# Patient Record
Sex: Female | Born: 1945 | Race: Black or African American | Hispanic: No | Smoking: Never smoker
Health system: Southern US, Community
[De-identification: ages and names within clinical notes are randomized; demographics above are authoritative.]

## PROBLEM LIST (undated history)

## (undated) DIAGNOSIS — E7800 Pure hypercholesterolemia, unspecified: Secondary | ICD-10-CM

## (undated) DIAGNOSIS — N763 Subacute and chronic vulvitis: Secondary | ICD-10-CM

## (undated) DIAGNOSIS — L209 Atopic dermatitis, unspecified: Secondary | ICD-10-CM

## (undated) DIAGNOSIS — IMO0002 Reserved for concepts with insufficient information to code with codable children: Secondary | ICD-10-CM

## (undated) DIAGNOSIS — D649 Anemia, unspecified: Secondary | ICD-10-CM

## (undated) DIAGNOSIS — Z5189 Encounter for other specified aftercare: Secondary | ICD-10-CM

## (undated) DIAGNOSIS — G930 Cerebral cysts: Secondary | ICD-10-CM

## (undated) DIAGNOSIS — G5600 Carpal tunnel syndrome, unspecified upper limb: Secondary | ICD-10-CM

## (undated) DIAGNOSIS — I10 Essential (primary) hypertension: Secondary | ICD-10-CM

## (undated) DIAGNOSIS — Z8619 Personal history of other infectious and parasitic diseases: Secondary | ICD-10-CM

## (undated) DIAGNOSIS — E669 Obesity, unspecified: Secondary | ICD-10-CM

## (undated) DIAGNOSIS — Z9889 Other specified postprocedural states: Secondary | ICD-10-CM

## (undated) DIAGNOSIS — Z87440 Personal history of urinary (tract) infections: Secondary | ICD-10-CM

## (undated) DIAGNOSIS — J13 Pneumonia due to Streptococcus pneumoniae: Secondary | ICD-10-CM

## (undated) HISTORY — DX: Reserved for concepts with insufficient information to code with codable children: IMO0002

## (undated) HISTORY — DX: Encounter for other specified aftercare: Z51.89

## (undated) HISTORY — DX: Cerebral cysts: G93.0

## (undated) HISTORY — DX: Pure hypercholesterolemia, unspecified: E78.00

## (undated) HISTORY — DX: Atopic dermatitis, unspecified: L20.9

## (undated) HISTORY — DX: Anemia, unspecified: D64.9

## (undated) HISTORY — DX: Personal history of other infectious and parasitic diseases: Z86.19

## (undated) HISTORY — DX: Personal history of urinary (tract) infections: Z87.440

## (undated) HISTORY — DX: Obesity, unspecified: E66.9

## (undated) HISTORY — DX: Subacute and chronic vulvitis: N76.3

## (undated) HISTORY — DX: Other specified postprocedural states: Z98.890

## (undated) HISTORY — PX: WRIST SURGERY: SHX841

## (undated) HISTORY — DX: Pneumonia due to Streptococcus pneumoniae: J13

---

## 2006-07-28 ENCOUNTER — Other Ambulatory Visit: Admission: RE | Admit: 2006-07-28 | Discharge: 2006-07-28 | Payer: Self-pay | Admitting: Obstetrics and Gynecology

## 2008-08-29 DIAGNOSIS — IMO0002 Reserved for concepts with insufficient information to code with codable children: Secondary | ICD-10-CM

## 2008-08-29 HISTORY — DX: Reserved for concepts with insufficient information to code with codable children: IMO0002

## 2008-12-16 HISTORY — PX: COLPOSCOPY: SHX161

## 2012-03-23 ENCOUNTER — Emergency Department (INDEPENDENT_AMBULATORY_CARE_PROVIDER_SITE_OTHER): Payer: No Typology Code available for payment source

## 2012-03-23 ENCOUNTER — Encounter (HOSPITAL_BASED_OUTPATIENT_CLINIC_OR_DEPARTMENT_OTHER): Payer: Self-pay | Admitting: *Deleted

## 2012-03-23 ENCOUNTER — Emergency Department (HOSPITAL_BASED_OUTPATIENT_CLINIC_OR_DEPARTMENT_OTHER)
Admission: EM | Admit: 2012-03-23 | Discharge: 2012-03-23 | Disposition: A | Discharge status: Home / Self Care | Payer: No Typology Code available for payment source | Attending: Emergency Medicine | Admitting: Emergency Medicine

## 2012-03-23 DIAGNOSIS — Z79899 Other long term (current) drug therapy: Secondary | ICD-10-CM | POA: Insufficient documentation

## 2012-03-23 DIAGNOSIS — J986 Disorders of diaphragm: Secondary | ICD-10-CM

## 2012-03-23 DIAGNOSIS — E789 Disorder of lipoprotein metabolism, unspecified: Secondary | ICD-10-CM | POA: Insufficient documentation

## 2012-03-23 DIAGNOSIS — R0789 Other chest pain: Secondary | ICD-10-CM

## 2012-03-23 DIAGNOSIS — R079 Chest pain, unspecified: Secondary | ICD-10-CM | POA: Insufficient documentation

## 2012-03-23 DIAGNOSIS — M129 Arthropathy, unspecified: Secondary | ICD-10-CM | POA: Insufficient documentation

## 2012-03-23 DIAGNOSIS — I517 Cardiomegaly: Secondary | ICD-10-CM

## 2012-03-23 HISTORY — DX: Pure hypercholesterolemia, unspecified: E78.00

## 2012-03-23 MED ORDER — HYDROCODONE-ACETAMINOPHEN 5-500 MG PO TABS: 1.0000 | ORAL_TABLET | Freq: Four times a day (QID) | ORAL | Status: AC | PRN

## 2012-03-23 MED ORDER — HYDROCODONE-ACETAMINOPHEN 5-325 MG PO TABS
1.0000 | ORAL_TABLET | Freq: Once | ORAL | Status: DC
  Filled 2012-03-23: qty 1
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
## 2012-03-23 NOTE — ED Provider Notes (Signed)
Medical screening examination/treatment/procedure(s) were performed by non-physician practitioner and as supervising physician I was immediately available for consultation/collaboration.  Toy Baker, MD 03/23/12 [phone]

## 2012-03-23 NOTE — Discharge Instructions (Signed)
Chest Wall Pain Chest wall pain is pain in or around the bones and muscles of your chest. It may take up to 6 weeks to get better. It may take longer if you must stay physically active in your work and activities.  CAUSES  Chest wall pain may happen on its own. However, it may be caused by:  A viral illness like the flu.   Injury.   Coughing.   Exercise.   Arthritis.   Fibromyalgia.   Shingles.  HOME CARE INSTRUCTIONS   Avoid overtiring physical activity. Try not to strain or perform activities that cause pain. This includes any activities using your chest or your abdominal and side muscles, especially if heavy weights are used.   Put ice on the sore area.   Put ice in a plastic bag.   Place a towel between your skin and the bag.   Leave the ice on for 15 to 20 minutes per hour while awake for the first 2 days.   Only take over-the-counter or prescription medicines for pain, discomfort, or fever as directed by your caregiver.  SEEK IMMEDIATE MEDICAL CARE IF:   Your pain increases, or you are very uncomfortable.   You have a fever.   Your chest pain becomes worse.   You have new, unexplained symptoms.   You have nausea or vomiting.   You feel sweaty or lightheaded.   You have a cough with phlegm (sputum), or you cough up blood.  MAKE SURE YOU:   Understand these instructions.   Will watch your condition.   Will get help right away if you are not doing well or get worse.  Document Released: 11/01/2005 Document Revised: 10/21/2011 Document Reviewed: 06/28/2011 [employer] Patient Information 2012 [employer]. 
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
## 2012-11-17 DIAGNOSIS — Z87440 Personal history of urinary (tract) infections: Secondary | ICD-10-CM | POA: Insufficient documentation

## 2012-11-17 DIAGNOSIS — L209 Atopic dermatitis, unspecified: Secondary | ICD-10-CM | POA: Insufficient documentation

## 2012-11-17 DIAGNOSIS — J13 Pneumonia due to Streptococcus pneumoniae: Secondary | ICD-10-CM | POA: Insufficient documentation

## 2012-11-17 DIAGNOSIS — E7800 Pure hypercholesterolemia, unspecified: Secondary | ICD-10-CM | POA: Insufficient documentation

## 2012-11-17 DIAGNOSIS — N763 Subacute and chronic vulvitis: Secondary | ICD-10-CM | POA: Insufficient documentation

## 2012-11-22 ENCOUNTER — Ambulatory Visit: Payer: Medicare Other | Admitting: Obstetrics and Gynecology

## 2012-11-23 ENCOUNTER — Ambulatory Visit: Payer: Medicare Other | Admitting: Obstetrics and Gynecology

## 2013-01-09 ENCOUNTER — Ambulatory Visit: Payer: Medicare Other | Admitting: Obstetrics and Gynecology

## 2013-01-09 ENCOUNTER — Encounter: Payer: Self-pay | Admitting: Obstetrics and Gynecology

## 2013-01-09 VITALS — BP 160/82 | Ht 65.0 in | Wt 301.0 lb

## 2013-01-09 DIAGNOSIS — Z87898 Personal history of other specified conditions: Secondary | ICD-10-CM

## 2013-01-09 NOTE — Progress Notes (Signed)
The patient is not taking hormone replacement therapy The patient  is taking a Calcium supplement. Post-menopausal bleeding:no  Last Pap:11/15/2011 Normal Last mammogram: per pt 10/2012 Normal Last DEXA scan : T= -0.2   11/15/2011 Last colonoscopy:normal per pt 2009 Repeat 10 years   Urinary symptoms: No Normal bowel movements: Yes but has bloating and gas Reports abuse at home: No:   Subjective:    Jenny Martin is a 67 y.o. female G1P1001 who presents for annual exam.  The patient has no complaints today.   The following portions of the patient's history were reviewed and updated as appropriate: allergies, current medications, past family history, past medical history, past social history, past surgical history and problem list.  Review of Systems Pertinent items are noted in HPI. Gastrointestinal:No change in bowel habits, no abdominal pain, no rectal bleeding Genitourinary:negative for dysuria, frequency, hematuria, nocturia and urinary incontinence    Objective:     BP 160/82  Ht 5\' 5"  (1.651 m)  Wt 301 lb (136.533 kg)  BMI 50.09 kg/m2 Wt Readings from Last 1 Encounters:  No data found for Wt     BMI: Body mass index is 50.09 kg/(m^2). General Appearance: Alert, appropriate appearance for age. No acute distress HEENT: Grossly normal Neck / Thyroid: Supple, no masses, nodes or enlargement Lungs: clear to auscultation bilaterally Back: No CVA tenderness Breast Exam: No masses or nodes.No dimpling, nipple retraction or discharge. Cardiovascular: Regular rate and rhythm. S1, S2, no murmur Gastrointestinal: Soft, non-tender, no masses or organomegaly Pelvic Exam: Vulva and vagina appear normal. Bimanual exam reveals normal uterus and adnexa. Rectovaginal: normal rectal, no masses Lymphatic Exam: Non-palpable nodes in neck, clavicular, axillary, or inguinal regions  Skin: no rash or abnormalities Neurologic: Normal gait and speech, no tremor  Psychiatric: Alert and  oriented, appropriate affect.   Assessment:    Normal gyn exam    Plan:   mammogram pap smear return annually or prn DEXA next year Visit with PCP for Weight Management   Silverio Lay MD

## 2013-07-20 ENCOUNTER — Encounter (HOSPITAL_BASED_OUTPATIENT_CLINIC_OR_DEPARTMENT_OTHER): Payer: Self-pay | Admitting: *Deleted

## 2013-07-20 ENCOUNTER — Emergency Department (HOSPITAL_BASED_OUTPATIENT_CLINIC_OR_DEPARTMENT_OTHER)
Admission: EM | Admit: 2013-07-20 | Discharge: 2013-07-20 | Disposition: A | Discharge status: Home / Self Care | Payer: Medicare Other | Attending: Emergency Medicine | Admitting: Emergency Medicine

## 2013-07-20 DIAGNOSIS — Z872 Personal history of diseases of the skin and subcutaneous tissue: Secondary | ICD-10-CM | POA: Insufficient documentation

## 2013-07-20 DIAGNOSIS — Z87448 Personal history of other diseases of urinary system: Secondary | ICD-10-CM | POA: Insufficient documentation

## 2013-07-20 DIAGNOSIS — Z8619 Personal history of other infectious and parasitic diseases: Secondary | ICD-10-CM | POA: Insufficient documentation

## 2013-07-20 DIAGNOSIS — Z79899 Other long term (current) drug therapy: Secondary | ICD-10-CM | POA: Insufficient documentation

## 2013-07-20 DIAGNOSIS — Z8639 Personal history of other endocrine, nutritional and metabolic disease: Secondary | ICD-10-CM | POA: Insufficient documentation

## 2013-07-20 DIAGNOSIS — R21 Rash and other nonspecific skin eruption: Secondary | ICD-10-CM | POA: Insufficient documentation

## 2013-07-20 DIAGNOSIS — Z862 Personal history of diseases of the blood and blood-forming organs and certain disorders involving the immune mechanism: Secondary | ICD-10-CM | POA: Insufficient documentation

## 2013-07-20 DIAGNOSIS — E669 Obesity, unspecified: Secondary | ICD-10-CM | POA: Insufficient documentation

## 2013-07-20 DIAGNOSIS — Z8701 Personal history of pneumonia (recurrent): Secondary | ICD-10-CM | POA: Insufficient documentation

## 2013-07-20 MED ORDER — HYDROCORTISONE 0.5 % EX CREA: TOPICAL_CREAM | Freq: Two times a day (BID) | CUTANEOUS | Status: DC

## 2013-07-20 MED ORDER — CEPHALEXIN 250 MG PO CAPS: 250.0000 mg | ORAL_CAPSULE | Freq: Four times a day (QID) | ORAL | Status: DC

## 2013-07-20 NOTE — ED Provider Notes (Signed)
CSN: [id number]     Arrival date & time 07/20/13  1527 History   First MD Initiated Contact with Patient 07/20/13 1531     Chief Complaint  Patient presents with  . Rash   (Consider location/radiation/quality/duration/timing/severity/associated sxs/prior Treatment) HPI Patient presents to the emergency room with complaints of a rash that started about a day ago on her left forehead above her eye. She states that it is mildly uncomfortable but not particularly painful. She deniesAny itching. She has not any trouble with with fever or visual disturbances. She denies any drainage. Patient is not aware of any new medications or creams. She denies any recent hair treatments.   The patient was concerned about this and she is being a serious infection so she came to the emergency room.  She denies any other systemic symptoms such as fevers, chills, shortness of breath, myalgias or arthralgias. She has not had any recent trips or travel. Past Medical History  Diagnosis Date  . High cholesterol   . Blood transfusion without reported diagnosis   . Anemia   . H/O bladder infections   . Pneumonia, pneumococcal   . Atopic dermatitis   . Chronic vulvitis   . Hypercholesterolemia   . S/P thoracotomy   . Arachnoid cyst   . History of chicken pox   . Obese   . Abnormal Pap smear 08/29/2008   Past Surgical History  Procedure Laterality Date  . Wrist surgery    . Colposcopy  12/2008    normal   Family History  Problem Relation Age of Onset  . COPD Mother   . Stroke Father    History  Substance Use Topics  . Smoking status: Never Smoker   . Smokeless tobacco: Never Used  . Alcohol Use: No   OB History   Grav Para Term Preterm Abortions TAB SAB Ect Mult Living   1 1 1       1      Review of Systems  All other systems reviewed and are negative.    Allergies  Ibuprofen  Home Medications   Current Outpatient Rx  Name  Route  Sig  Dispense  Refill  . acetaminophen (TYLENOL) 500 MG  tablet   Oral   Take 500 mg by mouth every 6 (six) hours as needed. For pain         . aspirin 325 MG tablet   Oral   Take 325 mg by mouth once. When felt chest tightness         . Calcium Citrate-Vitamin D (CITRACAL/VITAMIN D PO)   Oral   Take 2 tablets by mouth daily.         . cephALEXin (KEFLEX) 250 MG capsule   Oral   Take 1 capsule (250 mg total) by mouth 4 (four) times daily.   28 capsule   0   . hydrocortisone cream 0.5 %   Topical   Apply topically 2 (two) times daily.   30 g   0   . KRILL OIL OMEGA-3 PO   Oral   Take 1 tablet by mouth.         Marland Kitchen PRESCRIPTION MEDICATION   Inhalation   Inhale 2 puffs into the lungs. For SOB from bronchitis  (2 unknown inhalers- sample packs from MD)          BP 169/84  Pulse 77  Temp(Src) 97.9 F (36.6 C) (Oral)  Resp 20  Ht 5\' 6"  (1.676 m)  Wt 300 lb (  136.079 kg)  BMI 48.44 kg/m2  SpO2 98% Physical Exam  Nursing note and vitals reviewed. Constitutional: She appears well-developed and well-nourished. No distress.  HENT:  Head: Normocephalic and atraumatic.    Right Ear: External ear normal.  Left Ear: External ear normal.  Macular rash in the left forehead, no vesicles noted  Eyes: Conjunctivae are normal. Right eye exhibits no discharge. Left eye exhibits no discharge. No scleral icterus.  Neck: Neck supple. No tracheal deviation present.  Cardiovascular: Normal rate and regular rhythm.   No murmur heard. Pulmonary/Chest: Effort normal and breath sounds normal. No stridor. No respiratory distress. She has no wheezes.  Musculoskeletal: She exhibits no tenderness.  Neurological: She is alert. Cranial nerve deficit: no gross deficits.  Skin: Skin is warm and dry. Rash noted. No purpura noted. Rash is macular. Rash is not papular, not nodular, not pustular, not vesicular and not urticarial. There is erythema.  Psychiatric: She has a normal mood and affect.    ED Course  Procedures (including critical  care time) Labs Review Labs Reviewed - No data to display Imaging review No results found.  MDM   1. Rash    patient appears well and nontoxic,  this does not appear to be consistent with shingles. Possible she could have an early cellulitis although the patient is not having any significant pain. Atopic dermatitis but this isn't an unusual location for her. We'll have her take anti-inflammatory cream, hydrocortisone and have her take a course of Keflex for the possibility of an early cellulitis. Patient is instructed to monitor for blistering lesions. She is to followup with her doctor next week    Celene Kras, MD 07/20/13 [phone]

## 2013-07-20 NOTE — ED Notes (Signed)
Pt c/o rash to left side of face above left eye x 2 days

## 2013-12-27 ENCOUNTER — Encounter (HOSPITAL_BASED_OUTPATIENT_CLINIC_OR_DEPARTMENT_OTHER): Payer: Self-pay | Admitting: Emergency Medicine

## 2013-12-27 ENCOUNTER — Emergency Department (HOSPITAL_BASED_OUTPATIENT_CLINIC_OR_DEPARTMENT_OTHER)
Admission: EM | Admit: 2013-12-27 | Discharge: 2013-12-27 | Disposition: A | Discharge status: Home / Self Care | Payer: Medicare Other | Attending: Emergency Medicine | Admitting: Emergency Medicine

## 2013-12-27 ENCOUNTER — Emergency Department (HOSPITAL_BASED_OUTPATIENT_CLINIC_OR_DEPARTMENT_OTHER): Payer: Medicare Other

## 2013-12-27 DIAGNOSIS — IMO0002 Reserved for concepts with insufficient information to code with codable children: Secondary | ICD-10-CM | POA: Insufficient documentation

## 2013-12-27 DIAGNOSIS — Z872 Personal history of diseases of the skin and subcutaneous tissue: Secondary | ICD-10-CM | POA: Insufficient documentation

## 2013-12-27 DIAGNOSIS — Z8742 Personal history of other diseases of the female genital tract: Secondary | ICD-10-CM | POA: Insufficient documentation

## 2013-12-27 DIAGNOSIS — Z8701 Personal history of pneumonia (recurrent): Secondary | ICD-10-CM | POA: Insufficient documentation

## 2013-12-27 DIAGNOSIS — J069 Acute upper respiratory infection, unspecified: Secondary | ICD-10-CM

## 2013-12-27 DIAGNOSIS — Z9889 Other specified postprocedural states: Secondary | ICD-10-CM | POA: Insufficient documentation

## 2013-12-27 DIAGNOSIS — Z79899 Other long term (current) drug therapy: Secondary | ICD-10-CM | POA: Insufficient documentation

## 2013-12-27 DIAGNOSIS — E7800 Pure hypercholesterolemia, unspecified: Secondary | ICD-10-CM | POA: Insufficient documentation

## 2013-12-27 DIAGNOSIS — E669 Obesity, unspecified: Secondary | ICD-10-CM | POA: Insufficient documentation

## 2013-12-27 DIAGNOSIS — Z862 Personal history of diseases of the blood and blood-forming organs and certain disorders involving the immune mechanism: Secondary | ICD-10-CM | POA: Insufficient documentation

## 2013-12-27 DIAGNOSIS — Z87440 Personal history of urinary (tract) infections: Secondary | ICD-10-CM | POA: Insufficient documentation

## 2013-12-27 DIAGNOSIS — Z8619 Personal history of other infectious and parasitic diseases: Secondary | ICD-10-CM | POA: Insufficient documentation

## 2013-12-27 HISTORY — DX: Acute upper respiratory infection, unspecified: J06.9

## 2013-12-27 MED ORDER — AZITHROMYCIN 250 MG PO TABS: ORAL_TABLET | ORAL | Status: DC

## 2013-12-27 NOTE — ED Notes (Signed)
Pt amb to room 5 with quick steady gait in nad. Pt reports cough and congestion x 1 week. Pt states she has had surgery on her left lung in the past, and is concerned that she may have a pneumonia. Pt states her cough produces yellow sputum. Pt reports some subjective temps over the last week off and on. Pt states that she took a course of amoxicillin a week ago for "a stye on my right eyelid."

## 2013-12-27 NOTE — ED Provider Notes (Signed)
CSN: [id number]     Arrival date & time 12/27/13  4098 History   First MD Initiated Contact with Patient 12/27/13 [phone]     Chief Complaint  Patient presents with  . Nasal Congestion  . Cough     (Consider location/radiation/quality/duration/timing/severity/associated sxs/prior Treatment) Patient is a 68 y.o. female presenting with URI. The history is provided by the patient.  URI Presenting symptoms: congestion, cough, rhinorrhea and sore throat (mild, now gone)   Presenting symptoms: no fatigue and no fever   Congestion:    Location:  Nasal Cough:    Cough characteristics:  Productive   Sputum characteristics:  Yellow   Severity:  Mild   Onset quality:  Gradual   Duration:  1 week   Timing:  Constant   Progression:  Unchanged   Chronicity:  New Severity:  Mild Associated symptoms: no headaches and no neck pain     Past Medical History  Diagnosis Date  . High cholesterol   . Blood transfusion without reported diagnosis   . Anemia   . H/O bladder infections   . Pneumonia, pneumococcal   . Atopic dermatitis   . Chronic vulvitis   . Hypercholesterolemia   . S/P thoracotomy   . Arachnoid cyst   . History of chicken pox   . Obese   . Abnormal Pap smear 08/29/2008   Past Surgical History  Procedure Laterality Date  . Wrist surgery    . Colposcopy  12/2008    normal   Family History  Problem Relation Age of Onset  . COPD Mother   . Stroke Father    History  Substance Use Topics  . Smoking status: Never Smoker   . Smokeless tobacco: Never Used  . Alcohol Use: No   OB History   Grav Para Term Preterm Abortions TAB SAB Ect Mult Living   1 1 1       1      Review of Systems  Constitutional: Negative for fever and fatigue.  HENT: Positive for congestion, rhinorrhea and sore throat (mild, now gone). Negative for drooling.   Eyes: Negative for pain.  Respiratory: Positive for cough. Negative for shortness of breath.   Cardiovascular: Negative for chest pain.   Gastrointestinal: Negative for nausea, vomiting, abdominal pain and diarrhea.  Genitourinary: Negative for dysuria and hematuria.  Musculoskeletal: Negative for back pain, gait problem and neck pain.  Skin: Negative for color change.  Neurological: Negative for dizziness and headaches.  Hematological: Negative for adenopathy.  Psychiatric/Behavioral: Negative for behavioral problems.  All other systems reviewed and are negative.      Allergies  Ibuprofen  Home Medications   Current Outpatient Rx  Name  Route  Sig  Dispense  Refill  . acetaminophen (TYLENOL) 500 MG tablet   Oral   Take 500 mg by mouth every 6 (six) hours as needed. For pain         . aspirin 325 MG tablet   Oral   Take 325 mg by mouth once. When felt chest tightness         . Calcium Citrate-Vitamin D (CITRACAL/VITAMIN D PO)   Oral   Take 2 tablets by mouth daily.         . cephALEXin (KEFLEX) 250 MG capsule   Oral   Take 1 capsule (250 mg total) by mouth 4 (four) times daily.   28 capsule   0   . hydrocortisone cream 0.5 %   Topical   Apply topically 2 (  two) times daily.   30 g   0   . KRILL OIL OMEGA-3 PO   Oral   Take 1 tablet by mouth.         Marland Kitchen PRESCRIPTION MEDICATION   Inhalation   Inhale 2 puffs into the lungs. For SOB from bronchitis  (2 unknown inhalers- sample packs from MD)          BP 160/66  Pulse 69  Temp(Src) 98.6 F (37 C) (Oral)  Resp 18  Ht 5\' 5"  (1.651 m)  Wt 280 lb (127.007 kg)  BMI 46.59 kg/m2  SpO2 98% Physical Exam  Nursing note and vitals reviewed. Constitutional: She is oriented to person, place, and time. She appears well-developed and well-nourished.  HENT:  Head: Normocephalic.  Mouth/Throat: Oropharynx is clear and moist. No oropharyngeal exudate.  No cervical adenopathy noted.   Eyes: Conjunctivae and EOM are normal. Pupils are equal, round, and reactive to light.  Neck: Normal range of motion. Neck supple.  Cardiovascular: Normal rate,  regular rhythm, normal heart sounds and intact distal pulses.  Exam reveals no gallop and no friction rub.   No murmur heard. Pulmonary/Chest: Effort normal and breath sounds normal. No respiratory distress. She has no wheezes.  Abdominal: Soft. Bowel sounds are normal. There is no tenderness. There is no rebound and no guarding.  Musculoskeletal: Normal range of motion. She exhibits edema (mild non-pitting edema in bilateral LE's). She exhibits no tenderness.  Neurological: She is alert and oriented to person, place, and time.  Skin: Skin is warm and dry.  Psychiatric: She has a normal mood and affect. Her behavior is normal.    ED Course  Procedures (including critical care time) Labs Review Labs Reviewed - No data to display Imaging Review Dg Chest 2 View  12/27/2013   CLINICAL DATA:  Cough.  EXAM: CHEST  2 VIEW  COMPARISON:  Mar 23, 2012.  FINDINGS: Stable cardiomegaly. Stable elevation of left hemidiaphragm. No pleural effusion or pneumothorax is noted. No acute pulmonary disease is noted. Bony thorax is intact.  IMPRESSION: No active cardiopulmonary disease.   Electronically Signed   By: Roque Lias M.D.   On: 12/27/2013 08:57    EKG Interpretation   None       MDM   Final diagnoses:  Viral URI    8:31 AM 68 y.o. female who presents with nasal congestion and cough productive of your sputum for approximately one week. She notes subjective fever and occasional chills. She denies any shortness of breath. She does feel some pain in her left chest when she coughs but not otherwise. She states that she has a history of a left-sided thoracotomy with partial lung resection when she was 68 years old due to PNA. The patient is afebrile and vital signs are unremarkable here. She appears well on exam. Will get screening chest x-ray.  9:15 AM: CXR neg. D/t hx of thoracotomy, age, self reported hx of living w/ smokers I think it is reasonable to tx w/ Azithro.  I have discussed the  diagnosis/risks/treatment options with the patient and believe the pt to be eligible for discharge home to follow-up with pcp as needed. We also discussed returning to the ED immediately if new or worsening sx occur. We discussed the sx which are most concerning (e.g., worsening cough, fever, sob, cp) that necessitate immediate return. Medications administered to the patient during their visit and any new prescriptions provided to the patient are listed below.  Medications given during  this visit Medications - No data to display  New Prescriptions   AZITHROMYCIN (ZITHROMAX Z-PAK) 250 MG TABLET    2 po day one, then 1 daily x 4 days     Junius ArgyleForrest S Samanatha Brammer, MD 12/27/13 [phone]

## 2013-12-27 NOTE — Discharge Instructions (Signed)
Cough, Adult  A cough is a reflex. It helps you clear your throat and airways. A cough can help heal your body. A cough can last 2 or 3 weeks (acute) or may last more than 8 weeks (chronic). Some common causes of a cough can include an infection, allergy, or a cold. HOME CARE  Only take medicine as told by your doctor.  If given, take your medicines (antibiotics) as told. Finish them even if you start to feel better.  Use a cold steam vaporizer or humidier in your home. This can help loosen thick spit (secretions).  Sleep so you are almost sitting up (semi-upright). Use pillows to do this. This helps reduce coughing.  Rest as needed.  Stop smoking if you smoke. GET HELP RIGHT AWAY IF:  You have yellowish-white fluid (pus) in your thick spit.  Your cough gets worse.  Your medicine does not reduce coughing, and you are losing sleep.  You cough up blood.  You have trouble breathing.  Your pain gets worse and medicine does not help.  You have a fever. MAKE SURE YOU:   Understand these instructions.  Will watch your condition.  Will get help right away if you are not doing well or get worse. Document Released: 07/15/2011 Document Revised: 01/24/2012 Document Reviewed: 07/15/2011 [employer] Patient Information 2014 [employer].  

## 2014-09-16 ENCOUNTER — Encounter (HOSPITAL_BASED_OUTPATIENT_CLINIC_OR_DEPARTMENT_OTHER): Payer: Self-pay | Admitting: Emergency Medicine

## 2014-09-17 ENCOUNTER — Emergency Department (HOSPITAL_BASED_OUTPATIENT_CLINIC_OR_DEPARTMENT_OTHER): Payer: Medicare Other

## 2014-09-17 ENCOUNTER — Emergency Department (HOSPITAL_BASED_OUTPATIENT_CLINIC_OR_DEPARTMENT_OTHER)
Admission: EM | Admit: 2014-09-17 | Discharge: 2014-09-17 | Disposition: A | Discharge status: Home / Self Care | Payer: Medicare Other | Attending: Emergency Medicine | Admitting: Emergency Medicine

## 2014-09-17 ENCOUNTER — Encounter (HOSPITAL_BASED_OUTPATIENT_CLINIC_OR_DEPARTMENT_OTHER): Payer: Self-pay

## 2014-09-17 DIAGNOSIS — J189 Pneumonia, unspecified organism: Secondary | ICD-10-CM

## 2014-09-17 DIAGNOSIS — J159 Unspecified bacterial pneumonia: Secondary | ICD-10-CM | POA: Insufficient documentation

## 2014-09-17 DIAGNOSIS — E669 Obesity, unspecified: Secondary | ICD-10-CM | POA: Insufficient documentation

## 2014-09-17 DIAGNOSIS — Z7952 Long term (current) use of systemic steroids: Secondary | ICD-10-CM | POA: Diagnosis not present

## 2014-09-17 DIAGNOSIS — R059 Cough, unspecified: Secondary | ICD-10-CM

## 2014-09-17 DIAGNOSIS — Z792 Long term (current) use of antibiotics: Secondary | ICD-10-CM | POA: Insufficient documentation

## 2014-09-17 DIAGNOSIS — Z8639 Personal history of other endocrine, nutritional and metabolic disease: Secondary | ICD-10-CM | POA: Insufficient documentation

## 2014-09-17 DIAGNOSIS — Z8669 Personal history of other diseases of the nervous system and sense organs: Secondary | ICD-10-CM | POA: Diagnosis not present

## 2014-09-17 DIAGNOSIS — Z87448 Personal history of other diseases of urinary system: Secondary | ICD-10-CM | POA: Diagnosis not present

## 2014-09-17 DIAGNOSIS — R05 Cough: Secondary | ICD-10-CM | POA: Diagnosis present

## 2014-09-17 DIAGNOSIS — Z8619 Personal history of other infectious and parasitic diseases: Secondary | ICD-10-CM | POA: Insufficient documentation

## 2014-09-17 DIAGNOSIS — Z7982 Long term (current) use of aspirin: Secondary | ICD-10-CM | POA: Insufficient documentation

## 2014-09-17 DIAGNOSIS — Z872 Personal history of diseases of the skin and subcutaneous tissue: Secondary | ICD-10-CM | POA: Diagnosis not present

## 2014-09-17 DIAGNOSIS — Z862 Personal history of diseases of the blood and blood-forming organs and certain disorders involving the immune mechanism: Secondary | ICD-10-CM | POA: Diagnosis not present

## 2014-09-17 MED ORDER — DOXYCYCLINE HYCLATE 100 MG PO CAPS: 100.0000 mg | ORAL_CAPSULE | Freq: Two times a day (BID) | ORAL | Status: DC

## 2014-09-17 MED ORDER — GUAIFENESIN-CODEINE 100-10 MG/5ML PO SOLN: 10.0000 mL | Freq: Four times a day (QID) | ORAL | Status: DC | PRN

## 2014-09-17 NOTE — Discharge Instructions (Signed)
Doxycycline as prescribed.  Robitussin with codeine as needed for cough.  Return to the emergency department if you develop severe chest pain or difficulty breathing.   Pneumonia Pneumonia is an infection of the lungs.  CAUSES Pneumonia may be caused by bacteria or a virus. Usually, these infections are caused by breathing infectious particles into the lungs (respiratory tract). SIGNS AND SYMPTOMS   Cough.  Fever.  Chest pain.  Increased rate of breathing.  Wheezing.  Mucus production. DIAGNOSIS  If you have the common symptoms of pneumonia, your health care provider will typically confirm the diagnosis with a chest X-ray. The X-ray will show an abnormality in the lung (pulmonary infiltrate) if you have pneumonia. Other tests of your blood, urine, or sputum may be done to find the specific cause of your pneumonia. Your health care provider may also do tests (blood gases or pulse oximetry) to see how well your lungs are working. TREATMENT  Some forms of pneumonia may be spread to other people when you cough or sneeze. You may be asked to wear a mask before and during your exam. Pneumonia that is caused by bacteria is treated with antibiotic medicine. Pneumonia that is caused by the influenza virus may be treated with an antiviral medicine. Most other viral infections must run their course. These infections will not respond to antibiotics.  HOME CARE INSTRUCTIONS   Cough suppressants may be used if you are losing too much rest. However, coughing protects you by clearing your lungs. You should avoid using cough suppressants if you can.  Your health care provider may have prescribed medicine if he or she thinks your pneumonia is caused by bacteria or influenza. Finish your medicine even if you start to feel better.  Your health care provider may also prescribe an expectorant. This loosens the mucus to be coughed up.  Take medicines only as directed by your health care provider.  Do  not smoke. Smoking is a common cause of bronchitis and can contribute to pneumonia. If you are a smoker and continue to smoke, your cough may last several weeks after your pneumonia has cleared.  A cold steam vaporizer or humidifier in your room or home may help loosen mucus.  Coughing is often worse at night. Sleeping in a semi-upright position in a recliner or using a couple pillows under your head will help with this.  Get rest as you feel it is needed. Your body will usually let you know when you need to rest. PREVENTION A pneumococcal shot (vaccine) is available to prevent a common bacterial cause of pneumonia. This is usually suggested for:  People over 66 years old.  Patients on chemotherapy.  People with chronic lung problems, such as bronchitis or emphysema.  People with immune system problems. If you are over 65 or have a high risk condition, you may receive the pneumococcal vaccine if you have not received it before. In some countries, a routine influenza vaccine is also recommended. This vaccine can help prevent some cases of pneumonia.You may be offered the influenza vaccine as part of your care. If you smoke, it is time to quit. You may receive instructions on how to stop smoking. Your health care provider can provide medicines and counseling to help you quit. SEEK MEDICAL CARE IF: You have a fever. SEEK IMMEDIATE MEDICAL CARE IF:   Your illness becomes worse. This is especially true if you are elderly or weakened from any other disease.  You cannot control your cough with suppressants  and are losing sleep.  You begin coughing up blood.  You develop pain which is getting worse or is uncontrolled with medicines.  Any of the symptoms which initially brought you in for treatment are getting worse rather than better.  You develop shortness of breath or chest pain. MAKE SURE YOU:   Understand these instructions.  Will watch your condition.  Will get help right away  if you are not doing well or get worse. Document Released: 11/01/2005 Document Revised: 03/18/2014 Document Reviewed: 01/21/2011 [facility] Patient Information 2015 [place], [place]. This information is not intended to replace advice given to you by your health care provider. Make sure you discuss any questions you have with your health care provider.

## 2014-09-17 NOTE — ED Notes (Signed)
Cough and congestion x 3 days

## 2014-09-17 NOTE — ED Provider Notes (Signed)
CSN: [id number]     Arrival date & time 09/17/14  1115 History   First MD Initiated Contact with Patient 09/17/14 1158     Chief Complaint  Patient presents with  . Cough     (Consider location/radiation/quality/duration/timing/severity/associated sxs/prior Treatment) Patient is a 68 y.o. female presenting with cough. The history is provided by the patient.  Cough Cough characteristics:  Productive Sputum characteristics:  Clear Severity:  Moderate Onset quality:  Gradual Duration:  3 days Timing:  Constant Progression:  Worsening Chronicity:  New Smoker: no   Relieved by:  Nothing Worsened by:  Nothing tried Ineffective treatments:  None tried Associated symptoms: sinus congestion   Associated symptoms: no chest pain, no fever and no sore throat     Past Medical History  Diagnosis Date  . High cholesterol   . Blood transfusion without reported diagnosis   . Anemia   . H/O bladder infections   . Pneumonia, pneumococcal   . Atopic dermatitis   . Chronic vulvitis   . Hypercholesterolemia   . S/P thoracotomy   . Arachnoid cyst   . History of chicken pox   . Obese   . Abnormal Pap smear 08/29/2008   Past Surgical History  Procedure Laterality Date  . Wrist surgery    . Colposcopy  12/2008    normal   Family History  Problem Relation Age of Onset  . COPD Mother   . Stroke Father    History  Substance Use Topics  . Smoking status: Never Smoker   . Smokeless tobacco: Never Used  . Alcohol Use: No   OB History    Gravida Para Term Preterm AB TAB SAB Ectopic Multiple Living   1 1 1       1      Review of Systems  Constitutional: Negative for fever.  HENT: Negative for sore throat.   Respiratory: Positive for cough.   Cardiovascular: Negative for chest pain.  All other systems reviewed and are negative.     Allergies  Ibuprofen  Home Medications   Prior to Admission medications   Medication Sig Start Date End Date Taking? Authorizing Provider   acetaminophen (TYLENOL) 500 MG tablet Take 500 mg by mouth every 6 (six) hours as needed. For pain    Historical Provider, MD  aspirin 325 MG tablet Take 325 mg by mouth once. When felt chest tightness    Historical Provider, MD  azithromycin (ZITHROMAX Z-PAK) 250 MG tablet 2 po day one, then 1 daily x 4 days 12/27/13   Purvis SheffieldForrest Harrison, MD  Calcium Citrate-Vitamin D (CITRACAL/VITAMIN D PO) Take 2 tablets by mouth daily.    Historical Provider, MD  cephALEXin (KEFLEX) 250 MG capsule Take 1 capsule (250 mg total) by mouth 4 (four) times daily. 07/20/13   Linwood DibblesJon Knapp, MD  hydrocortisone cream 0.5 % Apply topically 2 (two) times daily. 07/20/13   Linwood DibblesJon Knapp, MD  KRILL OIL OMEGA-3 PO Take 1 tablet by mouth.    Historical Provider, MD  PRESCRIPTION MEDICATION Inhale 2 puffs into the lungs. For SOB from bronchitis  (2 unknown inhalers- sample packs from MD)    Historical Provider, MD   BP 167/67 mmHg  Pulse 81  Temp(Src) 98.4 F (36.9 C) (Oral)  Resp 20  Ht 5\' 5"  (1.651 m)  Wt 298 lb (135.172 kg)  BMI 49.59 kg/m2  SpO2 97% Physical Exam  Constitutional: She is oriented to person, place, and time. She appears well-developed and well-nourished. No distress.  HENT:  Head: Normocephalic and atraumatic.  Mouth/Throat: Oropharynx is clear and moist.  Neck: Normal range of motion. Neck supple.  Cardiovascular: Normal rate and regular rhythm.  Exam reveals no gallop and no friction rub.   No murmur heard. Pulmonary/Chest: Effort normal and breath sounds normal. No respiratory distress. She has no wheezes. She has no rales. She exhibits no tenderness.  Abdominal: Soft. Bowel sounds are normal. She exhibits no distension. There is no tenderness.  Musculoskeletal: Normal range of motion. She exhibits no edema.  Lymphadenopathy:    She has no cervical adenopathy.  Neurological: She is alert and oriented to person, place, and time.  Skin: Skin is warm and dry. She is not diaphoretic.  Nursing note and  vitals reviewed.   ED Course  Procedures (including critical care time) Labs Review Labs Reviewed - No data to display  Imaging Review No results found.   EKG Interpretation None      MDM   Final diagnoses:  Cough    Patient presents with chest congestion and URI like symptoms for the past several and has been coughing up phlegm. Her chest x-ray reveals what could potentially be the onset of a pneumonia in the right lung base. I will treat with Zithromax and cough medication. She is to return as needed if her symptoms worsen or change.    Geoffery Lyonsouglas Shanon Seawright, MD 09/17/14 1213

## 2015-12-10 DIAGNOSIS — R609 Edema, unspecified: Secondary | ICD-10-CM | POA: Insufficient documentation

## 2015-12-10 DIAGNOSIS — H5203 Hypermetropia, bilateral: Secondary | ICD-10-CM

## 2015-12-10 DIAGNOSIS — J309 Allergic rhinitis, unspecified: Secondary | ICD-10-CM

## 2015-12-10 DIAGNOSIS — K5909 Other constipation: Secondary | ICD-10-CM | POA: Insufficient documentation

## 2015-12-10 DIAGNOSIS — D704 Cyclic neutropenia: Secondary | ICD-10-CM | POA: Insufficient documentation

## 2015-12-10 DIAGNOSIS — H18413 Arcus senilis, bilateral: Secondary | ICD-10-CM

## 2015-12-10 DIAGNOSIS — E785 Hyperlipidemia, unspecified: Secondary | ICD-10-CM | POA: Insufficient documentation

## 2015-12-10 HISTORY — DX: Hyperlipidemia, unspecified: E78.5

## 2015-12-10 HISTORY — DX: Arcus senilis, bilateral: H18.413

## 2015-12-10 HISTORY — DX: Cyclic neutropenia: D70.4

## 2015-12-10 HISTORY — DX: Edema, unspecified: R60.9

## 2015-12-10 HISTORY — DX: Hypermetropia, bilateral: H52.03

## 2015-12-10 HISTORY — DX: Allergic rhinitis, unspecified: J30.9

## 2015-12-16 DIAGNOSIS — R7989 Other specified abnormal findings of blood chemistry: Secondary | ICD-10-CM

## 2015-12-16 DIAGNOSIS — J4520 Mild intermittent asthma, uncomplicated: Secondary | ICD-10-CM

## 2015-12-16 DIAGNOSIS — Z23 Encounter for immunization: Secondary | ICD-10-CM | POA: Insufficient documentation

## 2015-12-16 HISTORY — DX: Other specified abnormal findings of blood chemistry: R79.89

## 2015-12-16 HISTORY — DX: Mild intermittent asthma, uncomplicated: J45.20

## 2015-12-16 HISTORY — DX: Encounter for immunization: Z23

## 2016-04-10 ENCOUNTER — Emergency Department (HOSPITAL_BASED_OUTPATIENT_CLINIC_OR_DEPARTMENT_OTHER)
Admission: EM | Admit: 2016-04-10 | Discharge: 2016-04-11 | Disposition: A | Discharge status: Home / Self Care | Payer: Medicare Other | Attending: Emergency Medicine | Admitting: Emergency Medicine

## 2016-04-10 ENCOUNTER — Encounter (HOSPITAL_BASED_OUTPATIENT_CLINIC_OR_DEPARTMENT_OTHER): Payer: Self-pay | Admitting: Emergency Medicine

## 2016-04-10 ENCOUNTER — Emergency Department (HOSPITAL_BASED_OUTPATIENT_CLINIC_OR_DEPARTMENT_OTHER): Payer: Medicare Other

## 2016-04-10 DIAGNOSIS — R42 Dizziness and giddiness: Secondary | ICD-10-CM | POA: Insufficient documentation

## 2016-04-10 DIAGNOSIS — Z79899 Other long term (current) drug therapy: Secondary | ICD-10-CM | POA: Diagnosis not present

## 2016-04-10 DIAGNOSIS — J209 Acute bronchitis, unspecified: Secondary | ICD-10-CM

## 2016-04-10 DIAGNOSIS — R0981 Nasal congestion: Secondary | ICD-10-CM | POA: Diagnosis present

## 2016-04-10 NOTE — ED Notes (Signed)
PAtient states that she is having some nasal congestion and dizziness starting today.

## 2016-04-10 NOTE — ED Provider Notes (Signed)
CSN: [id number]     Arrival date & time 04/10/16  2255 History  By signing my name below, I, Doreatha MartinEva Mathews and Bridgette HabermannMaria Tan, attest that this documentation has been prepared under the direction and in the presence of Geoffery Lyonsouglas Tayshun Gappa, MD. Electronically Signed: Doreatha MartinEva Mathews and Bridgette HabermannMaria Tan, ED Scribe. 04/10/2016. 12:17 AM.   Chief Complaint  Patient presents with  . Dizziness    Patient is a 70 y.o. female presenting with URI. The history is provided by the patient. No language interpreter was used.  URI Presenting symptoms: congestion, rhinorrhea and sore throat   Severity:  Moderate Onset quality:  Gradual Duration:  1 day Timing:  Constant Progression:  Unchanged Chronicity:  New Relieved by:  Decongestant, inhaler and OTC medications Worsened by:  Nothing tried Ineffective treatments:  Rest Associated symptoms: no neck pain   Risk factors: no chronic respiratory disease and no sick contacts     HPI Comments: Jenny Martin is a 70 y.o. female with a history of left PNX, streptococcus PNA who presents to the Emergency Department complaining of congestion onset today. Patient reports associated sore throat, chills, lightheadedness, rhinorrhea. Patient has tried Tylenol, ASA and inhaler with moderate relief. Patient is a non-smoker. No h/o COPD, emphysema, CHF, MI, CAD. Patient denies chest pain and shortness of breath.  Past Medical History  Diagnosis Date  . High cholesterol   . Blood transfusion without reported diagnosis   . Anemia   . H/O bladder infections   . Pneumonia, pneumococcal (HCC)   . Atopic dermatitis   . Chronic vulvitis   . Hypercholesterolemia   . S/P thoracotomy   . Arachnoid cyst   . History of chicken pox   . Obese   . Abnormal Pap smear 08/29/2008   Past Surgical History  Procedure Laterality Date  . Wrist surgery    . Colposcopy  12/2008    normal   Family History  Problem Relation Age of Onset  . COPD Mother   . Stroke Father    Social History   Substance Use Topics  . Smoking status: Never Smoker   . Smokeless tobacco: Never Used  . Alcohol Use: No   OB History    Gravida Para Term Preterm AB TAB SAB Ectopic Multiple Living   1 1 1       1      Review of Systems  Constitutional: Positive for chills.  HENT: Positive for congestion, rhinorrhea and sore throat.   Respiratory: Negative for shortness of breath.   Cardiovascular: Negative for chest pain.  Musculoskeletal: Negative for neck pain.  Neurological: Positive for light-headedness.  All other systems reviewed and are negative.    Allergies  Ibuprofen  Home Medications   Prior to Admission medications   Medication Sig Start Date End Date Taking? Authorizing Provider  fluticasone (FLONASE) 50 MCG/ACT nasal spray Place 1 spray into both nostrils daily.   Yes Historical Provider, MD  fluticasone-salmeterol (ADVAIR HFA) 115-21 MCG/ACT inhaler Inhale 2 puffs into the lungs 2 (two) times daily.   Yes Historical Provider, MD  acetaminophen (TYLENOL) 500 MG tablet Take 500 mg by mouth every 6 (six) hours as needed. For pain    Historical Provider, MD  aspirin 325 MG tablet Take 325 mg by mouth once. When felt chest tightness    Historical Provider, MD  azithromycin (ZITHROMAX Z-PAK) 250 MG tablet 2 po day one, then 1 daily x 4 days 04/11/16   Geoffery Lyonsouglas Tyese Finken, MD  Calcium Citrate-Vitamin D (CITRACAL/VITAMIN  D PO) Take 2 tablets by mouth daily.    Historical Provider, MD  cephALEXin (KEFLEX) 250 MG capsule Take 1 capsule (250 mg total) by mouth 4 (four) times daily. 07/20/13   Linwood Dibbles, MD  doxycycline (VIBRAMYCIN) 100 MG capsule Take 1 capsule (100 mg total) by mouth 2 (two) times daily. 09/17/14   Geoffery Lyons, MD  guaiFENesin-codeine 100-10 MG/5ML syrup Take 10 mLs by mouth every 6 (six) hours as needed for cough. 09/17/14   Geoffery Lyons, MD  hydrocortisone cream 0.5 % Apply topically 2 (two) times daily. 07/20/13   Linwood Dibbles, MD  KRILL OIL OMEGA-3 PO Take 1 tablet by mouth.     Historical Provider, MD  PRESCRIPTION MEDICATION Inhale 2 puffs into the lungs. For SOB from bronchitis  (2 unknown inhalers- sample packs from MD)    Historical Provider, MD   BP 146/79 mmHg  Pulse 90  Temp(Src) 100.4 F (38 C) (Oral)  Resp 18  Ht 5' (1.524 m)  Wt 282 lb (127.914 kg)  BMI 55.07 kg/m2  SpO2 98% Physical Exam  Constitutional: She is oriented to person, place, and time. She appears well-developed and well-nourished.  HENT:  Head: Normocephalic.  Mouth/Throat: Oropharynx is clear and moist. No oropharyngeal exudate.  Eyes: Conjunctivae are normal.  Neck: Normal range of motion.  Cardiovascular: Normal rate, regular rhythm and normal heart sounds.   No murmur heard. Pulmonary/Chest: Effort normal and breath sounds normal. No respiratory distress. She has no wheezes. She has no rales.  Lungs CTA bilaterally   Abdominal: Soft. She exhibits no distension.  Musculoskeletal: Normal range of motion.  Neurological: She is alert and oriented to person, place, and time.  Skin: Skin is warm and dry.  Psychiatric: She has a normal mood and affect. Her behavior is normal.  Nursing note and vitals reviewed.   ED Course  Procedures (including critical care time) DIAGNOSTIC STUDIES: Oxygen Saturation is 98% on room air, normal by my interpretation.    COORDINATION OF CARE: 11:48 PM Discussed treatment plan with pt which includes CXR and an Rx of antibiotics at bedside and pt agreed to plan.   Labs Review Labs Reviewed - No data to display  Imaging Review Dg Chest 2 View  04/11/2016  CLINICAL DATA:  Cough and dizziness.  Nasal congestion. EXAM: CHEST  2 VIEW COMPARISON:  09/17/2014 FINDINGS: Stable elevation of left hemidiaphragm. Stable cardiomediastinal contours. No focal airspace opacity. Previous right infrahilar opacity has resolved. No pulmonary edema, pleural effusion or pneumothorax. Intact osseous structures. IMPRESSION: No acute process.  Stable elevation of left  hemidiaphragm. Electronically Signed   By: Rubye Oaks M.D.   On: 04/11/2016 00:15   I have personally reviewed and evaluated these images and lab results as part of my medical decision-making.   EKG Interpretation None      MDM   Final diagnoses:  Acute bronchitis, unspecified organism    Patient presents with complaints of chest congestion, cough, and feeling dizzy for the past several days. She has a low-grade fever and reports fevers at home. Her chest x-ray is negative for pneumonia. Her symptoms sound like bronchitis. She will be treated with Zithromax and alternating Tylenol and Motrin. To return as needed for any problems.  I personally performed the services described in this documentation, which was scribed in my presence. The recorded information has been reviewed and is accurate.       Geoffery Lyons, MD 04/11/16 [id number]

## 2016-04-11 MED ORDER — AZITHROMYCIN 250 MG PO TABS: ORAL_TABLET | ORAL | Status: DC

## 2016-04-11 NOTE — Discharge Instructions (Signed)
Zithromax as prescribed.  Tylenol 1000 mg rotated with Motrin 600 mg every 4 hours as needed for pain.  Return to the ER if symptoms significantly worsen or change.   Acute Bronchitis Bronchitis is inflammation of the airways that extend from the windpipe into the lungs (bronchi). The inflammation often causes mucus to develop. This leads to a cough, which is the most common symptom of bronchitis.  In acute bronchitis, the condition usually develops suddenly and goes away over time, usually in a couple weeks. Smoking, allergies, and asthma can make bronchitis worse. Repeated episodes of bronchitis may cause further lung problems.  CAUSES Acute bronchitis is most often caused by the same virus that causes a cold. The virus can spread from person to person (contagious) through coughing, sneezing, and touching contaminated objects. SIGNS AND SYMPTOMS   Cough.   Fever.   Coughing up mucus.   Body aches.   Chest congestion.   Chills.   Shortness of breath.   Sore throat.  DIAGNOSIS  Acute bronchitis is usually diagnosed through a physical exam. Your health care provider will also ask you questions about your medical history. Tests, such as chest X-rays, are sometimes done to rule out other conditions.  TREATMENT  Acute bronchitis usually goes away in a couple weeks. Oftentimes, no medical treatment is necessary. Medicines are sometimes given for relief of fever or cough. Antibiotic medicines are usually not needed but may be prescribed in certain situations. In some cases, an inhaler may be recommended to help reduce shortness of breath and control the cough. A cool mist vaporizer may also be used to help thin bronchial secretions and make it easier to clear the chest.  HOME CARE INSTRUCTIONS  Get plenty of rest.   Drink enough fluids to keep your urine clear or pale yellow (unless you have a medical condition that requires fluid restriction). Increasing fluids may help thin  your respiratory secretions (sputum) and reduce chest congestion, and it will prevent dehydration.   Take medicines only as directed by your health care provider.  If you were prescribed an antibiotic medicine, finish it all even if you start to feel better.  Avoid smoking and secondhand smoke. Exposure to cigarette smoke or irritating chemicals will make bronchitis worse. If you are a smoker, consider using nicotine gum or skin patches to help control withdrawal symptoms. Quitting smoking will help your lungs heal faster.   Reduce the chances of another bout of acute bronchitis by washing your hands frequently, avoiding people with cold symptoms, and trying not to touch your hands to your mouth, nose, or eyes.   Keep all follow-up visits as directed by your health care provider.  SEEK MEDICAL CARE IF: Your symptoms do not improve after 1 week of treatment.  SEEK IMMEDIATE MEDICAL CARE IF:  You develop an increased fever or chills.   You have chest pain.   You have severe shortness of breath.  You have bloody sputum.   You develop dehydration.  You faint or repeatedly feel like you are going to pass out.  You develop repeated vomiting.  You develop a severe headache. MAKE SURE YOU:   Understand these instructions.  Will watch your condition.  Will get help right away if you are not doing well or get worse.   This information is not intended to replace advice given to you by your health care provider. Make sure you discuss any questions you have with your health care provider.   Document Released: 12/09/2004  Document Revised: 11/22/2014 Document Reviewed: 04/24/2013 Elsevier Interactive Patient Education [employer].

## 2016-04-11 NOTE — ED Notes (Signed)
Pt given d/c instructions as per chart. Rx x 1. Verbalizes understanding. No questions. 

## 2016-10-20 ENCOUNTER — Emergency Department (HOSPITAL_BASED_OUTPATIENT_CLINIC_OR_DEPARTMENT_OTHER): Payer: Medicare Other

## 2016-10-20 ENCOUNTER — Encounter (HOSPITAL_BASED_OUTPATIENT_CLINIC_OR_DEPARTMENT_OTHER): Payer: Self-pay | Admitting: *Deleted

## 2016-10-20 ENCOUNTER — Emergency Department (HOSPITAL_BASED_OUTPATIENT_CLINIC_OR_DEPARTMENT_OTHER)
Admission: EM | Admit: 2016-10-20 | Discharge: 2016-10-21 | Disposition: A | Discharge status: Home / Self Care | Payer: Medicare Other | Attending: Emergency Medicine | Admitting: Emergency Medicine

## 2016-10-20 DIAGNOSIS — R110 Nausea: Secondary | ICD-10-CM | POA: Insufficient documentation

## 2016-10-20 DIAGNOSIS — R0602 Shortness of breath: Secondary | ICD-10-CM | POA: Diagnosis present

## 2016-10-20 DIAGNOSIS — Z7982 Long term (current) use of aspirin: Secondary | ICD-10-CM | POA: Insufficient documentation

## 2016-10-20 DIAGNOSIS — R609 Edema, unspecified: Secondary | ICD-10-CM | POA: Diagnosis not present

## 2016-10-20 DIAGNOSIS — J189 Pneumonia, unspecified organism: Secondary | ICD-10-CM | POA: Insufficient documentation

## 2016-10-20 DIAGNOSIS — R1013 Epigastric pain: Secondary | ICD-10-CM | POA: Diagnosis not present

## 2016-10-20 LAB — CBC WITH DIFFERENTIAL/PLATELET
Basophils Absolute: 0 10*3/uL (ref 0.0–0.1)
Basophils Relative: 0 %
EOS ABS: 0.1 10*3/uL (ref 0.0–0.7)
EOS PCT: 1 %
HCT: 32.3 % — ABNORMAL LOW (ref 36.0–46.0)
Hemoglobin: 10.7 g/dL — ABNORMAL LOW (ref 12.0–15.0)
LYMPHS ABS: 0.7 10*3/uL (ref 0.7–4.0)
LYMPHS PCT: 7 %
MCH: 29.4 pg (ref 26.0–34.0)
MCHC: 33.1 g/dL (ref 30.0–36.0)
MCV: 88.7 fL (ref 78.0–100.0)
MONO ABS: 1 10*3/uL (ref 0.1–1.0)
Monocytes Relative: 9 %
Neutro Abs: 8.8 10*3/uL — ABNORMAL HIGH (ref 1.7–7.7)
Neutrophils Relative %: 83 %
PLATELETS: 196 10*3/uL (ref 150–400)
RBC: 3.64 MIL/uL — ABNORMAL LOW (ref 3.87–5.11)
RDW: 13 % (ref 11.5–15.5)
WBC: 10.6 10*3/uL — AB (ref 4.0–10.5)

## 2016-10-20 LAB — COMPREHENSIVE METABOLIC PANEL
ALT: 20 U/L (ref 14–54)
ANION GAP: 9 (ref 5–15)
AST: 30 U/L (ref 15–41)
Albumin: 3.4 g/dL — ABNORMAL LOW (ref 3.5–5.0)
Alkaline Phosphatase: 43 U/L (ref 38–126)
BUN: 27 mg/dL — ABNORMAL HIGH (ref 6–20)
CHLORIDE: 100 mmol/L — AB (ref 101–111)
CO2: 26 mmol/L (ref 22–32)
CREATININE: 0.86 mg/dL (ref 0.44–1.00)
Calcium: 9.6 mg/dL (ref 8.9–10.3)
Glucose, Bld: 101 mg/dL — ABNORMAL HIGH (ref 65–99)
POTASSIUM: 3.8 mmol/L (ref 3.5–5.1)
SODIUM: 135 mmol/L (ref 135–145)
Total Bilirubin: 0.3 mg/dL (ref 0.3–1.2)
Total Protein: 7.8 g/dL (ref 6.5–8.1)

## 2016-10-20 LAB — LIPASE, BLOOD: LIPASE: 16 U/L (ref 11–51)

## 2016-10-20 LAB — URINALYSIS, ROUTINE W REFLEX MICROSCOPIC
BILIRUBIN URINE: NEGATIVE
Glucose, UA: NEGATIVE mg/dL
KETONES UR: NEGATIVE mg/dL
Leukocytes, UA: NEGATIVE
Nitrite: NEGATIVE
Protein, ur: NEGATIVE mg/dL
SPECIFIC GRAVITY, URINE: 1.011 (ref 1.005–1.030)
pH: 5 (ref 5.0–8.0)

## 2016-10-20 LAB — URINALYSIS, MICROSCOPIC (REFLEX): Bacteria, UA: NONE SEEN

## 2016-10-20 LAB — D-DIMER, QUANTITATIVE (NOT AT ARMC): D DIMER QUANT: 0.67 ug{FEU}/mL — AB (ref 0.00–0.50)

## 2016-10-20 LAB — TROPONIN I

## 2016-10-20 LAB — BRAIN NATRIURETIC PEPTIDE: B NATRIURETIC PEPTIDE 5: 73.5 pg/mL (ref 0.0–100.0)

## 2016-10-20 LAB — CK: CK TOTAL: 273 U/L — AB (ref 38–234)

## 2016-10-20 MED ORDER — DEXTROSE 5 % IV SOLN
1.0000 g | Freq: Once | INTRAVENOUS | Status: AC
  Administered 2016-10-20: 1 g via INTRAVENOUS
  Filled 2016-10-20: qty 10

## 2016-10-20 MED ORDER — AZITHROMYCIN 250 MG PO TABS: 250.0000 mg | ORAL_TABLET | Freq: Every day | ORAL | 0 refills | Status: DC

## 2016-10-20 MED ORDER — DEXTROSE 5 % IV SOLN
500.0000 mg | Freq: Once | INTRAVENOUS | Status: AC
  Administered 2016-10-20: 500 mg via INTRAVENOUS

## 2016-10-20 MED ORDER — AZITHROMYCIN 500 MG IV SOLR
INTRAVENOUS | Status: AC
  Filled 2016-10-20: qty 500

## 2016-10-20 MED ORDER — IOPAMIDOL (ISOVUE-370) INJECTION 76%
100.0000 mL | Freq: Once | INTRAVENOUS | Status: AC | PRN
  Administered 2016-10-20: 100 mL via INTRAVENOUS

## 2016-10-20 NOTE — ED Notes (Signed)
Pt receiving IV abx. Will discharge upon completion of abx.

## 2016-10-20 NOTE — ED Notes (Signed)
Pt states she took a 325 ASA around 6pm

## 2016-10-20 NOTE — ED Notes (Signed)
Family at bedside. 

## 2016-10-20 NOTE — ED Provider Notes (Signed)
[facility] Provider Note   CSN: [id number] Arrival date & time: 10/20/16  2029   By signing my name below, I, Clovis PuAvnee Patel, attest that this documentation has been prepared under the direction and in the presence of Loren Raceravid Phebe Dettmer, MD  Electronically Signed: Clovis PuAvnee Patel, ED Scribe. 10/20/16. 9:03 PM.   History   Chief Complaint Chief Complaint  Patient presents with  . Foot Swelling  . Shortness of Breath   The history is provided by the patient. No language interpreter was used.   HPI Comments:  Jenny Martin is a 70 y.o. female who presents to the Emergency Department complaining of moderate bilateral lower extremity swelling x 2 weeks. Pt states she ate a salad today at 4 PM after which she felt lightheaded, dizzy and mildly nauseous. She also endorses left-sided chest tightness, SOB (resolved), a mild cough, chills, mild left arm pain and difficulty urinating. Her chest tightness is worse with deep breathing. She has taken an antiacid with no relief. Pt denies vomiting, any other associated symptoms and modifying factors at this time.    Past Medical History:  Diagnosis Date  . Abnormal Pap smear 08/29/2008  . Anemia   . Arachnoid cyst   . Atopic dermatitis   . Blood transfusion without reported diagnosis   . Chronic vulvitis   . H/O bladder infections   . High cholesterol   . History of chicken pox   . Hypercholesterolemia   . Obese   . Pneumonia, pneumococcal (HCC)   . S/P thoracotomy     Patient Active Problem List   Diagnosis Date Noted  . Viral URI 12/27/2013  . Morbid obesity with BMI of 50.0-59.9, adult (HCC) 01/09/2013  . History of abnormal Pap smear 01/09/2013  . H/O bladder infections   . Pneumonia, pneumococcal (HCC)   . Atopic dermatitis   . Chronic vulvitis   . Hypercholesterolemia     Past Surgical History:  Procedure Laterality Date  . COLPOSCOPY  12/2008   normal  . WRIST SURGERY      OB History    Gravida Para Term  Preterm AB Living   1 1 1     1    SAB TAB Ectopic Multiple Live Births           1       Home Medications    Prior to Admission medications   Medication Sig Start Date End Date Taking? Authorizing Provider  acetaminophen (TYLENOL) 500 MG tablet Take 500 mg by mouth every 6 (six) hours as needed. For pain    Historical Provider, MD  aspirin 325 MG tablet Take 325 mg by mouth once. When felt chest tightness    Historical Provider, MD  azithromycin (ZITHROMAX) 250 MG tablet Take 1 tablet (250 mg total) by mouth daily. Take first 2 tablets together, then 1 every day until finished. 10/21/16   Loren Raceravid Johnaton Sonneborn, MD  Calcium Citrate-Vitamin D (CITRACAL/VITAMIN D PO) Take 2 tablets by mouth daily.    Historical Provider, MD  cephALEXin (KEFLEX) 250 MG capsule Take 1 capsule (250 mg total) by mouth 4 (four) times daily. 07/20/13   Linwood DibblesJon Knapp, MD  doxycycline (VIBRAMYCIN) 100 MG capsule Take 1 capsule (100 mg total) by mouth 2 (two) times daily. 09/17/14   Geoffery Lyonsouglas Delo, MD  fluticasone (FLONASE) 50 MCG/ACT nasal spray Place 1 spray into both nostrils daily.    Historical Provider, MD  fluticasone-salmeterol (ADVAIR HFA) 115-21 MCG/ACT inhaler Inhale 2 puffs into the lungs 2 (two)  times daily.    Historical Provider, MD  guaiFENesin-codeine 100-10 MG/5ML syrup Take 10 mLs by mouth every 6 (six) hours as needed for cough. 09/17/14   Geoffery Lyons, MD  hydrocortisone cream 0.5 % Apply topically 2 (two) times daily. 07/20/13   Linwood Dibbles, MD  KRILL OIL OMEGA-3 PO Take 1 tablet by mouth.    Historical Provider, MD  PRESCRIPTION MEDICATION Inhale 2 puffs into the lungs. For SOB from bronchitis  (2 unknown inhalers- sample packs from MD)    Historical Provider, MD    Family History Family History  Problem Relation Age of Onset  . COPD Mother   . Stroke Father     Social History Social History  Substance Use Topics  . Smoking status: Never Smoker  . Smokeless tobacco: Never Used  . Alcohol use No      Allergies   Ibuprofen   Review of Systems Review of Systems  Constitutional: Positive for chills. Negative for fatigue and fever.  HENT: Positive for trouble swallowing. Negative for congestion, facial swelling, sore throat and voice change.   Eyes: Negative for visual disturbance.  Respiratory: Positive for cough, chest tightness and shortness of breath. Negative for wheezing.   Cardiovascular: Positive for chest pain and leg swelling. Negative for palpitations.  Gastrointestinal: Positive for abdominal pain and nausea. Negative for constipation, diarrhea and vomiting.  Genitourinary: Negative for dysuria, flank pain and frequency.  Musculoskeletal: Negative for back pain, myalgias, neck pain and neck stiffness.  Skin: Negative for rash and wound.  Neurological: Positive for dizziness and light-headedness. Negative for syncope, speech difficulty, weakness, numbness and headaches.  All other systems reviewed and are negative.    Physical Exam Updated Vital Signs BP 146/67 (BP Location: Left Arm)   Pulse 85   Temp 100.6 F (38.1 C) (Oral)   Resp 20   Ht 5\' 5"  (1.651 m)   Wt 282 lb (127.9 kg)   SpO2 97%   BMI 46.93 kg/m   Physical Exam  Constitutional: She is oriented to person, place, and time. She appears well-developed and well-nourished. No distress.  HENT:  Head: Normocephalic and atraumatic.  Mouth/Throat: Oropharynx is clear and moist. No oropharyngeal exudate.  Eyes: EOM are normal. Pupils are equal, round, and reactive to light.  Neck: Normal range of motion. Neck supple.  Murmur appreciated in the left carotid. She has a pulsatile mass on the right anterior neck. States it is unchanged  Cardiovascular: Normal rate, regular rhythm and intact distal pulses.   Murmur (holosystolic murmur) heard. Pulmonary/Chest: Effort normal and breath sounds normal. No respiratory distress. She has no wheezes. She has no rales. She exhibits no tenderness.  Abdominal: Soft.  Bowel sounds are normal. There is tenderness (mild epigastric tenderness with palpation.). There is no rebound and no guarding.  Musculoskeletal: Normal range of motion. She exhibits no edema or tenderness.  3+ bilateral lower extremity edema. No definite asymmetry or tenderness.  Lymphadenopathy:    She has no cervical adenopathy.  Neurological: She is alert and oriented to person, place, and time.  Patient is alert and oriented x3 with clear, goal oriented speech. Patient has 5/5 motor in all extremities. Sensation is intact to light touch.   Skin: Skin is warm and dry. Capillary refill takes less than 2 seconds. No rash noted. No erythema.  Psychiatric: She has a normal mood and affect. Her behavior is normal.  Nursing note and vitals reviewed.    ED Treatments / Results  DIAGNOSTIC STUDIES:  Oxygen Saturation is 97% on RA, normal by my interpretation.    COORDINATION OF CARE:  9:03 PM Discussed treatment plan with pt at bedside and pt agreed to plan.  Labs (all labs ordered are listed, but only abnormal results are displayed) Labs Reviewed  CBC WITH DIFFERENTIAL/PLATELET - Abnormal; Notable for the following:       Result Value   WBC 10.6 (*)    RBC 3.64 (*)    Hemoglobin 10.7 (*)    HCT 32.3 (*)    Neutro Abs 8.8 (*)    All other components within normal limits  COMPREHENSIVE METABOLIC PANEL - Abnormal; Notable for the following:    Chloride 100 (*)    Glucose, Bld 101 (*)    BUN 27 (*)    Albumin 3.4 (*)    All other components within normal limits  D-DIMER, QUANTITATIVE (NOT AT [facility]) - Abnormal; Notable for the following:    D-Dimer, Quant 0.67 (*)    All other components within normal limits  URINALYSIS, ROUTINE W REFLEX MICROSCOPIC - Abnormal; Notable for the following:    Hgb urine dipstick LARGE (*)    All other components within normal limits  CK - Abnormal; Notable for the following:    Total CK 273 (*)    All other components within normal limits   URINALYSIS, MICROSCOPIC (REFLEX) - Abnormal; Notable for the following:    Squamous Epithelial / LPF 0-5 (*)    All other components within normal limits  BRAIN NATRIURETIC PEPTIDE  TROPONIN I  LIPASE, BLOOD    EKG  EKG Interpretation  Date/Time:  Wednesday October 20 2016 20:40:39 EST Ventricular Rate:  82 PR Interval:  182 QRS Duration: 96 QT Interval:  380 QTC Calculation: 443 R Axis:   16 Text Interpretation:  Normal sinus rhythm Minimal voltage criteria for LVH, may be normal variant Borderline ECG Confirmed by Ranae Palms  MD, Lyndsay Talamante ([id number]) on 10/20/2016 9:43:33 PM       Radiology Dg Chest 2 View  Result Date: 10/20/2016 CLINICAL DATA:  Fever cough and congestion for 3 days. EXAM: CHEST  2 VIEW COMPARISON:  04/10/2016 FINDINGS: Patchy opacity in the lingula may represent pneumonia. Chronic elevation of the left hemidiaphragm. Right lung is clear except for minimal curvilinear scarring in the bases. Unchanged moderate cardiomegaly. Normal pulmonary vasculature. No pleural effusions. Unremarkable hilar and mediastinal contours. IMPRESSION: Patchy lingular opacity may represent pneumonia. Unchanged cardiomegaly. No effusions. Followup PA and lateral chest X-ray is recommended in 3-4 weeks following trial of antibiotic therapy to ensure resolution and exclude underlying malignancy. Electronically Signed   By: Ellery Plunk M.D.   On: 10/20/2016 21:45   Ct Angio Chest Pe W And/or Wo Contrast  Result Date: 10/20/2016 CLINICAL DATA:  Shortness of breath and left-sided chest tightness. EXAM: CT ANGIOGRAPHY CHEST WITH CONTRAST TECHNIQUE: Multidetector CT imaging of the chest was performed using the standard protocol during bolus administration of intravenous contrast. Multiplanar CT image reconstructions and MIPs were obtained to evaluate the vascular anatomy. CONTRAST:  100 cc Isovue 370 IV COMPARISON:  Chest radiograph earlier this day. FINDINGS: Cardiovascular: There are no filling  defects within the pulmonary arteries to suggest pulmonary embolus. Special attention paid to left upper lobe pulmonary arteries, no filling defects are seen. No thoracic aortic dissection or aneurysm. Common origin of the brachiocephalic and left common carotid artery from the aortic arch, a normal variant. Mild multi chamber cardiomegaly. Mediastinum/Nodes: Small prevascular and lower paratracheal lymph nodes are likely reactive.  There is no hilar adenopathy. No axillary adenopathy. No pericardial fluid. Lungs/Pleura: Anterior left upper lobe consolidation with air bronchograms consistent with pneumonia. Scattered atelectasis elsewhere. No evidence of pulmonary edema. No pleural fluid. Upper Abdomen: Multiple hepatic cysts.  No acute abnormality. Musculoskeletal: There are no acute or suspicious osseous abnormalities. Mild degenerative change in the spine Review of the MIP images confirms the above findings. IMPRESSION: 1. No pulmonary embolus. 2. Anterior left upper lobe consolidation with air bronchograms consistent with pneumonia. As recommended on radiographs, follow-up PA and lateral chest radiographs recommended in 3-4 weeks after course of treatment to ensure clearing. Electronically Signed   By: Rubye OaksMelanie  Ehinger M.D.   On: 10/20/2016 22:38    Procedures Procedures (including critical care time)  Medications Ordered in ED Medications  azithromycin (ZITHROMAX) 500 mg in dextrose 5 % 250 mL IVPB (500 mg Intravenous New Bag/Given 10/20/16 2314)  azithromycin (ZITHROMAX) 500 MG injection (not administered)  iopamidol (ISOVUE-370) 76 % injection 100 mL (100 mLs Intravenous Contrast Given 10/20/16 2218)  cefTRIAXone (ROCEPHIN) 1 g in dextrose 5 % 50 mL IVPB (0 g Intravenous Stopped 10/20/16 2315)     Initial Impression / Assessment and Plan / ED Course  I have reviewed the triage vital signs and the nursing notes.  Pertinent labs & imaging results that were available during my care of the patient  were reviewed by me and considered in my medical decision making (see chart for details).  Clinical Course    Patient with evidence of left sided infiltrate on chest x-ray. Mild elevation in d-dimer but CT angiogram of the chest Route evidence of PE. Again demonstrates left-sided pneumonia. Patient also has mild elevation in CK. This may be due to her hyperlipidemia. She is advised to stop taking this and follow-up with her primary physician for alternative medication for her hyperlipidemia. Advised to keep legs elevated and use compression stockings for her lower extremity edema. Patient is very well-appearing. Vital signs are stable other than mild elevation in temperature. We'll give IV antibiotics and discharged home with prescription. Patient understands need to follow-up closely with her primary physician is been given strict return precautions. She agrees with plan to be discharged home.   Final Clinical Impressions(s) / ED Diagnoses   Final diagnoses:  Community acquired pneumonia, unspecified laterality  Peripheral edema    New Prescriptions New Prescriptions   AZITHROMYCIN (ZITHROMAX) 250 MG TABLET    Take 1 tablet (250 mg total) by mouth daily. Take first 2 tablets together, then 1 every day until finished.  I personally performed the services described in this documentation, which was scribed in my presence. The recorded information has been reviewed and is accurate.       Loren Raceravid Takayla Baillie, MD 10/20/16 [phone]

## 2016-10-20 NOTE — ED Triage Notes (Signed)
Pt states she has had feet swelling all week and SOB started today

## 2017-04-25 DIAGNOSIS — H43393 Other vitreous opacities, bilateral: Secondary | ICD-10-CM | POA: Insufficient documentation

## 2017-04-25 DIAGNOSIS — H2513 Age-related nuclear cataract, bilateral: Secondary | ICD-10-CM

## 2017-04-25 HISTORY — DX: Other vitreous opacities, bilateral: H43.393

## 2017-04-25 HISTORY — DX: Age-related nuclear cataract, bilateral: H25.13

## 2017-10-22 IMAGING — CT CT ANGIO CHEST
2 of 9 series · 19 of 36 positions shown · IV contrast (isovue)
Comparison: Chest radiograph earlier this day.

CLINICAL DATA: Shortness of breath and left-sided chest tightness.

EXAM:
CT ANGIOGRAPHY CHEST WITH CONTRAST
TECHNIQUE: Multidetector CT imaging of the chest was performed using the
standard protocol during bolus administration of intravenous
contrast. Multiplanar CT image reconstructions and MIPs were
obtained to evaluate the vascular anatomy.
CONTRAST:  100 cc Isovue 370 IV

[Series 7: pe coronal mpr · coronal · 0.66mm/px · 1 of 82 slices shown]
[im 41/82  mediastinal]
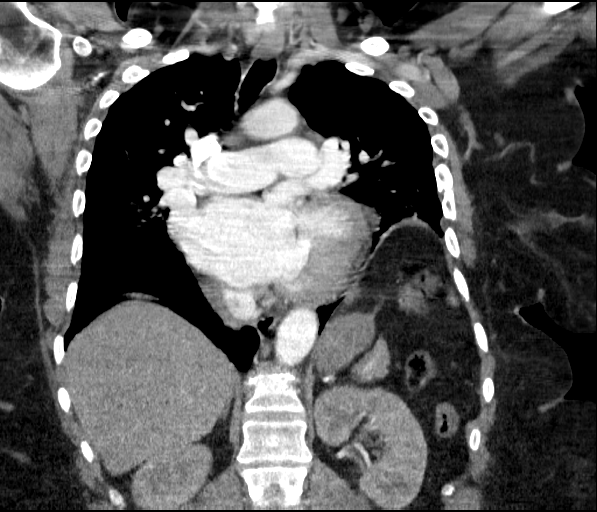

[Series 11: pe thins · axial · 0.80mm/px · z∈[-51,+248]mm · 18 of 445 slices shown]
[im 23/445  lung]
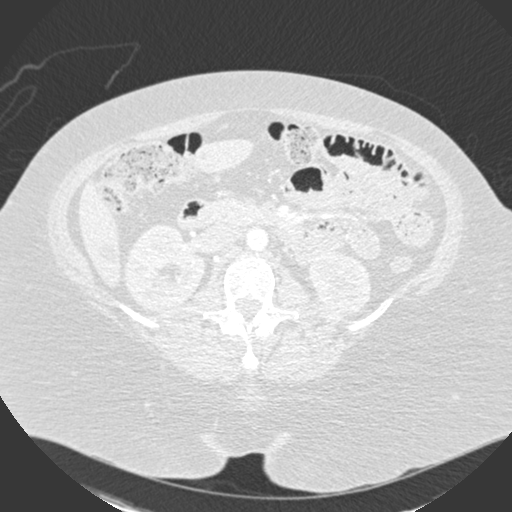
[im 45/445  mediastinal]
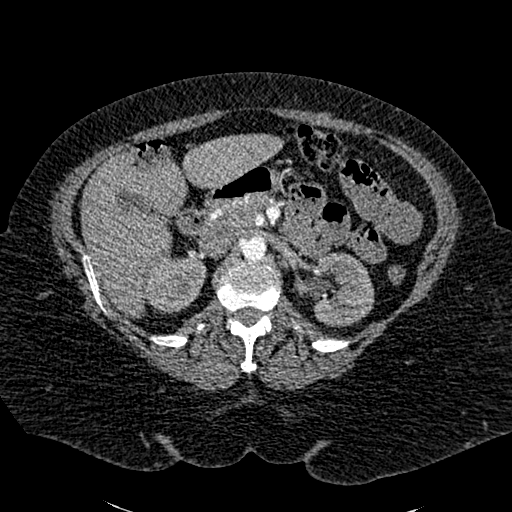
[im 67/445  lung]
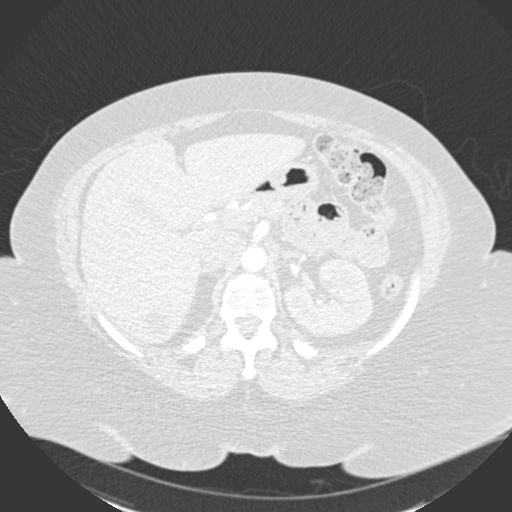
[im 89/445  mediastinal]
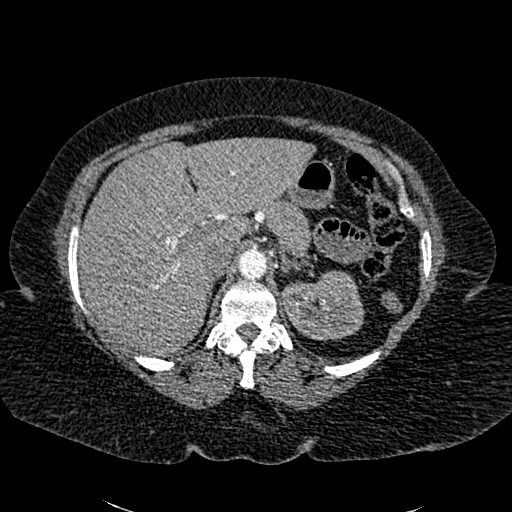
[im 112/445  lung]
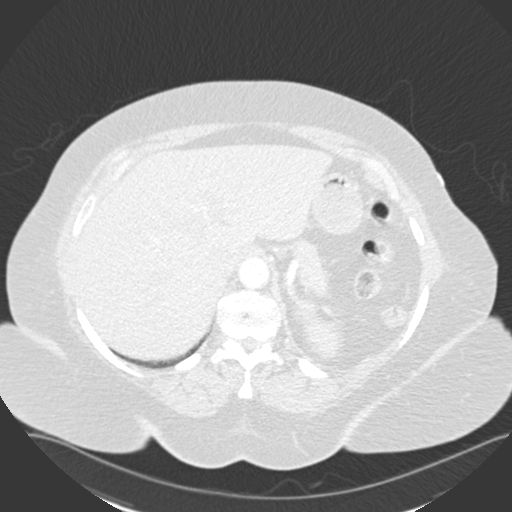
[im 134/445  mediastinal]
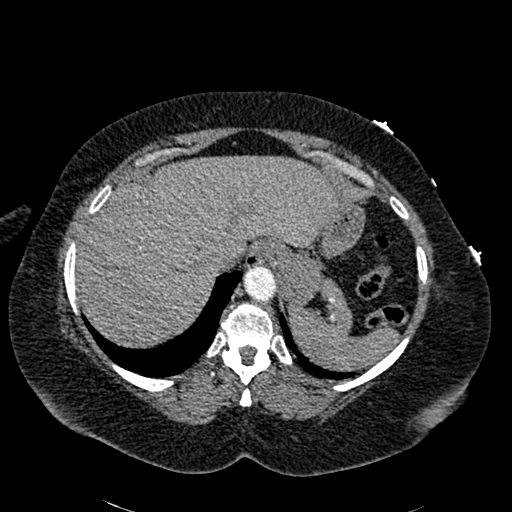
[im 156/445  lung]
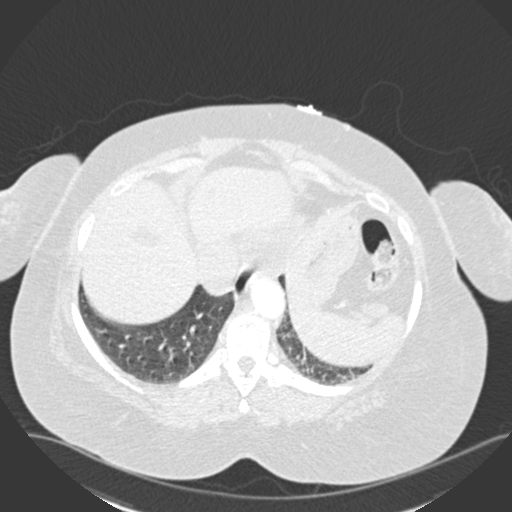
[im 178/445  mediastinal]
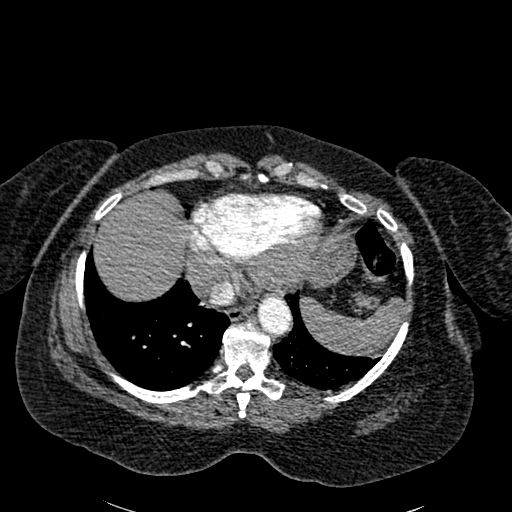
[im 200/445  lung]
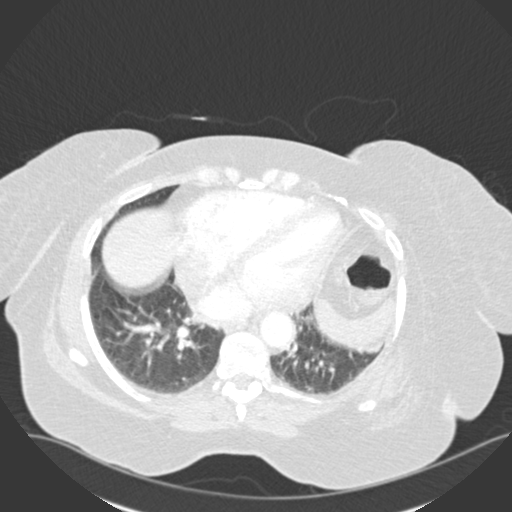
[im 245/445  mediastinal]
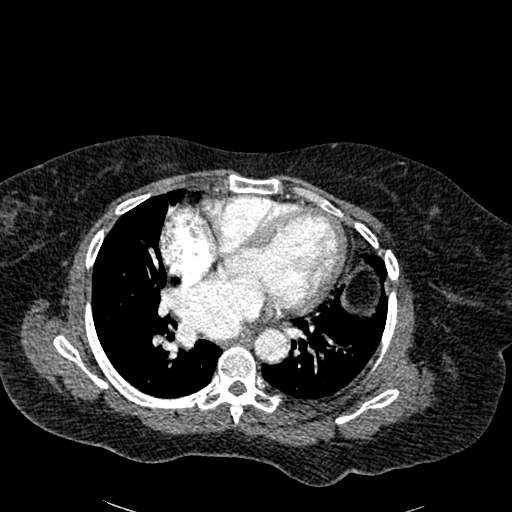
[im 267/445  lung]
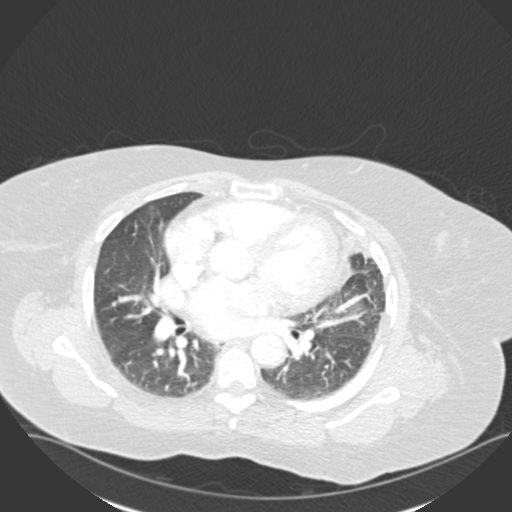
[im 289/445  mediastinal]
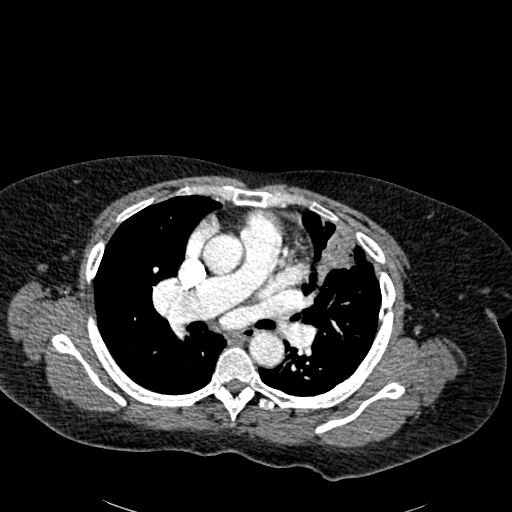
[im 311/445  lung]
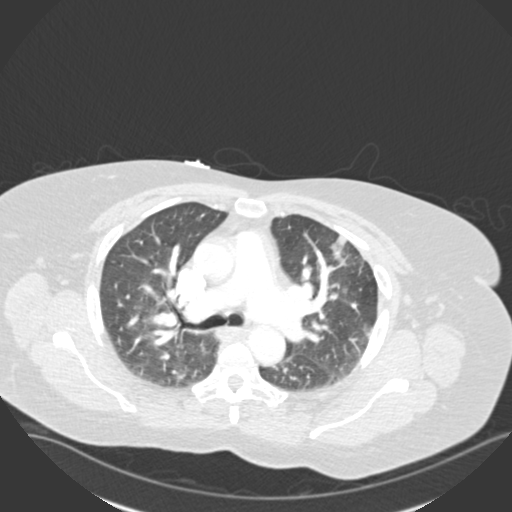
[im 334/445  mediastinal]
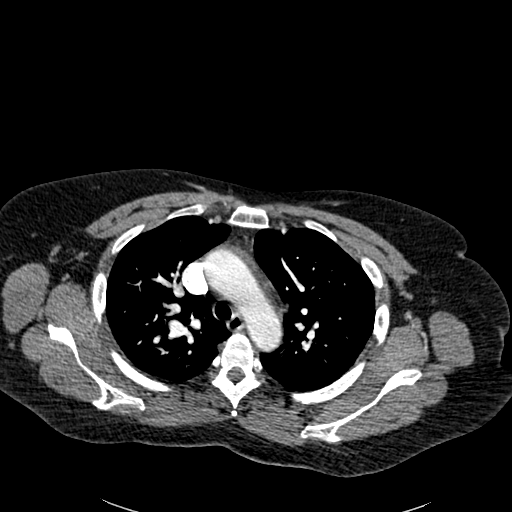
[im 356/445  lung]
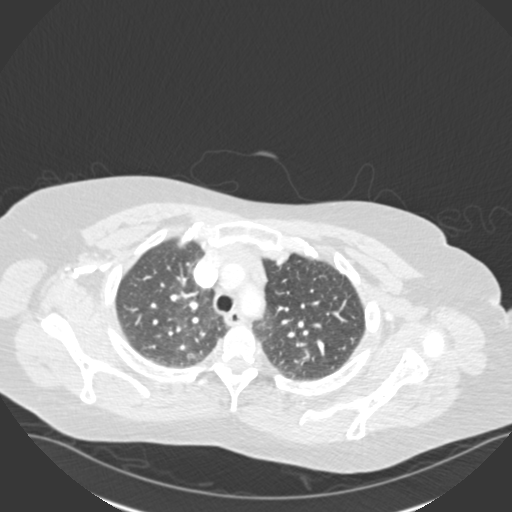
[im 378/445  mediastinal]
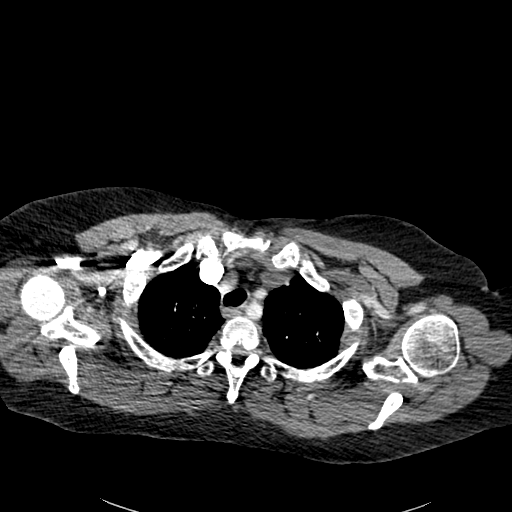
[im 400/445  lung]
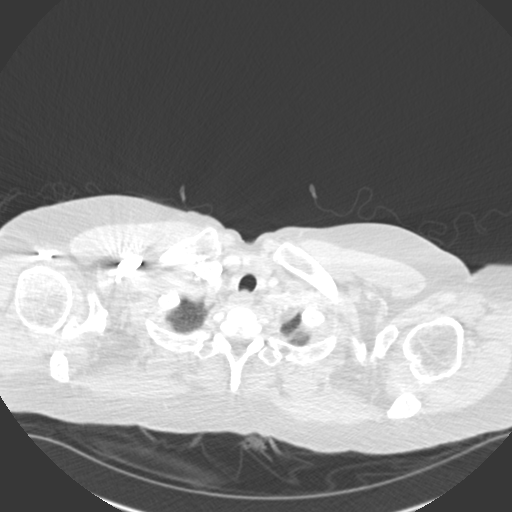
[im 422/445  mediastinal]
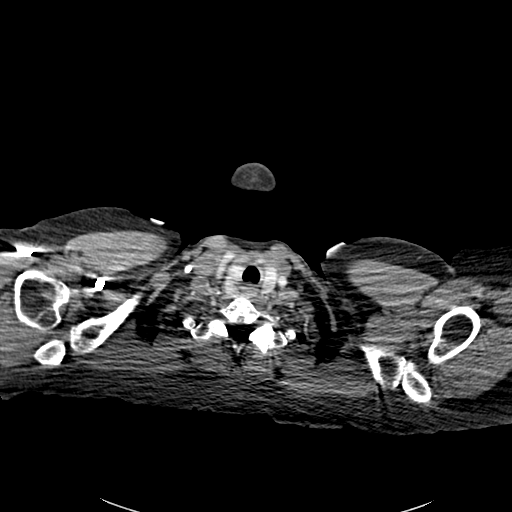

[19 of 36 positions shown; findings below may reference images not displayed]

FINDINGS: Cardiovascular: There are no filling defects within the pulmonary
arteries to suggest pulmonary embolus. Special attention paid to
left upper lobe pulmonary arteries, no filling defects are seen. No
thoracic aortic dissection or aneurysm. Common origin of the
brachiocephalic and left common carotid artery from the aortic arch,
a normal variant. Mild multi chamber cardiomegaly.

Mediastinum/Nodes: Small prevascular and lower paratracheal lymph
nodes are likely reactive. There is no hilar adenopathy. No axillary
adenopathy. No pericardial fluid.

Lungs/Pleura: Anterior left upper lobe consolidation with air
bronchograms consistent with pneumonia. Scattered atelectasis
elsewhere. No evidence of pulmonary edema. No pleural fluid.

Upper Abdomen: Multiple hepatic cysts.  No acute abnormality.

Musculoskeletal: There are no acute or suspicious osseous
abnormalities. Mild degenerative change in the spine

Review of the MIP images confirms the above findings.
IMPRESSION: 1. No pulmonary embolus.
2. Anterior left upper lobe consolidation with air bronchograms
consistent with pneumonia. As recommended on radiographs, follow-up
PA and lateral chest radiographs recommended in 3-4 weeks after
course of treatment to ensure clearing.

## 2018-04-27 DIAGNOSIS — H16223 Keratoconjunctivitis sicca, not specified as Sjogren's, bilateral: Secondary | ICD-10-CM

## 2018-04-27 DIAGNOSIS — H25013 Cortical age-related cataract, bilateral: Secondary | ICD-10-CM | POA: Insufficient documentation

## 2018-04-27 HISTORY — DX: Cortical age-related cataract, bilateral: H25.013

## 2018-04-27 HISTORY — DX: Keratoconjunctivitis sicca, not specified as Sjogren's, bilateral: H16.223

## 2022-01-02 ENCOUNTER — Emergency Department (HOSPITAL_BASED_OUTPATIENT_CLINIC_OR_DEPARTMENT_OTHER): Payer: Medicare Other

## 2022-01-02 ENCOUNTER — Emergency Department (HOSPITAL_BASED_OUTPATIENT_CLINIC_OR_DEPARTMENT_OTHER)
Admission: EM | Admit: 2022-01-02 | Discharge: 2022-01-02 | Disposition: A | Discharge status: Home / Self Care | Payer: Medicare Other | Source: Home / Self Care | Attending: Emergency Medicine | Admitting: Emergency Medicine

## 2022-01-02 ENCOUNTER — Inpatient Hospital Stay (HOSPITAL_BASED_OUTPATIENT_CLINIC_OR_DEPARTMENT_OTHER)
Admission: EM | Admit: 2022-01-02 | Discharge: 2022-01-04 | DRG: 641 | Disposition: A | Discharge status: Home / Self Care | Payer: Medicare Other | Attending: Internal Medicine | Admitting: Internal Medicine

## 2022-01-02 ENCOUNTER — Encounter (HOSPITAL_BASED_OUTPATIENT_CLINIC_OR_DEPARTMENT_OTHER): Payer: Self-pay

## 2022-01-02 ENCOUNTER — Other Ambulatory Visit: Payer: Self-pay

## 2022-01-02 DIAGNOSIS — Z888 Allergy status to other drugs, medicaments and biological substances status: Secondary | ICD-10-CM

## 2022-01-02 DIAGNOSIS — Z825 Family history of asthma and other chronic lower respiratory diseases: Secondary | ICD-10-CM

## 2022-01-02 DIAGNOSIS — R109 Unspecified abdominal pain: Secondary | ICD-10-CM | POA: Insufficient documentation

## 2022-01-02 DIAGNOSIS — R079 Chest pain, unspecified: Secondary | ICD-10-CM | POA: Diagnosis not present

## 2022-01-02 DIAGNOSIS — D649 Anemia, unspecified: Secondary | ICD-10-CM

## 2022-01-02 DIAGNOSIS — E871 Hypo-osmolality and hyponatremia: Secondary | ICD-10-CM | POA: Insufficient documentation

## 2022-01-02 DIAGNOSIS — Z20822 Contact with and (suspected) exposure to covid-19: Secondary | ICD-10-CM | POA: Diagnosis present

## 2022-01-02 DIAGNOSIS — Z886 Allergy status to analgesic agent status: Secondary | ICD-10-CM

## 2022-01-02 DIAGNOSIS — R110 Nausea: Secondary | ICD-10-CM | POA: Insufficient documentation

## 2022-01-02 DIAGNOSIS — Z823 Family history of stroke: Secondary | ICD-10-CM

## 2022-01-02 DIAGNOSIS — R112 Nausea with vomiting, unspecified: Secondary | ICD-10-CM

## 2022-01-02 DIAGNOSIS — Z79899 Other long term (current) drug therapy: Secondary | ICD-10-CM

## 2022-01-02 DIAGNOSIS — Z6841 Body Mass Index (BMI) 40.0 and over, adult: Secondary | ICD-10-CM

## 2022-01-02 DIAGNOSIS — I10 Essential (primary) hypertension: Secondary | ICD-10-CM

## 2022-01-02 DIAGNOSIS — Z7982 Long term (current) use of aspirin: Secondary | ICD-10-CM

## 2022-01-02 DIAGNOSIS — R0789 Other chest pain: Secondary | ICD-10-CM

## 2022-01-02 DIAGNOSIS — E7800 Pure hypercholesterolemia, unspecified: Secondary | ICD-10-CM | POA: Diagnosis present

## 2022-01-02 HISTORY — DX: Hypo-osmolality and hyponatremia: E87.1

## 2022-01-02 LAB — URINALYSIS, MICROSCOPIC (REFLEX)

## 2022-01-02 LAB — URINALYSIS, ROUTINE W REFLEX MICROSCOPIC
Bilirubin Urine: NEGATIVE
Bilirubin Urine: NEGATIVE
Glucose, UA: NEGATIVE mg/dL
Glucose, UA: NEGATIVE mg/dL
Ketones, ur: NEGATIVE mg/dL
Ketones, ur: NEGATIVE mg/dL
Leukocytes,Ua: NEGATIVE
Leukocytes,Ua: NEGATIVE
Nitrite: NEGATIVE
Nitrite: NEGATIVE
Protein, ur: NEGATIVE mg/dL
Protein, ur: 30 mg/dL — AB
Specific Gravity, Urine: 1.01 (ref 1.005–1.030)
Specific Gravity, Urine: 1.02 (ref 1.005–1.030)
pH: 6.5 (ref 5.0–8.0)
pH: 7 (ref 5.0–8.0)

## 2022-01-02 LAB — CBC
HCT: 32.4 % — ABNORMAL LOW (ref 36.0–46.0)
Hemoglobin: 11 g/dL — ABNORMAL LOW (ref 12.0–15.0)
MCH: 29.6 pg (ref 26.0–34.0)
MCHC: 34 g/dL (ref 30.0–36.0)
MCV: 87.3 fL (ref 80.0–100.0)
Platelets: 230 10*3/uL (ref 150–400)
RBC: 3.71 MIL/uL — ABNORMAL LOW (ref 3.87–5.11)
RDW: 12.7 % (ref 11.5–15.5)
WBC: 5 10*3/uL (ref 4.0–10.5)
nRBC: 0 % (ref 0.0–0.2)

## 2022-01-02 LAB — CBC WITH DIFFERENTIAL/PLATELET
Abs Immature Granulocytes: 0 10*3/uL (ref 0.00–0.07)
Basophils Absolute: 0 10*3/uL (ref 0.0–0.1)
Basophils Relative: 0 %
Eosinophils Absolute: 0.2 10*3/uL (ref 0.0–0.5)
Eosinophils Relative: 3 %
HCT: 32.2 % — ABNORMAL LOW (ref 36.0–46.0)
Hemoglobin: 11.2 g/dL — ABNORMAL LOW (ref 12.0–15.0)
Lymphocytes Relative: 5 %
Lymphs Abs: 0.3 10*3/uL — ABNORMAL LOW (ref 0.7–4.0)
MCH: 29.7 pg (ref 26.0–34.0)
MCHC: 34.8 g/dL (ref 30.0–36.0)
MCV: 85.4 fL (ref 80.0–100.0)
Monocytes Absolute: 0.5 10*3/uL (ref 0.1–1.0)
Monocytes Relative: 10 %
Neutro Abs: 4.1 10*3/uL (ref 1.7–7.7)
Neutrophils Relative %: 82 %
Platelets: 203 10*3/uL (ref 150–400)
RBC: 3.77 MIL/uL — ABNORMAL LOW (ref 3.87–5.11)
RDW: 12.5 % (ref 11.5–15.5)
Smear Review: NORMAL
WBC: 5 10*3/uL (ref 4.0–10.5)
nRBC: 0 % (ref 0.0–0.2)

## 2022-01-02 LAB — COMPREHENSIVE METABOLIC PANEL
ALT: 27 U/L (ref 0–44)
ALT: 26 U/L (ref 0–44)
AST: 35 U/L (ref 15–41)
AST: 39 U/L (ref 15–41)
Albumin: 3.3 g/dL — ABNORMAL LOW (ref 3.5–5.0)
Albumin: 3.5 g/dL (ref 3.5–5.0)
Alkaline Phosphatase: 43 U/L (ref 38–126)
Alkaline Phosphatase: 44 U/L (ref 38–126)
Anion gap: 10 (ref 5–15)
Anion gap: 10 (ref 5–15)
BUN: 15 mg/dL (ref 8–23)
BUN: 12 mg/dL (ref 8–23)
CO2: 24 mmol/L (ref 22–32)
CO2: 26 mmol/L (ref 22–32)
Calcium: 9.4 mg/dL (ref 8.9–10.3)
Calcium: 9.3 mg/dL (ref 8.9–10.3)
Chloride: 85 mmol/L — ABNORMAL LOW (ref 98–111)
Chloride: 83 mmol/L — ABNORMAL LOW (ref 98–111)
Creatinine, Ser: 0.68 mg/dL (ref 0.44–1.00)
Creatinine, Ser: 0.68 mg/dL (ref 0.44–1.00)
GFR, Estimated: >60 mL/min (ref >60)
GFR, Estimated: >60 mL/min (ref >60)
Glucose, Bld: 112 mg/dL — ABNORMAL HIGH (ref 70–99)
Glucose, Bld: 107 mg/dL — ABNORMAL HIGH (ref 70–99)
Potassium: 3.5 mmol/L (ref 3.5–5.1)
Potassium: 3.7 mmol/L (ref 3.5–5.1)
Sodium: 119 mmol/L — CL (ref 135–145)
Sodium: 119 mmol/L — CL (ref 135–145)
Total Bilirubin: 0.4 mg/dL (ref 0.3–1.2)
Total Bilirubin: 0.2 mg/dL — ABNORMAL LOW (ref 0.3–1.2)
Total Protein: 8.4 g/dL — ABNORMAL HIGH (ref 6.5–8.1)
Total Protein: 8.5 g/dL — ABNORMAL HIGH (ref 6.5–8.1)

## 2022-01-02 LAB — D-DIMER, QUANTITATIVE: D-Dimer, Quant: 0.77 ug/mL-FEU — ABNORMAL HIGH (ref 0.00–0.50)

## 2022-01-02 LAB — LIPASE, BLOOD
Lipase: 26 U/L (ref 11–51)
Lipase: 25 U/L (ref 11–51)

## 2022-01-02 LAB — RESP PANEL BY RT-PCR (FLU A&B, COVID) ARPGX2
Influenza A by PCR: NEGATIVE
Influenza B by PCR: NEGATIVE
SARS Coronavirus 2 by RT PCR: NEGATIVE

## 2022-01-02 LAB — TROPONIN I (HIGH SENSITIVITY)
Troponin I (High Sensitivity): 9 ng/L (ref <18)
Troponin I (High Sensitivity): 9 ng/L (ref <18)

## 2022-01-02 MED ORDER — PANTOPRAZOLE SODIUM 40 MG IV SOLR
40.0000 mg | Freq: Once | INTRAVENOUS | Status: AC
  Administered 2022-01-02: 40 mg via INTRAVENOUS
  Filled 2022-01-02: qty 10

## 2022-01-02 MED ORDER — ONDANSETRON HCL 4 MG/2ML IJ SOLN
4.0000 mg | Freq: Once | INTRAMUSCULAR | Status: AC
  Administered 2022-01-02: 4 mg via INTRAVENOUS
  Filled 2022-01-02: qty 2

## 2022-01-02 MED ORDER — IOHEXOL 350 MG/ML SOLN
100.0000 mL | Freq: Once | INTRAVENOUS | Status: AC | PRN
  Administered 2022-01-02: 100 mL via INTRAVENOUS

## 2022-01-02 MED ORDER — SODIUM CHLORIDE 0.9 % IV SOLN: INTRAVENOUS | Status: DC

## 2022-01-02 MED ORDER — SODIUM CHLORIDE 0.9 % IV BOLUS
500.0000 mL | Freq: Once | INTRAVENOUS | Status: AC
Start: 2022-01-02 — End: 2022-01-02
  Administered 2022-01-02: 500 mL via INTRAVENOUS

## 2022-01-02 MED ORDER — IOHEXOL 300 MG/ML  SOLN
100.0000 mL | Freq: Once | INTRAMUSCULAR | Status: AC | PRN
  Administered 2022-01-02: 100 mL via INTRAVENOUS

## 2022-01-02 MED ORDER — FUROSEMIDE 10 MG/ML IJ SOLN
40.0000 mg | Freq: Once | INTRAMUSCULAR | Status: AC
  Administered 2022-01-02: 40 mg via INTRAVENOUS
  Filled 2022-01-02: qty 4

## 2022-01-02 NOTE — ED Triage Notes (Signed)
Pt states she has been taking a colon cleanser, Zupoo, for three days and has not had a bowel movement. Pt c/o spasms in left side of abdomen and nausea.  Pt states she has not been able to sleep well in 3 days and feels short of breath.

## 2022-01-02 NOTE — Plan of Care (Addendum)
Pt with hyponatremia to 119, EDP thinks dehydrated and putting on NS.  Had just been on bowel prep at recommendation of a friend.  (Sounds like dehydration is reasonable with overuse of laxatives as cause).  Will order urine sodium and urine osm.  Tele obs, [facility].  [facility] will assume care on arrival to accepting facility. Until arrival, care as per EDP. However, [facility] available 24/7 for questions and assistance.  Nursing staff, please page [facility] Admits and Consults [phone]) as soon as the patient arrives to the hospital.

## 2022-01-02 NOTE — Discharge Instructions (Signed)
Please limit your liquid intake for today and tomorrow to 1 or 1-1/2 L and then you may return to your normal fluid intake.  Schedule follow-up with your doctor in 1 week for a recheck of your sodium.

## 2022-01-02 NOTE — ED Provider Notes (Addendum)
[facility] DEPART[facility] Provider Note   CSN: [id number] Arrival date & time: 01/02/22  1701     History  Chief Complaint  Patient presents with   Chest Pain    Jenny Martin is a 76 y.o. female.  Patient seen last evening.  Patient was in with concerns for abdominal pain at that time.  Noted to have fairly significant hyponatremia with a sodium of 119.  Patient had been working on a bowel prep cleanse for the past 3 days.  Just to help with constipation.  Patient was discharged with water restriction and close follow-up.  Patient is now back.  Complaint of burning in the chest has had some nausea and 2 episodes of vomiting.  Also with a complaint of shortness of breath.  No blood in the vomit.  CT scan of the abdomen pelvis was done when she was here without any acute findings.  Patient did not have any chest pain when seen earlier this morning.  Past medical history is significant for pneumonia high cholesterol.  History of pneumonia in the past.      Home Medications Prior to Admission medications   Medication Sig Start Date End Date Taking? Authorizing Provider  acetaminophen (TYLENOL) 500 MG tablet Take 500 mg by mouth every 6 (six) hours as needed. For pain    [provider]  aspirin 325 MG tablet Take 325 mg by mouth once. When felt chest tightness    [provider]  azithromycin (ZITHROMAX) 250 MG tablet Take 1 tablet (250 mg total) by mouth daily. Take first 2 tablets together, then 1 every day until finished. 10/21/16   Julianne Rice, MD  Calcium Citrate-Vitamin D (CITRACAL/VITAMIN D PO) Take 2 tablets by mouth daily.    [provider]  cephALEXin (KEFLEX) 250 MG capsule Take 1 capsule (250 mg total) by mouth 4 (four) times daily. 07/20/13   Dorie Rank, MD  doxycycline (VIBRAMYCIN) 100 MG capsule Take 1 capsule (100 mg total) by mouth 2 (two) times daily. 09/17/14   Veryl Speak, MD  fluticasone (FLONASE) 50 MCG/ACT nasal  spray Place 1 spray into both nostrils daily.    [provider]  fluticasone-salmeterol (ADVAIR HFA) 115-21 MCG/ACT inhaler Inhale 2 puffs into the lungs 2 (two) times daily.    [provider]  guaiFENesin-codeine 100-10 MG/5ML syrup Take 10 mLs by mouth every 6 (six) hours as needed for cough. 09/17/14   Veryl Speak, MD  hydrocortisone cream 0.5 % Apply topically 2 (two) times daily. 07/20/13   Dorie Rank, MD  KRILL OIL OMEGA-3 PO Take 1 tablet by mouth.    [provider]  PRESCRIPTION MEDICATION Inhale 2 puffs into the lungs. For SOB from bronchitis  (2 unknown inhalers- sample packs from MD)    [provider]      Allergies    Ibuprofen and Statins    Review of Systems   Review of Systems  Constitutional:  Negative for chills and fever.  HENT:  Negative for ear pain and sore throat.   Eyes:  Negative for pain and visual disturbance.  Respiratory:  Positive for shortness of breath. Negative for cough.   Cardiovascular:  Positive for chest pain. Negative for palpitations.  Gastrointestinal:  Positive for nausea and vomiting. Negative for abdominal pain.  Genitourinary:  Negative for dysuria and hematuria.  Musculoskeletal:  Negative for arthralgias and back pain.  Skin:  Negative for color change and rash.  Neurological:  Negative for seizures and  syncope.  All other systems reviewed and are negative.  Physical Exam Updated Vital Signs BP 136/83    Pulse 63    Temp 98.7 F (37.1 C) (Oral)    Resp 12    Ht 1.651 m (5\' 5" )    Wt 131.1 kg    SpO2 100%    BMI 48.09 kg/m  Physical Exam Vitals and nursing note reviewed.  Constitutional:      General: She is not in acute distress.    Appearance: Normal appearance. She is well-developed.  HENT:     Head: Normocephalic and atraumatic.     Mouth/Throat:     Mouth: Mucous membranes are dry.  Eyes:     Extraocular Movements: Extraocular movements intact.     Conjunctiva/sclera: Conjunctivae  normal.     Pupils: Pupils are equal, round, and reactive to light.  Cardiovascular:     Rate and Rhythm: Normal rate and regular rhythm.     Heart sounds: No murmur heard.    Comments: Questionable faint murmur. Pulmonary:     Effort: Pulmonary effort is normal. No respiratory distress.     Breath sounds: Rhonchi present. No wheezing.  Abdominal:     Palpations: Abdomen is soft.     Tenderness: There is no abdominal tenderness.  Musculoskeletal:        General: No swelling.     Cervical back: Neck supple.     Right lower leg: Edema present.     Left lower leg: Edema present.  Skin:    General: Skin is warm and dry.     Capillary Refill: Capillary refill takes less than 2 seconds.  Neurological:     General: No focal deficit present.     Mental Status: She is alert and oriented to person, place, and time.  Psychiatric:        Mood and Affect: Mood normal.    ED Results / Procedures / Treatments   Labs (all labs ordered are listed, but only abnormal results are displayed) Labs Reviewed  COMPREHENSIVE METABOLIC PANEL - Abnormal; Notable for the following components:      Result Value   Sodium 119 (*)    Chloride 83 (*)    Glucose, Bld 107 (*)    Total Protein 8.5 (*)    Total Bilirubin 0.2 (*)    All other components within normal limits  CBC WITH DIFFERENTIAL/PLATELET - Abnormal; Notable for the following components:   RBC 3.77 (*)    Hemoglobin 11.2 (*)    HCT 32.2 (*)    Lymphs Abs 0.3 (*)    All other components within normal limits  URINALYSIS, ROUTINE W REFLEX MICROSCOPIC - Abnormal; Notable for the following components:   Hgb urine dipstick LARGE (*)    Protein, ur 30 (*)    All other components within normal limits  URINALYSIS, MICROSCOPIC (REFLEX) - Abnormal; Notable for the following components:   Bacteria, UA FEW (*)    All other components within normal limits  D-DIMER, QUANTITATIVE - Abnormal; Notable for the following components:   D-Dimer, Quant 0.77  (*)    All other components within normal limits  RESP PANEL BY RT-PCR (FLU A&B, COVID) ARPGX2  LIPASE, BLOOD  TROPONIN I (HIGH SENSITIVITY)    EKG EKG Interpretation  Date/Time:  Saturday January 02 2022 17:19:28 EST Ventricular Rate:  70 PR Interval:  230 QRS Duration: 106 QT Interval:  418 QTC Calculation: 451 R Axis:   -12 Text Interpretation: Sinus rhythm with  1st degree A-V block Left ventricular hypertrophy with repolarization abnormality ( R in aVL , Cornell product , Romhilt-Estes ) Abnormal ECG When compared with ECG of 20-Oct-2016 20:40, PREVIOUS ECG IS PRESENT Confirmed by Fredia Sorrow [phone]) on 01/02/2022 6:13:42 PM  Radiology CT ABDOMEN PELVIS W CONTRAST  Result Date: 01/02/2022 CLINICAL DATA:  76 year old female with abdominal pain. "Taking a colon cleanser for 3 days with no bowel movement?. Left side abdominal pain and nausea. EXAM: CT ABDOMEN AND PELVIS WITH CONTRAST TECHNIQUE: Multidetector CT imaging of the abdomen and pelvis was performed using the standard protocol following bolus administration of intravenous contrast. RADIATION DOSE REDUCTION: This exam was performed according to the departmental dose-optimization program which includes automated exposure control, adjustment of the mA and/or kV according to patient size and/or use of iterative reconstruction technique. CONTRAST:  162mL OMNIPAQUE IOHEXOL 300 MG/ML  SOLN COMPARISON:  CTA chest 10/20/2016. FINDINGS: Lower chest: 2017 left upper lobe pneumonia appears resolved. Chronic elevation of the left hemidiaphragm not significantly changed. Mild chronic cardiomegaly. No pericardial or pleural effusion. Hepatobiliary: Scattered small circumscribed low-density benign hepatic cysts, including up to 2 cm at the liver dome, are stable since 2017. Negative gallbladder. No bile duct enlargement. Pancreas: Negative. Spleen: Negative. Adrenals/Urinary Tract: Normal adrenal glands. Nonobstructed kidneys with symmetric  renal enhancement. No nephrolithiasis or pararenal inflammation. Small right renal midpole low-density cyst (series 2, image 47) is new since 2017 but appears benign. No hydroureter. Unremarkable bladder. Stomach/Bowel: Small volume of retained stool in the rectosigmoid colon. Descending colon decompressed. Redundant transverse colon and right colon also with some retained stool. Cecum on a lax mesentery in the midline. Normal appendix on coronal image 92. No large bowel inflammation. Decompressed terminal ileum. No dilated small bowel. Small fat containing umbilical hernia. Stomach and duodenum within normal limits. No free air, free fluid, or mesenteric inflammation identified. Vascular/Lymphatic: Mildly tortuous aorta and iliac arteries. No calcified atherosclerosis or lymphadenopathy identified. Reproductive: Coarsely calcified 4.1 cm uterine body fibroid. Otherwise negative. Other: No pelvic free fluid. Musculoskeletal: Advanced disc degeneration at the lumbosacral junction and widespread lumbar facet arthropathy. No acute or suspicious osseous lesion. IMPRESSION: 1. No acute or inflammatory process identified in the abdomen or pelvis. 2. Fibroid uterus. Chronic benign hepatic cysts. Small fat containing umbilical hernia. 3. Chronic mild cardiomegaly. A 2017 left upper lobe pneumonia resolved. Electronically Signed   By: Genevie Ann M.D.   On: 01/02/2022 04:23   DG Chest Port 1 View  Result Date: 01/02/2022 CLINICAL DATA:  Patient reports burning in her chest and some shortness of breath. EXAM: PORTABLE CHEST 1 VIEW COMPARISON:  Chest radiograph 11/10/2016 FINDINGS: Stable cardiomegaly. Unchanged elevation of the left hemidiaphragm. Bibasilar subsegmental atelectasis. No pleural effusion or pneumothorax. Visualized osseous structures are unremarkable. IMPRESSION: No acute cardiopulmonary abnormality.  Stable cardiomegaly. Electronically Signed   By: Ileana Roup M.D.   On: 01/02/2022 20:32     Procedures Procedures    Medications Ordered in ED Medications  0.9 %  sodium chloride infusion ( Intravenous New Bag/Given 01/02/22 2045)  sodium chloride 0.9 % bolus 500 mL (500 mLs Intravenous New Bag/Given 01/02/22 2040)  pantoprazole (PROTONIX) injection 40 mg (40 mg Intravenous Given 01/02/22 2041)  ondansetron (ZOFRAN) injection 4 mg (4 mg Intravenous Given 01/02/22 2040)    ED Course/ Medical Decision Making/ A&P                           Medical Decision Making Amount  and/or Complexity of Data Reviewed Labs: ordered. Radiology: ordered.  Risk Prescription drug management. Decision regarding hospitalization.  CRITICAL CARE Performed by: Fredia Sorrow Total critical care time: 45 minutes Critical care time was exclusive of separately billable procedures and treating other patients. Critical care was necessary to treat or prevent imminent or life-threatening deterioration. Critical care was time spent personally by me on the following activities: development of treatment plan with patient and/or surrogate as well as nursing, discussions with consultants, evaluation of patient's response to treatment, examination of patient, obtaining history from patient or surrogate, ordering and performing treatments and interventions, ordering and review of laboratory studies, ordering and review of radiographic studies, pulse oximetry and re-evaluation of patient's condition.    Patient's work-up from the early morning showed significant hyponatremia.  CT scan of the abdomen without any acute findings.  Patient now back with new complaint of shortness of breath chest pain and 2 episodes of vomiting several episodes of dry heaves.  Patient's urinalysis here today's large hemoglobin RBCs 11-20 bacteria few nitrite negative white blood cells 0-5.  Sodium still 119.  CO2 is 26 renal function normal with a GFR greater than 60.  No leukocytosis.  White blood cell count 5.0 hemoglobin 11.2.  A  little bit on the low side.  Platelets normal at 203.  EKG without any acute findings.  We will get chest x-ray we will check for D-dimer troponins.  COVID influenza testing for admission.  We will start normal saline IV for the hyponatremia.  We will give Protonix.  And antinausea medicine Zofran.  Patient's initial troponin not elevated at 9.  Lipase here 25.  D-dimer slightly elevated based on age at 0.23 she is 76 years old.  We will go ahead and do CT angio because of these new symptoms.  Chest x-ray no acute cardiopulmonary abnormality.  Ultimately patient will get admitted for the hyponatremia.  CT angio without any acute findings.  Delta troponin is still pending.  Seen receiving IV normal saline.  Will contact hospitalist for admission for the hyponatremia.  Patient's nausea is improved.  No further vomiting since getting here.   Final Clinical Impression(s) / ED Diagnoses Final diagnoses:  Hyponatremia  Atypical chest pain  Nausea and vomiting, unspecified vomiting type    Rx / DC Orders ED Discharge Orders     None         Fredia Sorrow, MD 01/02/22 2127    Fredia Sorrow, MD 01/02/22 [phone]

## 2022-01-02 NOTE — ED Notes (Signed)
Pt assisted to bathroom via wheelchair.

## 2022-01-02 NOTE — ED Provider Notes (Signed)
[facility] DEPARTMENT Provider Note   CSN: [id number] Arrival date & time: 01/02/22  0220     History  Chief Complaint  Patient presents with   Abdominal Pain    Jenny Martin is a 76 y.o. female.  Patient presents to the emergency department for evaluation of abdominal pain.  Patient reports that she has been doing a colon cleanse for 3 days.  She does have a history of some constipation and the cleanse was recommended by her sister.  Patient reports that she has not had a bowel movement with a cleanse.  She is experiencing nausea with spasms of the left side of her abdomen and back.  No vomiting.  No fever.  She reports that she had a colonoscopy a year ago and was told it was clear.      Home Medications Prior to Admission medications   Medication Sig Start Date End Date Taking? Authorizing Provider  acetaminophen (TYLENOL) 500 MG tablet Take 500 mg by mouth every 6 (six) hours as needed. For pain    [provider]  aspirin 325 MG tablet Take 325 mg by mouth once. When felt chest tightness    [provider]  azithromycin (ZITHROMAX) 250 MG tablet Take 1 tablet (250 mg total) by mouth daily. Take first 2 tablets together, then 1 every day until finished. 10/21/16   Julianne Rice, MD  Calcium Citrate-Vitamin D (CITRACAL/VITAMIN D PO) Take 2 tablets by mouth daily.    [provider]  cephALEXin (KEFLEX) 250 MG capsule Take 1 capsule (250 mg total) by mouth 4 (four) times daily. 07/20/13   Dorie Rank, MD  doxycycline (VIBRAMYCIN) 100 MG capsule Take 1 capsule (100 mg total) by mouth 2 (two) times daily. 09/17/14   Veryl Speak, MD  fluticasone (FLONASE) 50 MCG/ACT nasal spray Place 1 spray into both nostrils daily.    [provider]  fluticasone-salmeterol (ADVAIR HFA) 115-21 MCG/ACT inhaler Inhale 2 puffs into the lungs 2 (two) times daily.    [provider]  guaiFENesin-codeine 100-10 MG/5ML syrup Take 10 mLs by  mouth every 6 (six) hours as needed for cough. 09/17/14   Veryl Speak, MD  hydrocortisone cream 0.5 % Apply topically 2 (two) times daily. 07/20/13   Dorie Rank, MD  KRILL OIL OMEGA-3 PO Take 1 tablet by mouth.    [provider]  PRESCRIPTION MEDICATION Inhale 2 puffs into the lungs. For SOB from bronchitis  (2 unknown inhalers- sample packs from MD)    [provider]      Allergies    Ibuprofen and Statins    Review of Systems   Review of Systems  Gastrointestinal:  Positive for abdominal pain, constipation and nausea.   Physical Exam Updated Vital Signs BP (!) 150/68    Pulse 71    Temp 98.8 F (37.1 C) (Oral)    Resp 16    Ht 5\' 5"  (1.651 m)    Wt 131.1 kg    SpO2 97%    BMI 48.09 kg/m  Physical Exam Vitals and nursing note reviewed.  Constitutional:      General: She is not in acute distress.    Appearance: She is well-developed. She is obese.  HENT:     Head: Normocephalic and atraumatic.     Mouth/Throat:     Mouth: Mucous membranes are moist.  Eyes:     General: Vision grossly intact. Gaze aligned appropriately.     Extraocular Movements: Extraocular movements intact.  Conjunctiva/sclera: Conjunctivae normal.  Cardiovascular:     Rate and Rhythm: Normal rate and regular rhythm.     Pulses: Normal pulses.     Heart sounds: Normal heart sounds, S1 normal and S2 normal. No murmur heard.   No friction rub. No gallop.  Pulmonary:     Effort: Pulmonary effort is normal. No respiratory distress.     Breath sounds: Normal breath sounds.  Abdominal:     General: Bowel sounds are normal.     Palpations: Abdomen is soft.     Tenderness: There is no abdominal tenderness. There is no guarding or rebound.     Hernia: No hernia is present.  Musculoskeletal:        General: No swelling.     Cervical back: Full passive range of motion without pain, normal range of motion and neck supple. No spinous process tenderness or muscular tenderness. Normal range of  motion.     Right lower leg: No edema.     Left lower leg: No edema.  Skin:    General: Skin is warm and dry.     Capillary Refill: Capillary refill takes less than 2 seconds.     Findings: No ecchymosis, erythema, rash or wound.  Neurological:     General: No focal deficit present.     Mental Status: She is alert and oriented to person, place, and time.     GCS: GCS eye subscore is 4. GCS verbal subscore is 5. GCS motor subscore is 6.     Cranial Nerves: Cranial nerves 2-12 are intact.     Sensory: Sensation is intact.     Motor: Motor function is intact.     Coordination: Coordination is intact.  Psychiatric:        Attention and Perception: Attention normal.        Mood and Affect: Mood normal.        Speech: Speech normal.        Behavior: Behavior normal.    ED Results / Procedures / Treatments   Labs (all labs ordered are listed, but only abnormal results are displayed) Labs Reviewed  COMPREHENSIVE METABOLIC PANEL - Abnormal; Notable for the following components:      Result Value   Sodium 119 (*)    Chloride 85 (*)    Glucose, Bld 112 (*)    Total Protein 8.4 (*)    Albumin 3.3 (*)    All other components within normal limits  CBC - Abnormal; Notable for the following components:   RBC 3.71 (*)    Hemoglobin 11.0 (*)    HCT 32.4 (*)    All other components within normal limits  LIPASE, BLOOD  URINALYSIS, ROUTINE W REFLEX MICROSCOPIC    EKG None  Radiology CT ABDOMEN PELVIS W CONTRAST  Result Date: 01/02/2022 CLINICAL DATA:  76 year old female with abdominal pain. "Taking a colon cleanser for 3 days with no bowel movement?. Left side abdominal pain and nausea. EXAM: CT ABDOMEN AND PELVIS WITH CONTRAST TECHNIQUE: Multidetector CT imaging of the abdomen and pelvis was performed using the standard protocol following bolus administration of intravenous contrast. RADIATION DOSE REDUCTION: This exam was performed according to the departmental dose-optimization program  which includes automated exposure control, adjustment of the mA and/or kV according to patient size and/or use of iterative reconstruction technique. CONTRAST:  128mL OMNIPAQUE IOHEXOL 300 MG/ML  SOLN COMPARISON:  CTA chest 10/20/2016. FINDINGS: Lower chest: 2017 left upper lobe pneumonia appears resolved. Chronic elevation of the  left hemidiaphragm not significantly changed. Mild chronic cardiomegaly. No pericardial or pleural effusion. Hepatobiliary: Scattered small circumscribed low-density benign hepatic cysts, including up to 2 cm at the liver dome, are stable since 2017. Negative gallbladder. No bile duct enlargement. Pancreas: Negative. Spleen: Negative. Adrenals/Urinary Tract: Normal adrenal glands. Nonobstructed kidneys with symmetric renal enhancement. No nephrolithiasis or pararenal inflammation. Small right renal midpole low-density cyst (series 2, image 47) is new since 2017 but appears benign. No hydroureter. Unremarkable bladder. Stomach/Bowel: Small volume of retained stool in the rectosigmoid colon. Descending colon decompressed. Redundant transverse colon and right colon also with some retained stool. Cecum on a lax mesentery in the midline. Normal appendix on coronal image 92. No large bowel inflammation. Decompressed terminal ileum. No dilated small bowel. Small fat containing umbilical hernia. Stomach and duodenum within normal limits. No free air, free fluid, or mesenteric inflammation identified. Vascular/Lymphatic: Mildly tortuous aorta and iliac arteries. No calcified atherosclerosis or lymphadenopathy identified. Reproductive: Coarsely calcified 4.1 cm uterine body fibroid. Otherwise negative. Other: No pelvic free fluid. Musculoskeletal: Advanced disc degeneration at the lumbosacral junction and widespread lumbar facet arthropathy. No acute or suspicious osseous lesion. IMPRESSION: 1. No acute or inflammatory process identified in the abdomen or pelvis. 2. Fibroid uterus. Chronic benign  hepatic cysts. Small fat containing umbilical hernia. 3. Chronic mild cardiomegaly. A 2017 left upper lobe pneumonia resolved. Electronically Signed   By: Genevie Ann M.D.   On: 01/02/2022 04:23    Procedures Procedures    Medications Ordered in ED Medications  furosemide (LASIX) injection 40 mg (has no administration in time range)  iohexol (OMNIPAQUE) 300 MG/ML solution 100 mL (100 mLs Intravenous Contrast Given 01/02/22 0351)    ED Course/ Medical Decision Making/ A&P                           Medical Decision Making Amount and/or Complexity of Data Reviewed Labs: ordered. Radiology: ordered.  Risk Prescription drug management.   Patient presents to the emergency department for evaluation of abdominal discomfort.  Patient is 3 days into a colon cleanse.  This includes taking a significant amount of free water.  Abdominal exam is fairly benign.  CT scan performed to evaluate for diverticulitis, colitis, intestinal obstruction, constipation.  CT scan does not show any acute pathology.  Lab work reveals significant hyponatremia.  This is secondary to excessive free water intake with her colon cleanse.  Patient is asymptomatic.  She was diuresed with IV Lasix.  Patient will be on fluid restrictions for the next 2 days and is to terminate the cleanse.  Follow-up with PCP in 1 week for repeat blood work.  Return for any dizziness, confusion, seizure.        Final Clinical Impression(s) / ED Diagnoses Final diagnoses:  Hyponatremia    Rx / DC Orders ED Discharge Orders     None         Orpah Greek, MD 01/02/22 [phone]

## 2022-01-02 NOTE — ED Triage Notes (Signed)
Pt arrives with reports of burning in her chest and some SOB. States that she was seen here this morning r/t her colon cleanse. Pt also reports that she has been vomiting.

## 2022-01-03 ENCOUNTER — Encounter (HOSPITAL_COMMUNITY): Payer: Self-pay | Admitting: Internal Medicine

## 2022-01-03 DIAGNOSIS — E7800 Pure hypercholesterolemia, unspecified: Secondary | ICD-10-CM | POA: Diagnosis present

## 2022-01-03 DIAGNOSIS — D649 Anemia, unspecified: Secondary | ICD-10-CM

## 2022-01-03 DIAGNOSIS — Z888 Allergy status to other drugs, medicaments and biological substances status: Secondary | ICD-10-CM | POA: Diagnosis not present

## 2022-01-03 DIAGNOSIS — Z79899 Other long term (current) drug therapy: Secondary | ICD-10-CM | POA: Diagnosis not present

## 2022-01-03 DIAGNOSIS — Z825 Family history of asthma and other chronic lower respiratory diseases: Secondary | ICD-10-CM | POA: Diagnosis not present

## 2022-01-03 DIAGNOSIS — Z20822 Contact with and (suspected) exposure to covid-19: Secondary | ICD-10-CM | POA: Diagnosis present

## 2022-01-03 DIAGNOSIS — I10 Essential (primary) hypertension: Secondary | ICD-10-CM

## 2022-01-03 DIAGNOSIS — Z886 Allergy status to analgesic agent status: Secondary | ICD-10-CM | POA: Diagnosis not present

## 2022-01-03 DIAGNOSIS — R079 Chest pain, unspecified: Secondary | ICD-10-CM | POA: Diagnosis present

## 2022-01-03 DIAGNOSIS — Z823 Family history of stroke: Secondary | ICD-10-CM | POA: Diagnosis not present

## 2022-01-03 DIAGNOSIS — E871 Hypo-osmolality and hyponatremia: Secondary | ICD-10-CM | POA: Diagnosis present

## 2022-01-03 DIAGNOSIS — Z6841 Body Mass Index (BMI) 40.0 and over, adult: Secondary | ICD-10-CM | POA: Diagnosis not present

## 2022-01-03 DIAGNOSIS — Z7982 Long term (current) use of aspirin: Secondary | ICD-10-CM | POA: Diagnosis not present

## 2022-01-03 HISTORY — DX: Anemia, unspecified: D64.9

## 2022-01-03 HISTORY — DX: Essential (primary) hypertension: I10

## 2022-01-03 HISTORY — DX: Hypomagnesemia: E83.42

## 2022-01-03 LAB — CBC WITH DIFFERENTIAL/PLATELET
Abs Immature Granulocytes: 0.02 10*3/uL (ref 0.00–0.07)
Basophils Absolute: 0 10*3/uL (ref 0.0–0.1)
Basophils Relative: 0 %
Eosinophils Absolute: 0.1 10*3/uL (ref 0.0–0.5)
Eosinophils Relative: 3 %
HCT: 33 % — ABNORMAL LOW (ref 36.0–46.0)
Hemoglobin: 11.4 g/dL — ABNORMAL LOW (ref 12.0–15.0)
Immature Granulocytes: 0 %
Lymphocytes Relative: 16 %
Lymphs Abs: 0.7 10*3/uL (ref 0.7–4.0)
MCH: 29.6 pg (ref 26.0–34.0)
MCHC: 34.5 g/dL (ref 30.0–36.0)
MCV: 85.7 fL (ref 80.0–100.0)
Monocytes Absolute: 0.8 10*3/uL (ref 0.1–1.0)
Monocytes Relative: 17 %
Neutro Abs: 2.8 10*3/uL (ref 1.7–7.7)
Neutrophils Relative %: 64 %
Platelets: 252 10*3/uL (ref 150–400)
RBC: 3.85 MIL/uL — ABNORMAL LOW (ref 3.87–5.11)
RDW: 12.5 % (ref 11.5–15.5)
WBC: 4.5 10*3/uL (ref 4.0–10.5)
nRBC: 0 % (ref 0.0–0.2)

## 2022-01-03 LAB — RENAL FUNCTION PANEL
Albumin: 3 g/dL — ABNORMAL LOW (ref 3.5–5.0)
Anion gap: 8 (ref 5–15)
BUN: 14 mg/dL (ref 8–23)
CO2: 28 mmol/L (ref 22–32)
Calcium: 9.1 mg/dL (ref 8.9–10.3)
Chloride: 93 mmol/L — ABNORMAL LOW (ref 98–111)
Creatinine, Ser: 0.88 mg/dL (ref 0.44–1.00)
GFR, Estimated: >60 mL/min (ref >60)
Glucose, Bld: 98 mg/dL (ref 70–99)
Phosphorus: 3 mg/dL (ref 2.5–4.6)
Potassium: 3.5 mmol/L (ref 3.5–5.1)
Sodium: 129 mmol/L — ABNORMAL LOW (ref 135–145)

## 2022-01-03 LAB — COMPREHENSIVE METABOLIC PANEL
ALT: 28 U/L (ref 0–44)
AST: 38 U/L (ref 15–41)
Albumin: 3.4 g/dL — ABNORMAL LOW (ref 3.5–5.0)
Alkaline Phosphatase: 42 U/L (ref 38–126)
Anion gap: 9 (ref 5–15)
BUN: 12 mg/dL (ref 8–23)
CO2: 27 mmol/L (ref 22–32)
Calcium: 9.3 mg/dL (ref 8.9–10.3)
Chloride: 89 mmol/L — ABNORMAL LOW (ref 98–111)
Creatinine, Ser: 0.71 mg/dL (ref 0.44–1.00)
GFR, Estimated: >60 mL/min (ref >60)
Glucose, Bld: 97 mg/dL (ref 70–99)
Potassium: 3.8 mmol/L (ref 3.5–5.1)
Sodium: 125 mmol/L — ABNORMAL LOW (ref 135–145)
Total Bilirubin: 0.5 mg/dL (ref 0.3–1.2)
Total Protein: 8.5 g/dL — ABNORMAL HIGH (ref 6.5–8.1)

## 2022-01-03 LAB — SODIUM, URINE, RANDOM
Sodium, Ur: 135 mmol/L
Sodium, Ur: 28 mmol/L

## 2022-01-03 LAB — OSMOLALITY, URINE: Osmolality, Ur: 559 mOsm/kg (ref 300–900)

## 2022-01-03 LAB — MAGNESIUM: Magnesium: 1.6 mg/dL — ABNORMAL LOW (ref 1.7–2.4)

## 2022-01-03 MED ORDER — SODIUM CHLORIDE 0.9 % IV SOLN
INTRAVENOUS | Status: DC
Start: 2022-01-03 — End: 2022-01-04

## 2022-01-03 MED ORDER — FLUTICASONE PROPIONATE 50 MCG/ACT NA SUSP: 1.0000 | Freq: Every day | NASAL | Status: DC | PRN

## 2022-01-03 MED ORDER — EZETIMIBE 10 MG PO TABS
10.0000 mg | ORAL_TABLET | Freq: Every day | ORAL | Status: DC
  Administered 2022-01-03 – 2022-01-04 (×2): 10 mg via ORAL
  Filled 2022-01-03 (×2): qty 1

## 2022-01-03 MED ORDER — ADULT MULTIVITAMIN W/MINERALS CH
1.0000 | ORAL_TABLET | Freq: Every day | ORAL | Status: DC
  Administered 2022-01-03 – 2022-01-04 (×2): 1 via ORAL
  Filled 2022-01-03 (×2): qty 1

## 2022-01-03 MED ORDER — MAGNESIUM OXIDE -MG SUPPLEMENT 400 (240 MG) MG PO TABS
400.0000 mg | ORAL_TABLET | ORAL | Status: DC
  Administered 2022-01-03: 400 mg via ORAL
  Filled 2022-01-03 (×2): qty 1

## 2022-01-03 MED ORDER — ACETAMINOPHEN 500 MG PO TABS
500.0000 mg | ORAL_TABLET | Freq: Four times a day (QID) | ORAL | Status: DC | PRN
  Administered 2022-01-04: 500 mg via ORAL
  Filled 2022-01-03: qty 1

## 2022-01-03 MED ORDER — HYDRALAZINE HCL 20 MG/ML IJ SOLN: 10.0000 mg | Freq: Three times a day (TID) | INTRAMUSCULAR | Status: DC | PRN

## 2022-01-03 MED ORDER — ALBUTEROL SULFATE (2.5 MG/3ML) 0.083% IN NEBU: 3.0000 mL | INHALATION_SOLUTION | Freq: Four times a day (QID) | RESPIRATORY_TRACT | Status: DC | PRN

## 2022-01-03 MED ORDER — ASPIRIN 325 MG PO TABS: 325.0000 mg | ORAL_TABLET | Freq: Every day | ORAL | Status: DC | PRN

## 2022-01-03 MED ORDER — ENOXAPARIN SODIUM 60 MG/0.6ML IJ SOSY
60.0000 mg | PREFILLED_SYRINGE | INTRAMUSCULAR | Status: DC
  Administered 2022-01-03: 60 mg via SUBCUTANEOUS
  Filled 2022-01-03: qty 0.6

## 2022-01-03 NOTE — ED Notes (Signed)
Sitting in recliner, brother at bedside. Denies pain

## 2022-01-03 NOTE — ED Notes (Signed)
Pt requested to be unhooked to go to bathroom. Pt ambulatory with steady gait

## 2022-01-03 NOTE — H&P (Signed)
History and Physical    Patient: Jenny Martin IFO:[id number] DOB: 1946/07/07 DOA: 01/02/2022 DOS: the patient was seen and examined on 01/03/2022 PCP: [facility] MD  Patient coming from: Home  Chief Complaint:  Chief Complaint  Patient presents with   Chest Pain    HPI: Jenny Martin is a 76 y.o. female with medical history significant of HTN, HLD, edema. Presenting w chest pain and N/V. She reports that she has been taking a bowel prep for colon cleansing at the suggestion of her sister. She has been drinking copious amount of water. She has had a couple episodes of N/V w/ chest burning after. She became concerned and came back to the ED for help. She denies any other aggravating or alleviating factors.   Review of Systems: As mentioned in the history of present illness. All other systems reviewed and are negative. Past Medical History:  Diagnosis Date   Abnormal Pap smear 08/29/2008   Anemia    Arachnoid cyst    Atopic dermatitis    Blood transfusion without reported diagnosis    Chronic vulvitis    H/O bladder infections    High cholesterol    History of chicken pox    Hypercholesterolemia    Obese    Pneumonia, pneumococcal (HCC)    S/P thoracotomy    Past Surgical History:  Procedure Laterality Date   COLPOSCOPY  12/2008   normal   WRIST SURGERY     Social History:  reports that she has never smoked. She has never used smokeless tobacco. She reports that she does not drink alcohol and does not use drugs.  Allergies  Allergen Reactions   Ibuprofen Hives   Statins Other (See Comments)    cramping    Family History  Problem Relation Age of Onset   COPD Mother    Stroke Father     Prior to Admission medications   Medication Sig Start Date End Date Taking? Authorizing Provider  acetaminophen (TYLENOL) 500 MG tablet Take 500 mg by mouth every 6 (six) hours as needed. For pain    [provider]  aspirin 325 MG tablet Take 325 mg by mouth once.  When felt chest tightness    [provider]  azithromycin (ZITHROMAX) 250 MG tablet Take 1 tablet (250 mg total) by mouth daily. Take first 2 tablets together, then 1 every day until finished. 10/21/16   Loren Racer, MD  Calcium Citrate-Vitamin D (CITRACAL/VITAMIN D PO) Take 2 tablets by mouth daily.    [provider]  cephALEXin (KEFLEX) 250 MG capsule Take 1 capsule (250 mg total) by mouth 4 (four) times daily. 07/20/13   Linwood Dibbles, MD  doxycycline (VIBRAMYCIN) 100 MG capsule Take 1 capsule (100 mg total) by mouth 2 (two) times daily. 09/17/14   Geoffery Lyons, MD  fluticasone (FLONASE) 50 MCG/ACT nasal spray Place 1 spray into both nostrils daily.    [provider]  fluticasone-salmeterol (ADVAIR HFA) 115-21 MCG/ACT inhaler Inhale 2 puffs into the lungs 2 (two) times daily.    [provider]  guaiFENesin-codeine 100-10 MG/5ML syrup Take 10 mLs by mouth every 6 (six) hours as needed for cough. 09/17/14   Geoffery Lyons, MD  hydrocortisone cream 0.5 % Apply topically 2 (two) times daily. 07/20/13   Linwood Dibbles, MD  KRILL OIL OMEGA-3 PO Take 1 tablet by mouth.    [provider]  PRESCRIPTION MEDICATION Inhale 2 puffs into the lungs. For SOB from bronchitis  (2 unknown inhalers-  sample packs from MD)    [provider]    Physical Exam: Vitals:   01/03/22 0806 01/03/22 0900 01/03/22 1200 01/03/22 1339  BP: (!) 145/64 (!) 131/53 (!) 143/75 (!) 149/70  Pulse:  66 64 70  Resp: 17 16 16 16   Temp:    98.3 F (36.8 C)  TempSrc:    Oral  SpO2: 100% 99% 99% 100%  Weight:      Height:       General: 76 y.o. female resting in bed in NAD Eyes: PERRL, normal sclera ENMT: Nares patent w/o discharge, orophaynx clear, dentition normal, ears w/o discharge/lesions/ulcers Neck: Supple, trachea midline Cardiovascular: RRR, +S1, S2, no m/g/r, equal pulses throughout Respiratory: CTABL, no w/r/r, normal WOB GI: BS+, NDNT, no masses noted, no  organomegaly noted MSK: No c/c; BLE edema Neuro: A&O x 3, no focal deficits Psyc: Appropriate interaction and affect, calm/cooperative   Data Reviewed:  Na+ 119 -> 125 Mg2+ 1.6 Hgb 11.4  CT ab/pelvis w/ con: 1. No acute or inflammatory process identified in the abdomen or pelvis. 2. Fibroid uterus. Chronic benign hepatic cysts. Small fat containing umbilical hernia. 3. Chronic mild cardiomegaly. A 2017 left upper lobe pneumonia resolved.  CTA chest: No evidence of pulmonary emboli. No focal abnormality within the lungs. 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  Assessment and Plan: No notes have been filed under this hospital service. Service: Hospitalist Hyponatremia     - admit to obs, tele     - continue fluids     - UNa+, Uosm ordered     - regular diet     - check renal function panel q6h     - etiology likely excess water intake while taking bowel prep over the last couple of days  HTN     - hold home regimen as it has HCTZ; will have PRN BP meds ordered  Morbid obesity     - counsel on diet, lifestyle changes  HLD     - continue home regimen  Normocytic anemia     - no evidence of bleed, follow  Chest pain     - resolved     - trp negative     - EKG sinus, no st elevations; ?1st av block  Chronic hypomagnesemia     - continue home regimen  Advance Care Planning:   Code Status: FULL  Consults: None  Family Communication: w/ son at bedside  Severity of Illness: The appropriate patient status for this patient is INPATIENT. Inpatient status is judged to be reasonable and necessary in order to provide the required intensity of service to ensure the patient's safety. The patient's presenting symptoms, physical exam findings, and initial radiographic and laboratory data in the context of their chronic comorbidities is felt to place them at high risk for further clinical deterioration. Furthermore, it is not anticipated that the patient will be medically  stable for discharge from the hospital within 2 midnights of admission.   * I certify that at the point of admission it is my clinical judgment that the patient will require inpatient hospital care spanning beyond 2 midnights from the point of admission due to high intensity of service, high risk for further deterioration and high frequency of surveillance required.*  Author: 2018, DO 01/03/2022 2:14 PM  For on call review [email or web address].

## 2022-01-03 NOTE — ED Notes (Signed)
Report given to Arther Dames, receiving nurse at [facility]. Bedside report given to [facility] as well

## 2022-01-04 LAB — CBC
HCT: 28.6 % — ABNORMAL LOW (ref 36.0–46.0)
Hemoglobin: 9.6 g/dL — ABNORMAL LOW (ref 12.0–15.0)
MCH: 29.6 pg (ref 26.0–34.0)
MCHC: 33.6 g/dL (ref 30.0–36.0)
MCV: 88.3 fL (ref 80.0–100.0)
Platelets: 206 10*3/uL (ref 150–400)
RBC: 3.24 MIL/uL — ABNORMAL LOW (ref 3.87–5.11)
RDW: 12.7 % (ref 11.5–15.5)
WBC: 4.8 10*3/uL (ref 4.0–10.5)
nRBC: 0 % (ref 0.0–0.2)

## 2022-01-04 LAB — RENAL FUNCTION PANEL
Albumin: 2.8 g/dL — ABNORMAL LOW (ref 3.5–5.0)
Albumin: 3.3 g/dL — ABNORMAL LOW (ref 3.5–5.0)
Anion gap: 4 — ABNORMAL LOW (ref 5–15)
Anion gap: 8 (ref 5–15)
BUN: 16 mg/dL (ref 8–23)
BUN: 16 mg/dL (ref 8–23)
CO2: 29 mmol/L (ref 22–32)
CO2: 26 mmol/L (ref 22–32)
Calcium: 8.8 mg/dL — ABNORMAL LOW (ref 8.9–10.3)
Calcium: 9 mg/dL (ref 8.9–10.3)
Chloride: 95 mmol/L — ABNORMAL LOW (ref 98–111)
Chloride: 94 mmol/L — ABNORMAL LOW (ref 98–111)
Creatinine, Ser: 0.85 mg/dL (ref 0.44–1.00)
Creatinine, Ser: 0.81 mg/dL (ref 0.44–1.00)
GFR, Estimated: >60 mL/min (ref >60)
GFR, Estimated: >60 mL/min (ref >60)
Glucose, Bld: 92 mg/dL (ref 70–99)
Glucose, Bld: 99 mg/dL (ref 70–99)
Phosphorus: 3.6 mg/dL (ref 2.5–4.6)
Phosphorus: 3.8 mg/dL (ref 2.5–4.6)
Potassium: 3.8 mmol/L (ref 3.5–5.1)
Potassium: 3.9 mmol/L (ref 3.5–5.1)
Sodium: 128 mmol/L — ABNORMAL LOW (ref 135–145)
Sodium: 128 mmol/L — ABNORMAL LOW (ref 135–145)

## 2022-01-04 LAB — BASIC METABOLIC PANEL
Anion gap: 6 (ref 5–15)
BUN: 16 mg/dL (ref 8–23)
CO2: 28 mmol/L (ref 22–32)
Calcium: 9 mg/dL (ref 8.9–10.3)
Chloride: 96 mmol/L — ABNORMAL LOW (ref 98–111)
Creatinine, Ser: 0.79 mg/dL (ref 0.44–1.00)
GFR, Estimated: >60 mL/min (ref >60)
Glucose, Bld: 95 mg/dL (ref 70–99)
Potassium: 3.8 mmol/L (ref 3.5–5.1)
Sodium: 130 mmol/L — ABNORMAL LOW (ref 135–145)

## 2022-01-04 LAB — OSMOLALITY, URINE: Osmolality, Ur: 227 mOsm/kg — ABNORMAL LOW (ref 300–900)

## 2022-01-04 NOTE — Plan of Care (Signed)
°  Problem: Clinical Measurements: Goal: Respiratory complications will improve Outcome: Adequate for Discharge   Problem: Clinical Measurements: Goal: Cardiovascular complication will be avoided Outcome: Adequate for Discharge   Problem: Activity: Goal: Risk for activity intolerance will decrease Outcome: Adequate for Discharge   Problem: Nutrition: Goal: Adequate nutrition will be maintained Outcome: Adequate for Discharge   Problem: Elimination: Goal: Will not experience complications related to bowel motility Outcome: Adequate for Discharge

## 2022-01-04 NOTE — Hospital Course (Signed)
Jenny Martin is a 76 y.o. female with medical history significant of HTN, HLD, edema.  She presented with nausea and vomiting.  She had recently started taking an oral bowel prep at home followed by excessive amounts of water due to lack of effect.  When she started developing nausea and vomiting, she presented to the ER for further evaluation. Sodium was found to be 119 and due to increased free water intake.  She was started on normal saline and sodium levels were trended.  She had good gradual increase in sodium and was tolerating a diet easily prior to discharge.  Sodium at time of discharge was 130.  She was considered stable for discharging home.

## 2022-01-04 NOTE — Discharge Summary (Signed)
Physician Discharge Summary   Patient: Jenny Martin MRN: [id number] DOB: [birth year]  Admit date:     01/02/2022  Discharge date: 01/04/22  Discharge Physician: Dwyane Dee   PCP: Houston Siren., MD   Recommendations at discharge:    Continue routine outpatient care  Discharge Diagnoses: Principal Problem:   Hyponatremia Active Problems:   Hypercholesterolemia   Morbid obesity with BMI of 50.0-59.9, adult (HCC)   HTN (hypertension), benign   Hypomagnesemia   Normocytic anemia  Resolved Problems:   * No resolved hospital problems. *   Hospital Course: Jenny Martin is a 76 y.o. female with medical history significant of HTN, HLD, edema.  She presented with nausea and vomiting.  She had recently started taking an oral bowel prep at home followed by excessive amounts of water due to lack of effect.  When she started developing nausea and vomiting, she presented to the ER for further evaluation. Sodium was found to be 119 and due to increased free water intake.  She was started on normal saline and sodium levels were trended.  She had good gradual increase in sodium and was tolerating a diet easily prior to discharge.  Sodium at time of discharge was 130.  She was considered stable for discharging home.    Consultants:  Procedures performed:   Disposition: Home Diet recommendation:  Discharge Diet Orders (From admission, onward)     Start     Ordered   01/04/22 0000  Diet general        01/04/22 1212           Regular diet  DISCHARGE MEDICATION: Allergies as of 01/04/2022       Reactions   Ibuprofen Hives   Statins Other (See Comments)   cramping        Medication List     STOP taking these medications    azithromycin 250 MG tablet Commonly known as: ZITHROMAX   cephALEXin 250 MG capsule Commonly known as: KEFLEX   doxycycline 100 MG capsule Commonly known as: VIBRAMYCIN   guaiFENesin-codeine 100-10 MG/5ML syrup   hydrocortisone cream 0.5 %        TAKE these medications    acetaminophen 500 MG tablet Commonly known as: TYLENOL Take 500 mg by mouth every 6 (six) hours as needed for moderate pain. For pain   albuterol 108 (90 Base) MCG/ACT inhaler Commonly known as: VENTOLIN HFA Inhale 2 puffs into the lungs every 6 (six) hours as needed for shortness of breath or wheezing.   aspirin 325 MG tablet Take 325 mg by mouth daily as needed for moderate pain.   CITRACAL/VITAMIN D PO Take 2 tablets by mouth daily.   ezetimibe 10 MG tablet Commonly known as: ZETIA Take 10 mg by mouth daily.   fluticasone 50 MCG/ACT nasal spray Commonly known as: FLONASE Place 1 spray into both nostrils daily as needed for allergies.   IRON PO Take 1 tablet by mouth every other day.   magnesium oxide 400 MG tablet Commonly known as: MAG-OX Take 1 tablet by mouth every other day.   Multivitamin Adult Tabs Take 1 tablet by mouth daily.   triamcinolone cream 0.1 % Commonly known as: KENALOG Apply 1 application topically 2 (two) times daily as needed (rash).   triamterene-hydrochlorothiazide 37.5-25 MG tablet Commonly known as: MAXZIDE-25 Take 1 tablet by mouth every morning.         Discharge Exam: Filed Weights   01/02/22 1717  Weight: 131.1 kg   Physical Exam  Constitutional:      General: She is not in acute distress.    Appearance: Normal appearance. She is not ill-appearing.  HENT:     Head: Normocephalic and atraumatic.     Mouth/Throat:     Mouth: Mucous membranes are moist.  Eyes:     Extraocular Movements: Extraocular movements intact.  Cardiovascular:     Rate and Rhythm: Normal rate and regular rhythm.  Pulmonary:     Effort: Pulmonary effort is normal.     Breath sounds: Normal breath sounds.  Abdominal:     General: Bowel sounds are normal. There is no distension.     Palpations: Abdomen is soft.     Tenderness: There is no abdominal tenderness.  Musculoskeletal:        General: Normal range of  motion.     Cervical back: Normal range of motion.  Skin:    General: Skin is warm and dry.  Neurological:     General: No focal deficit present.     Mental Status: She is alert.  Psychiatric:        Mood and Affect: Mood normal.        Behavior: Behavior normal.     Condition at discharge: stable  The results of significant diagnostics from this hospitalization (including imaging, microbiology, ancillary and laboratory) are listed below for reference.   Imaging Studies: CT Angio Chest PE W/Cm &/Or Wo Cm  Result Date: 01/02/2022 CLINICAL DATA:  Chest pain and shortness of breath EXAM: CT ANGIOGRAPHY CHEST WITH CONTRAST TECHNIQUE: Multidetector CT imaging of the chest was performed using the standard protocol during bolus administration of intravenous contrast. Multiplanar CT image reconstructions and MIPs were obtained to evaluate the vascular anatomy. RADIATION DOSE REDUCTION: This exam was performed according to the departmental dose-optimization program which includes automated exposure control, adjustment of the mA and/or kV according to patient size and/or use of iterative reconstruction technique. CONTRAST:  182mL OMNIPAQUE IOHEXOL 350 MG/ML SOLN COMPARISON:  Chest x-ray from earlier in the same day. FINDINGS: Cardiovascular: Thoracic aorta shows a normal branching pattern. No aneurysmal dilatation or dissection is noted. Mild stable cardiomegaly is noted. The pulmonary artery as visualized shows no filling defect to suggest pulmonary embolism. No significant coronary calcifications are noted. Mediastinum/Nodes: Thoracic inlet is within normal limits. No sizable hilar or mediastinal adenopathy is noted. The esophagus as visualized is within normal limits. Lungs/Pleura: Lungs are well aerated bilaterally. No focal infiltrate or sizable effusion is seen. No sizable parenchymal nodules are noted. Upper Abdomen: Visualized upper abdomen shows a vague hypodensity in the left lobe of the liver  medially with mild peripheral enhancement. This in combination with the previous CT suggests an underlying hemangioma. Hepatic cysts are noted within the liver as well. The remainder of the upper abdomen is within normal limits. Musculoskeletal: Mild degenerative changes of the thoracic spine are noted. No acute rib abnormality is noted. Review of the MIP images confirms the above findings. IMPRESSION: No evidence of pulmonary emboli. No focal abnormality within the lungs. 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 Electronically Signed   By: Inez Catalina M.D.   On: 01/02/2022 21:52   CT ABDOMEN PELVIS W CONTRAST  Result Date: 01/02/2022 CLINICAL DATA:  76 year old female with abdominal pain. "Taking a colon cleanser for 3 days with no bowel movement?. Left side abdominal pain and nausea. EXAM: CT ABDOMEN AND PELVIS WITH CONTRAST TECHNIQUE: Multidetector CT imaging of the abdomen and pelvis was performed using the standard protocol following  bolus administration of intravenous contrast. RADIATION DOSE REDUCTION: This exam was performed according to the departmental dose-optimization program which includes automated exposure control, adjustment of the mA and/or kV according to patient size and/or use of iterative reconstruction technique. CONTRAST:  149mL OMNIPAQUE IOHEXOL 300 MG/ML  SOLN COMPARISON:  CTA chest 10/20/2016. FINDINGS: Lower chest: 2017 left upper lobe pneumonia appears resolved. Chronic elevation of the left hemidiaphragm not significantly changed. Mild chronic cardiomegaly. No pericardial or pleural effusion. Hepatobiliary: Scattered small circumscribed low-density benign hepatic cysts, including up to 2 cm at the liver dome, are stable since 2017. Negative gallbladder. No bile duct enlargement. Pancreas: Negative. Spleen: Negative. Adrenals/Urinary Tract: Normal adrenal glands. Nonobstructed kidneys with symmetric renal enhancement. No nephrolithiasis or pararenal inflammation. Small right  renal midpole low-density cyst (series 2, image 47) is new since 2017 but appears benign. No hydroureter. Unremarkable bladder. Stomach/Bowel: Small volume of retained stool in the rectosigmoid colon. Descending colon decompressed. Redundant transverse colon and right colon also with some retained stool. Cecum on a lax mesentery in the midline. Normal appendix on coronal image 92. No large bowel inflammation. Decompressed terminal ileum. No dilated small bowel. Small fat containing umbilical hernia. Stomach and duodenum within normal limits. No free air, free fluid, or mesenteric inflammation identified. Vascular/Lymphatic: Mildly tortuous aorta and iliac arteries. No calcified atherosclerosis or lymphadenopathy identified. Reproductive: Coarsely calcified 4.1 cm uterine body fibroid. Otherwise negative. Other: No pelvic free fluid. Musculoskeletal: Advanced disc degeneration at the lumbosacral junction and widespread lumbar facet arthropathy. No acute or suspicious osseous lesion. IMPRESSION: 1. No acute or inflammatory process identified in the abdomen or pelvis. 2. Fibroid uterus. Chronic benign hepatic cysts. Small fat containing umbilical hernia. 3. Chronic mild cardiomegaly. A 2017 left upper lobe pneumonia resolved. Electronically Signed   By: Genevie Ann M.D.   On: 01/02/2022 04:23   DG Chest Port 1 View  Result Date: 01/02/2022 CLINICAL DATA:  Patient reports burning in her chest and some shortness of breath. EXAM: PORTABLE CHEST 1 VIEW COMPARISON:  Chest radiograph 11/10/2016 FINDINGS: Stable cardiomegaly. Unchanged elevation of the left hemidiaphragm. Bibasilar subsegmental atelectasis. No pleural effusion or pneumothorax. Visualized osseous structures are unremarkable. IMPRESSION: No acute cardiopulmonary abnormality.  Stable cardiomegaly. Electronically Signed   By: Ileana Roup M.D.   On: 01/02/2022 20:32    Microbiology: Results for orders placed or performed during the hospital encounter of  01/02/22  Resp Panel by RT-PCR (Flu A&B, Covid) Nasopharyngeal Swab     Status: None   Collection Time: 01/02/22  8:19 PM   Specimen: Nasopharyngeal Swab; Nasopharyngeal(NP) swabs in vial transport medium  Result Value Ref Range Status   SARS Coronavirus 2 by RT PCR NEGATIVE NEGATIVE Final    Comment: (NOTE) SARS-CoV-2 target nucleic acids are NOT DETECTED.  The SARS-CoV-2 RNA is generally detectable in upper respiratory specimens during the acute phase of infection. The lowest concentration of SARS-CoV-2 viral copies this assay can detect is 138 copies/mL. A negative result does not preclude SARS-Cov-2 infection and should not be used as the sole basis for treatment or other patient management decisions. A negative result may occur with  improper specimen collection/handling, submission of specimen other than nasopharyngeal swab, presence of viral mutation(s) within the areas targeted by this assay, and inadequate number of viral copies(<138 copies/mL). A negative result must be combined with clinical observations, patient history, and epidemiological information. The expected result is Negative.  Fact Sheet for Patients:  EntrepreneurPulse.com.au  Fact Sheet for Healthcare Providers:  IncredibleEmployment.be  This test is no t yet approved or cleared by the [place] and  has been authorized for detection and/or diagnosis of SARS-CoV-2 by FDA under an Emergency Use Authorization (EUA). This EUA will remain  in effect (meaning this test can be used) for the duration of the COVID-19 declaration under Section 564(b)(1) of the Act, 21 U.S.C.section 360bbb-3(b)(1), unless the authorization is terminated  or revoked sooner.       Influenza A by PCR NEGATIVE NEGATIVE Final   Influenza B by PCR NEGATIVE NEGATIVE Final    Comment: (NOTE) The Xpert Xpress SARS-CoV-2/FLU/RSV plus assay is intended as an aid in the diagnosis of influenza from  Nasopharyngeal swab specimens and should not be used as a sole basis for treatment. Nasal washings and aspirates are unacceptable for Xpert Xpress SARS-CoV-2/FLU/RSV testing.  Fact Sheet for Patients: EntrepreneurPulse.com.au  Fact Sheet for Healthcare Providers: IncredibleEmployment.be  This test is not yet approved or cleared by the [place] FDA and has been authorized for detection and/or diagnosis of SARS-CoV-2 by FDA under an Emergency Use Authorization (EUA). This EUA will remain in effect (meaning this test can be used) for the duration of the COVID-19 declaration under Section 564(b)(1) of the Act, 21 U.S.C. section 360bbb-3(b)(1), unless the authorization is terminated or revoked.  Performed at [facility], [address] [place], [place] [address]     Labs: CBC: Recent Labs  Lab 01/02/22 0247 01/02/22 1730 01/03/22 1428 01/04/22 0031  WBC 5.0 5.0 4.5 4.8  NEUTROABS  --  4.1 2.8  --   HGB 11.0* 11.2* 11.4* 9.6*  HCT 32.4* 32.2* 33.0* 28.6*  MCV 87.3 85.4 85.7 88.3  PLT 230 203 252 [id number]   Basic Metabolic Panel: Recent Labs  Lab 01/03/22 1428 01/03/22 1828 01/04/22 0031 01/04/22 0604 01/04/22 1132  NA 125* 129* 128* 128* 130*  K 3.8 3.5 3.8 3.9 3.8  CL 89* 93* 95* 94* 96*  CO2 27 28 29 26 28   GLUCOSE 97 98 92 99 95  BUN 12 14 16 16 16   CREATININE 0.71 0.88 0.85 0.81 0.79  CALCIUM 9.3 9.1 8.8* 9.0 9.0  MG 1.6*  --   --   --   --   PHOS  --  3.0 3.6 3.8  --    Liver Function Tests: Recent Labs  Lab 01/02/22 0247 01/02/22 1730 01/03/22 1428 01/03/22 1828 01/04/22 0031 01/04/22 0604  AST 35 39 38  --   --   --   ALT 27 26 28   --   --   --   ALKPHOS 43 44 42  --   --   --   BILITOT 0.4 0.2* 0.5  --   --   --   PROT 8.4* 8.5* 8.5*  --   --   --   ALBUMIN 3.3* 3.5 3.4* 3.0* 2.8* 3.3*   CBG: No results for input(s): GLUCAP in the last 168 hours.  Discharge time spent: greater than 30  minutes.  Signed: Dwyane Dee, MD [facility] 01/04/2022

## 2022-01-04 NOTE — TOC Progression Note (Signed)
Transition of Care [facility]) - Progression Note    Patient Details  Name: Jenny Martin MRN: [id number] Date of Birth: 1945-11-29  Transition [facility] Care [facility]) CM/SW Contact  Purcell Mouton, RN Phone Number: 01/04/2022, 1:45 PM  Clinical Narrative:      Transition of Care (TOC) Screening Note   Patient Details  Name: Jenny Martin Date of Birth: 05-31-1946   Transition [facility] Care [facility]) CM/SW Contact:    Purcell Mouton, RN Phone Number: 01/04/2022, 1:45 PM    Transition of Care Department [facility]) has reviewed patient and no TOC needs have been identified at this time. We will continue to monitor patient advancement through interdisciplinary progression rounds. If new patient transition needs arise, please place a TOC consult.         Expected Discharge Plan and Services           Expected Discharge Date: 01/04/22                                     Social Determinants of Health (SDOH) Interventions    Readmission Risk Interventions No flowsheet data found.

## 2022-01-08 DIAGNOSIS — E871 Hypo-osmolality and hyponatremia: Secondary | ICD-10-CM

## 2022-01-08 HISTORY — DX: Hypo-osmolality and hyponatremia: E87.1

## 2022-05-21 ENCOUNTER — Emergency Department (HOSPITAL_BASED_OUTPATIENT_CLINIC_OR_DEPARTMENT_OTHER)
Admission: EM | Admit: 2022-05-21 | Discharge: 2022-05-21 | Disposition: A | Discharge status: Home / Self Care | Payer: Medicare Other | Attending: Emergency Medicine | Admitting: Emergency Medicine

## 2022-05-21 ENCOUNTER — Other Ambulatory Visit: Payer: Self-pay

## 2022-05-21 ENCOUNTER — Emergency Department (HOSPITAL_BASED_OUTPATIENT_CLINIC_OR_DEPARTMENT_OTHER): Payer: Medicare Other

## 2022-05-21 ENCOUNTER — Encounter (HOSPITAL_BASED_OUTPATIENT_CLINIC_OR_DEPARTMENT_OTHER): Payer: Self-pay

## 2022-05-21 DIAGNOSIS — R609 Edema, unspecified: Secondary | ICD-10-CM | POA: Insufficient documentation

## 2022-05-21 DIAGNOSIS — R2243 Localized swelling, mass and lump, lower limb, bilateral: Secondary | ICD-10-CM | POA: Diagnosis not present

## 2022-05-21 DIAGNOSIS — R42 Dizziness and giddiness: Secondary | ICD-10-CM | POA: Diagnosis not present

## 2022-05-21 DIAGNOSIS — R600 Localized edema: Secondary | ICD-10-CM | POA: Insufficient documentation

## 2022-05-21 DIAGNOSIS — Z7982 Long term (current) use of aspirin: Secondary | ICD-10-CM | POA: Diagnosis not present

## 2022-05-21 DIAGNOSIS — I10 Essential (primary) hypertension: Secondary | ICD-10-CM | POA: Diagnosis not present

## 2022-05-21 HISTORY — DX: Essential (primary) hypertension: I10

## 2022-05-21 LAB — BASIC METABOLIC PANEL
Anion gap: 9 (ref 5–15)
BUN: 16 mg/dL (ref 8–23)
CO2: 25 mmol/L (ref 22–32)
Calcium: 9.5 mg/dL (ref 8.9–10.3)
Chloride: 97 mmol/L — ABNORMAL LOW (ref 98–111)
Creatinine, Ser: 0.76 mg/dL (ref 0.44–1.00)
GFR, Estimated: >60 mL/min (ref >60)
Glucose, Bld: 95 mg/dL (ref 70–99)
Potassium: 4 mmol/L (ref 3.5–5.1)
Sodium: 131 mmol/L — ABNORMAL LOW (ref 135–145)

## 2022-05-21 LAB — CBC
HCT: 32.3 % — ABNORMAL LOW (ref 36.0–46.0)
Hemoglobin: 10.8 g/dL — ABNORMAL LOW (ref 12.0–15.0)
MCH: 29.7 pg (ref 26.0–34.0)
MCHC: 33.4 g/dL (ref 30.0–36.0)
MCV: 88.7 fL (ref 80.0–100.0)
Platelets: 216 10*3/uL (ref 150–400)
RBC: 3.64 MIL/uL — ABNORMAL LOW (ref 3.87–5.11)
RDW: 13 % (ref 11.5–15.5)
WBC: 4.7 10*3/uL (ref 4.0–10.5)
nRBC: 0 % (ref 0.0–0.2)

## 2022-05-21 LAB — TROPONIN I (HIGH SENSITIVITY): Troponin I (High Sensitivity): 7 ng/L (ref <18)

## 2022-05-21 LAB — BRAIN NATRIURETIC PEPTIDE: B Natriuretic Peptide: 35.9 pg/mL (ref 0.0–100.0)

## 2022-05-21 MED ORDER — POTASSIUM CHLORIDE ER 10 MEQ PO TBCR: 10.0000 meq | EXTENDED_RELEASE_TABLET | Freq: Every day | ORAL | 0 refills | Status: DC

## 2022-05-21 MED ORDER — FUROSEMIDE 40 MG PO TABS: 40.0000 mg | ORAL_TABLET | Freq: Every day | ORAL | 0 refills | Status: DC

## 2022-05-21 NOTE — ED Provider Notes (Signed)
[facility] EMERGENCY DEPARTMENT Provider Note   CSN: [id number] Arrival date & time: 05/21/22  1709     History  Chief Complaint  Patient presents with   Hypertension    Jenny Martin is a 76 y.o. female.   Hypertension  Patient presents worried with hypertension.  States blood pressure has been elevated to the 180s.  States she has been on her diuretic for a while now although 2 months ago they switched the brands that she was getting.  States since then she feels as if she has not been urinating enough.  States more swelling in her legs.  Will have dull chest pain at times.  States she does not have chest pain but felt that she was about to get chest pain.  In the anterior chest.  More fatigue.  States she previously used to urinate more and is worried.  Has been feeling a little dizzy today.  Do not feel her heart racing.     Home Medications Prior to Admission medications   Medication Sig Start Date End Date Taking? Authorizing Provider  furosemide (LASIX) 40 MG tablet Take 1 tablet (40 mg total) by mouth daily. 05/21/22  Yes Benjiman Core, MD  potassium chloride (KLOR-CON) 10 MEQ tablet Take 1 tablet (10 mEq total) by mouth daily. 05/21/22  Yes Benjiman Core, MD  acetaminophen (TYLENOL) 500 MG tablet Take 500 mg by mouth every 6 (six) hours as needed for moderate pain. For pain    [provider]  albuterol (VENTOLIN HFA) 108 (90 Base) MCG/ACT inhaler Inhale 2 puffs into the lungs every 6 (six) hours as needed for shortness of breath or wheezing. 08/12/21   [provider]  aspirin 325 MG tablet Take 325 mg by mouth daily as needed for moderate pain.    [provider]  Calcium Citrate-Vitamin D (CITRACAL/VITAMIN D PO) Take 2 tablets by mouth daily.    [provider]  ezetimibe (ZETIA) 10 MG tablet Take 10 mg by mouth daily. 10/31/21   [provider]  Ferrous Sulfate (IRON PO) Take 1 tablet by mouth every other day.     [provider]  fluticasone (FLONASE) 50 MCG/ACT nasal spray Place 1 spray into both nostrils daily as needed for allergies.    [provider]  magnesium oxide (MAG-OX) 400 MG tablet Take 1 tablet by mouth every other day. 12/03/21   [provider]  Multiple Vitamin (MULTIVITAMIN ADULT) TABS Take 1 tablet by mouth daily.    [provider]  triamcinolone cream (KENALOG) 0.1 % Apply 1 application topically 2 (two) times daily as needed (rash).    [provider]  triamterene-hydrochlorothiazide (MAXZIDE-25) 37.5-25 MG tablet Take 1 tablet by mouth every morning. 12/25/21   [provider]      Allergies    Ibuprofen and Statins    Review of Systems   Review of Systems  Physical Exam Updated Vital Signs BP (!) 141/67   Pulse 60   Temp 98.2 F (36.8 C) (Oral)   Resp 16   Wt 127 kg   SpO2 99%   BMI 46.59 kg/m  Physical Exam Vitals and nursing note reviewed.  Constitutional:      Appearance: She is obese.  HENT:     Head: Normocephalic.  Eyes:     Pupils: Pupils are equal, round, and reactive to light.  Cardiovascular:     Rate and Rhythm: Normal rate.  Pulmonary:     Breath sounds: No  wheezing or rhonchi.  Abdominal:     Tenderness: There is no abdominal tenderness.  Musculoskeletal:     Cervical back: Neck supple.     Right lower leg: Edema present.     Left lower leg: Edema present.     Comments: Pitting edema bilateral lower extremities.  Skin:    Capillary Refill: Capillary refill takes less than 2 seconds.  Neurological:     Mental Status: She is alert and oriented to person, place, and time.     ED Results / Procedures / Treatments   Labs (all labs ordered are listed, but only abnormal results are displayed) Labs Reviewed  BASIC METABOLIC PANEL - Abnormal; Notable for the following components:      Result Value   Sodium 131 (*)    Chloride 97 (*)    All other components within normal limits  CBC -  Abnormal; Notable for the following components:   RBC 3.64 (*)    Hemoglobin 10.8 (*)    HCT 32.3 (*)    All other components within normal limits  BRAIN NATRIURETIC PEPTIDE  TROPONIN I (HIGH SENSITIVITY)  TROPONIN I (HIGH SENSITIVITY)    EKG EKG Interpretation  Date/Time:  Friday May 21 2022 17:40:40 EDT Ventricular Rate:  76 PR Interval:  222 QRS Duration: 102 QT Interval:  404 QTC Calculation: 454 R Axis:   -1 Text Interpretation: Sinus rhythm with 1st degree A-V block Left ventricular hypertrophy with repolarization abnormality ( R in aVL ) Abnormal ECG When compared with ECG of 02-Jan-2022 17:19, No significant change since last tracing Confirmed by Benjiman Core [phone]) on 05/21/2022 8:24:56 PM  Radiology DG Chest 2 View  Result Date: 05/21/2022 CLINICAL DATA:  Hypertension EXAM: CHEST - 2 VIEW COMPARISON:  01/02/2022 FINDINGS: Elevation of left diaphragm as before. No consolidation, pleural effusion or pneumothorax. Stable cardiomediastinal silhouette. Borderline to mild cardiomegaly. IMPRESSION: No active cardiopulmonary disease. Electronically Signed   By: Jasmine Pang M.D.   On: 05/21/2022 17:50    Procedures Procedures    Medications Ordered in ED Medications - No data to display  ED Course/ Medical Decision Making/ A&P                           Medical Decision Making Amount and/or Complexity of Data Reviewed Labs: ordered. Radiology: ordered.  Risk Prescription drug management.  Patient presents with increasing edema in her legs and increasing swelling.  Felt a little dizzy.  More shortness of breath particular with lying down.  Differential diagnosis includes CHF, pneumonia, cardiac cause.  Patient's thinks it is from changing the source of her blood pressure medicine.  States he came for a different company and now does not work as well.  Chest x-ray independently interpreted and reassuring however has a fair amount of edema on her legs.  Negative BNP.   We will give 3-day course of Lasix to help get off some of the edema.  Does not appear to require admission to the hospital.  Negative troponin.  However will also supplement potassium and will hold patient's other diuretics.  Patient will follow with PCP        Final Clinical Impression(s) / ED Diagnoses Final diagnoses:  Hypertension, unspecified type  Peripheral edema    Rx / DC Orders ED Discharge Orders          Ordered    furosemide (LASIX) 40 MG tablet  Daily  05/21/22 2250    potassium chloride (KLOR-CON) 10 MEQ tablet  Daily        05/21/22 2250              Benjiman Core, MD 05/21/22 2346

## 2022-05-21 NOTE — ED Triage Notes (Addendum)
Pt reports her blood pressure  has been high systolic in 180's and became dizzy today . Called EMS  today  and transport was not needed at that time Reports BP medication was changed 5/15 and does not feel BP medication is working due to swelling in feet for 2 weeks.Pharmacy changed manufacturer of her BP pill which is the reason for problmsv Still feels mild dizziness and the need to belch

## 2022-05-21 NOTE — Discharge Instructions (Addendum)
Do not take your triamterene hydrochlorothiazide while you are taking the Lasix.  Follow-up with your doctor for recheck of the labs

## 2022-06-08 DIAGNOSIS — K219 Gastro-esophageal reflux disease without esophagitis: Secondary | ICD-10-CM

## 2022-06-08 DIAGNOSIS — M270 Developmental disorders of jaws: Secondary | ICD-10-CM

## 2022-06-08 HISTORY — DX: Gastro-esophageal reflux disease without esophagitis: K21.9

## 2022-06-08 HISTORY — DX: Developmental disorders of jaws: M27.0

## 2022-07-18 ENCOUNTER — Encounter (HOSPITAL_BASED_OUTPATIENT_CLINIC_OR_DEPARTMENT_OTHER): Payer: Self-pay | Admitting: Emergency Medicine

## 2022-07-18 ENCOUNTER — Emergency Department (HOSPITAL_BASED_OUTPATIENT_CLINIC_OR_DEPARTMENT_OTHER)
Admission: EM | Admit: 2022-07-18 | Discharge: 2022-07-18 | Disposition: A | Discharge status: Home / Self Care | Payer: Medicare Other | Attending: Emergency Medicine | Admitting: Emergency Medicine

## 2022-07-18 DIAGNOSIS — M7981 Nontraumatic hematoma of soft tissue: Secondary | ICD-10-CM | POA: Diagnosis not present

## 2022-07-18 DIAGNOSIS — T148XXA Other injury of unspecified body region, initial encounter: Secondary | ICD-10-CM

## 2022-07-18 DIAGNOSIS — Z7982 Long term (current) use of aspirin: Secondary | ICD-10-CM | POA: Diagnosis not present

## 2022-07-18 NOTE — ED Notes (Signed)
Patient  has hematoma on inside of rt arm .denies any injury. Purple in color. Hematoma is the side of a quater

## 2022-07-18 NOTE — ED Triage Notes (Signed)
Pt reports small hematoma to LFA; noticed this afternoon; denies injury

## 2022-07-18 NOTE — Discharge Instructions (Signed)
Use warm compresses to the area.  Follow-up with your doctor in 2 to 3 days for recheck.  Return to emergency room if you have any worsening symptoms such as increased pain, swelling, redness, fevers or other worsening symptoms.

## 2022-07-18 NOTE — ED Provider Notes (Signed)
[facility] EMERGENCY DEPARTMENT Provider Note   CSN: [id number] Arrival date & time: 07/18/22  1816     History  Chief Complaint  Patient presents with   Hematoma    Jadwiga Faidley is a 76 y.o. female.  Patient is a 76 year old female who presents with a knot on her right forearm.  She denies any injuries.  She noted it today.  Its not tender.  She is on aspirin but no other anticoagulants.  She says she commonly has little bruise spots come up but normally its not as big as this 1.  No fevers.       Home Medications Prior to Admission medications   Medication Sig Start Date End Date Taking? Authorizing Provider  acetaminophen (TYLENOL) 500 MG tablet Take 500 mg by mouth every 6 (six) hours as needed for moderate pain. For pain    [provider]  albuterol (VENTOLIN HFA) 108 (90 Base) MCG/ACT inhaler Inhale 2 puffs into the lungs every 6 (six) hours as needed for shortness of breath or wheezing. 08/12/21   [provider]  aspirin 325 MG tablet Take 325 mg by mouth daily as needed for moderate pain.    [provider]  Calcium Citrate-Vitamin D (CITRACAL/VITAMIN D PO) Take 2 tablets by mouth daily.    [provider]  ezetimibe (ZETIA) 10 MG tablet Take 10 mg by mouth daily. 10/31/21   [provider]  Ferrous Sulfate (IRON PO) Take 1 tablet by mouth every other day.    [provider]  fluticasone (FLONASE) 50 MCG/ACT nasal spray Place 1 spray into both nostrils daily as needed for allergies.    [provider]  furosemide (LASIX) 40 MG tablet Take 1 tablet (40 mg total) by mouth daily. 05/21/22   Benjiman Core, MD  magnesium oxide (MAG-OX) 400 MG tablet Take 1 tablet by mouth every other day. 12/03/21   [provider]  Multiple Vitamin (MULTIVITAMIN ADULT) TABS Take 1 tablet by mouth daily.    [provider]  potassium chloride (KLOR-CON) 10 MEQ tablet Take 1 tablet (10 mEq total) by  mouth daily. 05/21/22   Benjiman Core, MD  triamcinolone cream (KENALOG) 0.1 % Apply 1 application topically 2 (two) times daily as needed (rash).    [provider]  triamterene-hydrochlorothiazide (MAXZIDE-25) 37.5-25 MG tablet Take 1 tablet by mouth every morning. 12/25/21   [provider]      Allergies    Ibuprofen, Losartan, and Statins    Review of Systems   Review of Systems  Constitutional:  Negative for fever.  Gastrointestinal:  Negative for nausea and vomiting.  Musculoskeletal:  Negative for arthralgias, back pain, joint swelling and neck pain.  Skin:  Positive for color change and wound.  Neurological:  Negative for weakness, numbness and headaches.    Physical Exam Updated Vital Signs BP (!) 163/80 (BP Location: Left Arm)   Pulse 95   Temp 98 F (36.7 C) (Oral)   Resp 18   Ht 5\' 6"  (1.676 m)   Wt 131.5 kg   SpO2 99%   BMI 46.81 kg/m  Physical Exam Constitutional:      Appearance: She is well-developed.  HENT:     Head: Normocephalic and atraumatic.  Cardiovascular:     Rate and Rhythm: Normal rate.  Pulmonary:     Effort: Pulmonary effort is normal.  Musculoskeletal:        General: No tenderness.     Cervical back: Normal  range of motion and neck supple.     Comments: There is a 1 cm knot that is ecchymotic to her right forearm on the volar surface.  It is adjacent to a small vein.  There is no tenderness.  No induration or fluctuance.  No warmth or erythema.  She is neurovascular intact distally.  No open wounds are noted.  Skin:    General: Skin is warm and dry.  Neurological:     Mental Status: She is alert and oriented to person, place, and time.     ED Results / Procedures / Treatments   Labs (all labs ordered are listed, but only abnormal results are displayed) Labs Reviewed - No data to display  EKG None  Radiology No results found.  Procedures Procedures    Medications Ordered in ED Medications - No data to  display  ED Course/ Medical Decision Making/ A&P                           Medical Decision Making  Patient is a 76 year old who presents with a small hematoma to the volar surface of her right forearm.  It is adjacent to a vein.  She denies any recent venipunctures.  It is nontender.  It is very superficial.  There is no diffuse swelling of the arm.  No concerns for DVT.  It does not appear infected.  It appears to be a hematoma.  Advised her to use warm compresses.  Advised her to follow-up with her PCP in 2 to 3 days for recheck.  Return precautions were given.  Final Clinical Impression(s) / ED Diagnoses Final diagnoses:  Hematoma    Rx / DC Orders ED Discharge Orders     None         Rolan Bucco, MD 07/18/22 1900

## 2022-09-23 ENCOUNTER — Encounter (HOSPITAL_BASED_OUTPATIENT_CLINIC_OR_DEPARTMENT_OTHER): Payer: Self-pay | Admitting: Emergency Medicine

## 2022-09-23 ENCOUNTER — Other Ambulatory Visit: Payer: Self-pay

## 2022-09-23 ENCOUNTER — Emergency Department (HOSPITAL_BASED_OUTPATIENT_CLINIC_OR_DEPARTMENT_OTHER): Payer: Medicare Other

## 2022-09-23 DIAGNOSIS — Z79899 Other long term (current) drug therapy: Secondary | ICD-10-CM

## 2022-09-23 DIAGNOSIS — Z825 Family history of asthma and other chronic lower respiratory diseases: Secondary | ICD-10-CM

## 2022-09-23 DIAGNOSIS — Z6841 Body Mass Index (BMI) 40.0 and over, adult: Secondary | ICD-10-CM

## 2022-09-23 DIAGNOSIS — R0602 Shortness of breath: Secondary | ICD-10-CM | POA: Diagnosis not present

## 2022-09-23 DIAGNOSIS — E871 Hypo-osmolality and hyponatremia: Secondary | ICD-10-CM | POA: Diagnosis not present

## 2022-09-23 DIAGNOSIS — Z7982 Long term (current) use of aspirin: Secondary | ICD-10-CM

## 2022-09-23 DIAGNOSIS — D649 Anemia, unspecified: Secondary | ICD-10-CM | POA: Diagnosis present

## 2022-09-23 DIAGNOSIS — E7800 Pure hypercholesterolemia, unspecified: Secondary | ICD-10-CM | POA: Diagnosis present

## 2022-09-23 DIAGNOSIS — I10 Essential (primary) hypertension: Secondary | ICD-10-CM | POA: Diagnosis present

## 2022-09-23 DIAGNOSIS — Z823 Family history of stroke: Secondary | ICD-10-CM

## 2022-09-23 NOTE — ED Triage Notes (Signed)
Patient arrived via POV c/o SHOB today. Patient states SHOB started earlier today, went away then returned 90 min pta. Patient also states swelling to legs. Patient denies chest pain at this time. Patient denies CHF. Patient denies pain at this time. Patient is AO x 4, VS w/ elevated BP, slow gait.

## 2022-09-24 ENCOUNTER — Encounter (HOSPITAL_COMMUNITY): Payer: Self-pay

## 2022-09-24 ENCOUNTER — Inpatient Hospital Stay (HOSPITAL_BASED_OUTPATIENT_CLINIC_OR_DEPARTMENT_OTHER)
Admission: EM | Admit: 2022-09-24 | Discharge: 2022-09-25 | DRG: 641 | Disposition: A | Discharge status: Home / Self Care | Payer: Medicare Other | Attending: Internal Medicine | Admitting: Internal Medicine

## 2022-09-24 DIAGNOSIS — Z79899 Other long term (current) drug therapy: Secondary | ICD-10-CM | POA: Diagnosis not present

## 2022-09-24 DIAGNOSIS — R600 Localized edema: Secondary | ICD-10-CM

## 2022-09-24 DIAGNOSIS — E871 Hypo-osmolality and hyponatremia: Secondary | ICD-10-CM | POA: Diagnosis present

## 2022-09-24 DIAGNOSIS — R0602 Shortness of breath: Secondary | ICD-10-CM | POA: Diagnosis present

## 2022-09-24 DIAGNOSIS — I10 Essential (primary) hypertension: Secondary | ICD-10-CM | POA: Diagnosis present

## 2022-09-24 DIAGNOSIS — Z825 Family history of asthma and other chronic lower respiratory diseases: Secondary | ICD-10-CM | POA: Diagnosis not present

## 2022-09-24 DIAGNOSIS — Z823 Family history of stroke: Secondary | ICD-10-CM | POA: Diagnosis not present

## 2022-09-24 DIAGNOSIS — R609 Edema, unspecified: Secondary | ICD-10-CM

## 2022-09-24 DIAGNOSIS — Z7982 Long term (current) use of aspirin: Secondary | ICD-10-CM | POA: Diagnosis not present

## 2022-09-24 DIAGNOSIS — E7800 Pure hypercholesterolemia, unspecified: Secondary | ICD-10-CM | POA: Diagnosis present

## 2022-09-24 DIAGNOSIS — D649 Anemia, unspecified: Secondary | ICD-10-CM | POA: Diagnosis present

## 2022-09-24 DIAGNOSIS — Z6841 Body Mass Index (BMI) 40.0 and over, adult: Secondary | ICD-10-CM | POA: Diagnosis not present

## 2022-09-24 LAB — CBC WITH DIFFERENTIAL/PLATELET
Abs Immature Granulocytes: 0.01 10*3/uL (ref 0.00–0.07)
Basophils Absolute: 0 10*3/uL (ref 0.0–0.1)
Basophils Relative: 0 %
Eosinophils Absolute: 0.1 10*3/uL (ref 0.0–0.5)
Eosinophils Relative: 2 %
HCT: 29.4 % — ABNORMAL LOW (ref 36.0–46.0)
Hemoglobin: 9.8 g/dL — ABNORMAL LOW (ref 12.0–15.0)
Immature Granulocytes: 0 %
Lymphocytes Relative: 11 %
Lymphs Abs: 0.6 10*3/uL — ABNORMAL LOW (ref 0.7–4.0)
MCH: 28.8 pg (ref 26.0–34.0)
MCHC: 33.3 g/dL (ref 30.0–36.0)
MCV: 86.5 fL (ref 80.0–100.0)
Monocytes Absolute: 0.6 10*3/uL (ref 0.1–1.0)
Monocytes Relative: 13 %
Neutro Abs: 3.7 10*3/uL (ref 1.7–7.7)
Neutrophils Relative %: 74 %
Platelets: 234 10*3/uL (ref 150–400)
RBC: 3.4 MIL/uL — ABNORMAL LOW (ref 3.87–5.11)
RDW: 13 % (ref 11.5–15.5)
WBC: 5 10*3/uL (ref 4.0–10.5)
nRBC: 0 % (ref 0.0–0.2)

## 2022-09-24 LAB — URINALYSIS, ROUTINE W REFLEX MICROSCOPIC
Bilirubin Urine: NEGATIVE
Glucose, UA: NEGATIVE mg/dL
Ketones, ur: NEGATIVE mg/dL
Leukocytes,Ua: NEGATIVE
Nitrite: NEGATIVE
Protein, ur: NEGATIVE mg/dL
Specific Gravity, Urine: 1.01 (ref 1.005–1.030)
pH: 7 (ref 5.0–8.0)

## 2022-09-24 LAB — SODIUM, URINE, RANDOM: Sodium, Ur: 47 mmol/L

## 2022-09-24 LAB — COMPREHENSIVE METABOLIC PANEL
ALT: 26 U/L (ref 0–44)
AST: 37 U/L (ref 15–41)
Albumin: 3.2 g/dL — ABNORMAL LOW (ref 3.5–5.0)
Alkaline Phosphatase: 45 U/L (ref 38–126)
Anion gap: 7 (ref 5–15)
BUN: 14 mg/dL (ref 8–23)
CO2: 27 mmol/L (ref 22–32)
Calcium: 9.2 mg/dL (ref 8.9–10.3)
Chloride: 93 mmol/L — ABNORMAL LOW (ref 98–111)
Creatinine, Ser: 0.7 mg/dL (ref 0.44–1.00)
GFR, Estimated: >60 mL/min (ref >60)
Glucose, Bld: 93 mg/dL (ref 70–99)
Potassium: 3.8 mmol/L (ref 3.5–5.1)
Sodium: 127 mmol/L — ABNORMAL LOW (ref 135–145)
Total Bilirubin: 0.6 mg/dL (ref 0.3–1.2)
Total Protein: 7.7 g/dL (ref 6.5–8.1)

## 2022-09-24 LAB — BASIC METABOLIC PANEL
Anion gap: 8 (ref 5–15)
Anion gap: 6 (ref 5–15)
BUN: 15 mg/dL (ref 8–23)
BUN: 13 mg/dL (ref 8–23)
CO2: 26 mmol/L (ref 22–32)
CO2: 28 mmol/L (ref 22–32)
Calcium: 8.9 mg/dL (ref 8.9–10.3)
Calcium: 8.9 mg/dL (ref 8.9–10.3)
Chloride: 87 mmol/L — ABNORMAL LOW (ref 98–111)
Chloride: 90 mmol/L — ABNORMAL LOW (ref 98–111)
Creatinine, Ser: 0.78 mg/dL (ref 0.44–1.00)
Creatinine, Ser: 0.69 mg/dL (ref 0.44–1.00)
GFR, Estimated: >60 mL/min (ref >60)
GFR, Estimated: >60 mL/min (ref >60)
Glucose, Bld: 100 mg/dL — ABNORMAL HIGH (ref 70–99)
Glucose, Bld: 97 mg/dL (ref 70–99)
Potassium: 4 mmol/L (ref 3.5–5.1)
Potassium: 4.1 mmol/L (ref 3.5–5.1)
Sodium: 121 mmol/L — ABNORMAL LOW (ref 135–145)
Sodium: 124 mmol/L — ABNORMAL LOW (ref 135–145)

## 2022-09-24 LAB — IRON AND TIBC
Iron: 44 ug/dL (ref 28–170)
Saturation Ratios: 13 % (ref 10.4–31.8)
TIBC: 353 ug/dL (ref 250–450)
UIBC: 309 ug/dL

## 2022-09-24 LAB — URINALYSIS, MICROSCOPIC (REFLEX)

## 2022-09-24 LAB — MAGNESIUM: Magnesium: 1.8 mg/dL (ref 1.7–2.4)

## 2022-09-24 LAB — BRAIN NATRIURETIC PEPTIDE: B Natriuretic Peptide: 60.9 pg/mL (ref 0.0–100.0)

## 2022-09-24 MED ORDER — SODIUM CHLORIDE 0.9 % IV SOLN: INTRAVENOUS | Status: DC

## 2022-09-24 MED ORDER — ENOXAPARIN SODIUM 80 MG/0.8ML IJ SOSY
70.0000 mg | PREFILLED_SYRINGE | INTRAMUSCULAR | Status: DC
  Administered 2022-09-24: 70 mg via SUBCUTANEOUS
  Filled 2022-09-24: qty 0.8

## 2022-09-24 MED ORDER — ACETAMINOPHEN 325 MG PO TABS: 650.0000 mg | ORAL_TABLET | Freq: Four times a day (QID) | ORAL | Status: DC | PRN

## 2022-09-24 MED ORDER — SODIUM CHLORIDE 0.9 % IV SOLN: Freq: Once | INTRAVENOUS | Status: AC

## 2022-09-24 MED ORDER — HYDRALAZINE HCL 20 MG/ML IJ SOLN
10.0000 mg | Freq: Three times a day (TID) | INTRAMUSCULAR | Status: DC | PRN
  Administered 2022-09-24: 10 mg via INTRAVENOUS
  Filled 2022-09-24: qty 1

## 2022-09-24 MED ORDER — ACETAMINOPHEN 650 MG RE SUPP: 650.0000 mg | Freq: Four times a day (QID) | RECTAL | Status: DC | PRN

## 2022-09-24 NOTE — ED Notes (Signed)
Pt ambulatory to RR without assistance, pt returned to bed and placed back on monitor. Call light in reach, pt repositioned in bed.

## 2022-09-24 NOTE — ED Notes (Signed)
Pt amb to BR

## 2022-09-24 NOTE — ED Notes (Signed)
Pt to RR without issues. Pt returned to bed provided with warm blanket and call light in reach.

## 2022-09-24 NOTE — H&P (Signed)
History and Physical    Patient: Jenny Martin DGL:[id number] DOB: 10-26-1946 DOA: 09/24/2022 DOS: the patient was seen and examined on 09/24/2022 PCP: Lester Fannett., MD  Patient coming from: Home  Chief Complaint:  Chief Complaint  Patient presents with   Shortness of Breath   HPI: Jenny Martin is a 76 y.o. female with medical history significant of HTN, HLD, morbid obesity. Presenting with shortness of breath. She reports that she felt a burning in her chest  a couple of days ago that was "almost like reflux". She felt nauseous like she was having a panic attack but she didn't vomit. She began to fell short of breath. These sensations continued intermittently through yesterday. She didn't have any chest pain or palpitations. She reports increased edema over the last several months but not acutely. When her symptoms did not improve yesterday, she decided to go to the ED for assistance.    Review of Systems: As mentioned in the history of present illness. All other systems reviewed and are negative. Past Medical History:  Diagnosis Date   Abnormal Pap smear 08/29/2008   Anemia    Arachnoid cyst    Atopic dermatitis    Blood transfusion without reported diagnosis    Chronic vulvitis    H/O bladder infections    High cholesterol    History of chicken pox    Hypercholesterolemia    Hypertension    Obese    Pneumonia, pneumococcal (HCC)    S/P thoracotomy    Past Surgical History:  Procedure Laterality Date   COLPOSCOPY  12/2008   normal   WRIST SURGERY     Social History:  reports that she has never smoked. She has never used smokeless tobacco. She reports that she does not drink alcohol and does not use drugs.  Allergies  Allergen Reactions   Ibuprofen Hives   Losartan Other (See Comments)    headache   Statins Other (See Comments)    cramping    Family History  Problem Relation Age of Onset   COPD Mother    Stroke Father     Prior to Admission medications    Medication Sig Start Date End Date Taking? Authorizing Provider  acetaminophen (TYLENOL) 500 MG tablet Take 500 mg by mouth every 6 (six) hours as needed for moderate pain. For pain    [provider]  albuterol (VENTOLIN HFA) 108 (90 Base) MCG/ACT inhaler Inhale 2 puffs into the lungs every 6 (six) hours as needed for shortness of breath or wheezing. 08/12/21   [provider]  aspirin 325 MG tablet Take 325 mg by mouth daily as needed for moderate pain.    [provider]  Calcium Citrate-Vitamin D (CITRACAL/VITAMIN D PO) Take 2 tablets by mouth daily.    [provider]  ezetimibe (ZETIA) 10 MG tablet Take 10 mg by mouth daily. 10/31/21   [provider]  Ferrous Sulfate (IRON PO) Take 1 tablet by mouth every other day.    [provider]  fluticasone (FLONASE) 50 MCG/ACT nasal spray Place 1 spray into both nostrils daily as needed for allergies.    [provider]  furosemide (LASIX) 40 MG tablet Take 1 tablet (40 mg total) by mouth daily. 05/21/22   Benjiman Core, MD  magnesium oxide (MAG-OX) 400 MG tablet Take 1 tablet by mouth every other day. 12/03/21   [provider]  Multiple Vitamin (MULTIVITAMIN ADULT) TABS Take 1 tablet by mouth daily.    [provider]  potassium chloride (KLOR-CON) 10 MEQ tablet Take 1 tablet (10 mEq total) by mouth daily. 05/21/22   Benjiman Core, MD  triamcinolone cream (KENALOG) 0.1 % Apply 1 application topically 2 (two) times daily as needed (rash).    [provider]  triamterene-hydrochlorothiazide (MAXZIDE-25) 37.5-25 MG tablet Take 1 tablet by mouth every morning. 12/25/21   [provider]    Physical Exam: Vitals:   09/24/22 1530 09/24/22 1609 09/24/22 1702 09/24/22 1704  BP:  137/71  (!) 188/76  Pulse: 65 72  67  Resp:  20  19  Temp:    (!) 97.3 F (36.3 C)  TempSrc:    Oral  SpO2: 98% 97%  97%  Weight:   (!) 136.4 kg   Height:   5\' 5"  (1.651  m)    General: 76 y.o. female resting in bed in NAD Eyes: PERRL, normal sclera ENMT: Nares patent w/o discharge, orophaynx clear, dentition normal, ears w/o discharge/lesions/ulcers Neck: Supple, trachea midline Cardiovascular: RRR, +S1, S2, no m/g/r, equal pulses throughout Respiratory: CTABL, no w/r/r, normal WOB GI: BS+, NDNT, no masses noted, no organomegaly noted MSK: No c/c; chronic BLE edema w/ venous changes  Neuro: A&O x 3, no focal deficits Psyc: Appropriate interaction and affect, calm/cooperative  Data Reviewed:  Results for orders placed or performed during the hospital encounter of 09/24/22 (from the past 24 hour(s))  CBC with Differential     Status: Abnormal   Collection Time: 09/24/22  2:06 AM  Result Value Ref Range   WBC 5.0 4.0 - 10.5 K/uL   RBC 3.40 (L) 3.87 - 5.11 MIL/uL   Hemoglobin 9.8 (L) 12.0 - 15.0 g/dL   HCT 13/10/23 (L) 71.0 - 62.6 %   MCV 86.5 80.0 - 100.0 fL   MCH 28.8 26.0 - 34.0 pg   MCHC 33.3 30.0 - 36.0 g/dL   RDW 94.8 54.6 - 27.0 %   Platelets 234 150 - 400 K/uL   nRBC 0.0 0.0 - 0.2 %   Neutrophils Relative % 74 %   Neutro Abs 3.7 1.7 - 7.7 K/uL   Lymphocytes Relative 11 %   Lymphs Abs 0.6 (L) 0.7 - 4.0 K/uL   Monocytes Relative 13 %   Monocytes Absolute 0.6 0.1 - 1.0 K/uL   Eosinophils Relative 2 %   Eosinophils Absolute 0.1 0.0 - 0.5 K/uL   Basophils Relative 0 %   Basophils Absolute 0.0 0.0 - 0.1 K/uL   Immature Granulocytes 0 %   Abs Immature Granulocytes 0.01 0.00 - 0.07 K/uL  Basic metabolic panel     Status: Abnormal   Collection Time: 09/24/22  2:06 AM  Result Value Ref Range   Sodium 121 (L) 135 - 145 mmol/L   Potassium 4.0 3.5 - 5.1 mmol/L   Chloride 87 (L) 98 - 111 mmol/L   CO2 26 22 - 32 mmol/L   Glucose, Bld 100 (H) 70 - 99 mg/dL   BUN 15 8 - 23 mg/dL   Creatinine, Ser 13/10/23 0.44 - 1.00 mg/dL   Calcium 8.9 8.9 - 0.93 mg/dL   GFR, Estimated 81.8 >29 mL/min   Anion gap 8 5 - 15  Brain natriuretic peptide     Status: None    Collection Time: 09/24/22  2:06 AM  Result Value Ref Range   B Natriuretic Peptide 60.9 0.0 - 100.0 pg/mL  Urinalysis, Routine w reflex microscopic Urine, Clean Catch     Status: Abnormal   Collection Time: 09/24/22  2:36 AM  Result Value Ref Range   Color, Urine YELLOW YELLOW   APPearance CLEAR CLEAR   Specific Gravity, Urine 1.010 1.005 - 1.030   pH 7.0 5.0 - 8.0   Glucose, UA NEGATIVE NEGATIVE mg/dL   Hgb urine dipstick MODERATE (A) NEGATIVE   Bilirubin Urine NEGATIVE NEGATIVE   Ketones, ur NEGATIVE NEGATIVE mg/dL   Protein, ur NEGATIVE NEGATIVE mg/dL   Nitrite NEGATIVE NEGATIVE   Leukocytes,Ua NEGATIVE NEGATIVE  Urinalysis, Microscopic (reflex)     Status: Abnormal   Collection Time: 09/24/22  2:36 AM  Result Value Ref Range   RBC / HPF 6-10 0 - 5 RBC/hpf   WBC, UA 0-5 0 - 5 WBC/hpf   Bacteria, UA RARE (A) NONE SEEN   Squamous Epithelial / LPF 0-5 0 - 5  Basic metabolic panel     Status: Abnormal   Collection Time: 09/24/22  8:35 AM  Result Value Ref Range   Sodium 124 (L) 135 - 145 mmol/L   Potassium 4.1 3.5 - 5.1 mmol/L   Chloride 90 (L) 98 - 111 mmol/L   CO2 28 22 - 32 mmol/L   Glucose, Bld 97 70 - 99 mg/dL   BUN 13 8 - 23 mg/dL   Creatinine, Ser 7.25 0.44 - 1.00 mg/dL   Calcium 8.9 8.9 - 36.6 mg/dL   GFR, Estimated >44 >03 mL/min   Anion gap 6 5 - 15   CXR: No active cardiopulmonary disease.   EKG: NSR, no st elevations  Assessment and Plan: Hyponatremia     - admit to inpt, tele     - she's chronically hypervolemic; but her numbers (Cl-, BUN, Scr) look like she is more dry and her Na+ started to improve with fluids     - UNa+ and Uosm ordered     - I think right now, we order some gentle fluids and track     - q6h renal function panels ordered; limit Na+ rise to 8pt/day  Normocytic anemia     - no evidence of bleed     - check TIBC  Morbid obesity     - continue outpt follow up  HTN     - continue home regimen when confirmed     - add PRN  hydralazine  HLD     - continue home regimen when confirmed  Advance Care Planning:   Code Status: FULL  Consults: None  Family Communication: None at bedside  Severity of Illness: The appropriate patient status for this patient is INPATIENT. Inpatient status is judged to be reasonable and necessary in order to provide the required intensity of service to ensure the patient's safety. The patient's presenting symptoms, physical exam findings, and initial radiographic and laboratory data in the context of their chronic comorbidities is felt to place them at high risk for further clinical deterioration. Furthermore, it is not anticipated that the patient will be medically stable for discharge from the hospital within 2 midnights of admission.   * I certify that at the point of admission it is my clinical judgment that the patient will require inpatient hospital care spanning beyond 2 midnights from the point of admission due to high intensity of service, high risk for further deterioration and high frequency of surveillance required.*  Author: Teddy Spike, DO 09/24/2022 5:06 PM  For on call review [email or web address].

## 2022-09-24 NOTE — ED Provider Notes (Signed)
[facility] Provider Note: [name], MD, FACEP  CSN: [id number] MRN: [id number] ARRIVAL: 09/23/22 at 2324 ROOM: MH05/[facility]05   CHIEF COMPLAINT  Shortness of Breath   HISTORY OF PRESENT ILLNESS  09/24/22 1:41 AM Jenny Martin is a 76 y.o. female shortness of breath that began early yesterday and waxed and waned throughout the day.  She states her shortness of breath resolved while in the waiting room and thinks it might have just been gas.  She is also having swelling in her legs for the past several months..  She denies chest pain.  She denies history of CHF though she takes 40 mg of Lasix daily.    Past Medical History:  Diagnosis Date   Abnormal Pap smear 08/29/2008   Anemia    Arachnoid cyst    Atopic dermatitis    Blood transfusion without reported diagnosis    Chronic vulvitis    H/O bladder infections    High cholesterol    History of chicken pox    Hypercholesterolemia    Hypertension    Obese    Pneumonia, pneumococcal (HCC)    S/P thoracotomy     Past Surgical History:  Procedure Laterality Date   COLPOSCOPY  12/2008   normal   WRIST SURGERY      Family History  Problem Relation Age of Onset   COPD Mother    Stroke Father     Social History   Tobacco Use   Smoking status: Never   Smokeless tobacco: Never  Vaping Use   Vaping Use: Never used  Substance Use Topics   Alcohol use: No   Drug use: No    Prior to Admission medications   Medication Sig Start Date End Date Taking? Authorizing Provider  acetaminophen (TYLENOL) 500 MG tablet Take 500 mg by mouth every 6 (six) hours as needed for moderate pain. For pain    [provider]  albuterol (VENTOLIN HFA) 108 (90 Base) MCG/ACT inhaler Inhale 2 puffs into the lungs every 6 (six) hours as needed for shortness of breath or wheezing. 08/12/21   [provider]  aspirin 325 MG tablet Take 325 mg by mouth daily as needed for moderate pain.    [provider]   Calcium Citrate-Vitamin D (CITRACAL/VITAMIN D PO) Take 2 tablets by mouth daily.    [provider]  ezetimibe (ZETIA) 10 MG tablet Take 10 mg by mouth daily. 10/31/21   [provider]  Ferrous Sulfate (IRON PO) Take 1 tablet by mouth every other day.    [provider]  fluticasone (FLONASE) 50 MCG/ACT nasal spray Place 1 spray into both nostrils daily as needed for allergies.    [provider]  furosemide (LASIX) 40 MG tablet Take 1 tablet (40 mg total) by mouth daily. 05/21/22   Benjiman Core, MD  magnesium oxide (MAG-OX) 400 MG tablet Take 1 tablet by mouth every other day. 12/03/21   [provider]  Multiple Vitamin (MULTIVITAMIN ADULT) TABS Take 1 tablet by mouth daily.    [provider]  potassium chloride (KLOR-CON) 10 MEQ tablet Take 1 tablet (10 mEq total) by mouth daily. 05/21/22   Benjiman Core, MD  triamcinolone cream (KENALOG) 0.1 % Apply 1 application topically 2 (two) times daily as needed (rash).    [provider]  triamterene-hydrochlorothiazide (MAXZIDE-25) 37.5-25 MG tablet Take 1 tablet by mouth every morning. 12/25/21   [provider]    Allergies Ibuprofen, Losartan, and Statins  REVIEW OF SYSTEMS  Negative except as noted here or in the History of Present Illness.   PHYSICAL EXAMINATION  Initial Vital Signs Blood pressure (!) 169/65, pulse 83, temperature 97.9 F (36.6 C), temperature source Oral, resp. rate 17, height 5\' 5"  (1.651 m), weight 131.5 kg, SpO2 100 %.  Examination General: Well-developed, well-nourished female in no acute distress; appearance consistent with age of record HENT: normocephalic; atraumatic Eyes: Normal appearance Neck: supple Heart: regular rate and rhythm Lungs: clear to auscultation bilaterally Abdomen: soft; nondistended; nontender; bowel sounds present Extremities: No deformity; full range of motion; +3 edema of lower legs Neurologic: Awake, alert  and oriented; motor function intact in all extremities and symmetric; no facial droop Skin: Warm and dry Psychiatric: Normal mood and affect   RESULTS  Summary of this visit's results, reviewed and interpreted by myself:   EKG Interpretation  Date/Time:  Thursday September 23 2022 23:40:16 EST Ventricular Rate:  71 PR Interval:  202 QRS Duration: 106 QT Interval:  414 QTC Calculation: 449 R Axis:   -4 Text Interpretation: Normal sinus rhythm Left ventricular hypertrophy Abnormal ECG No significant change was found Confirmed by [id number] (Paula Libra) on 09/23/2022 11:56:42 PM       Laboratory Studies: Results for orders placed or performed during the hospital encounter of 09/24/22 (from the past 24 hour(s))  CBC with Differential     Status: Abnormal   Collection Time: 09/24/22  2:06 AM  Result Value Ref Range   WBC 5.0 4.0 - 10.5 K/uL   RBC 3.40 (L) 3.87 - 5.11 MIL/uL   Hemoglobin 9.8 (L) 12.0 - 15.0 g/dL   HCT 13/10/23 (L) 43.8 - 38.1 %   MCV 86.5 80.0 - 100.0 fL   MCH 28.8 26.0 - 34.0 pg   MCHC 33.3 30.0 - 36.0 g/dL   RDW 84.0 37.5 - 43.6 %   Platelets 234 150 - 400 K/uL   nRBC 0.0 0.0 - 0.2 %   Neutrophils Relative % 74 %   Neutro Abs 3.7 1.7 - 7.7 K/uL   Lymphocytes Relative 11 %   Lymphs Abs 0.6 (L) 0.7 - 4.0 K/uL   Monocytes Relative 13 %   Monocytes Absolute 0.6 0.1 - 1.0 K/uL   Eosinophils Relative 2 %   Eosinophils Absolute 0.1 0.0 - 0.5 K/uL   Basophils Relative 0 %   Basophils Absolute 0.0 0.0 - 0.1 K/uL   Immature Granulocytes 0 %   Abs Immature Granulocytes 0.01 0.00 - 0.07 K/uL  Basic metabolic panel     Status: Abnormal   Collection Time: 09/24/22  2:06 AM  Result Value Ref Range   Sodium 121 (L) 135 - 145 mmol/L   Potassium 4.0 3.5 - 5.1 mmol/L   Chloride 87 (L) 98 - 111 mmol/L   CO2 26 22 - 32 mmol/L   Glucose, Bld 100 (H) 70 - 99 mg/dL   BUN 15 8 - 23 mg/dL   Creatinine, Ser 13/10/23 0.44 - 1.00 mg/dL   Calcium 8.9 8.9 - 7.03 mg/dL   GFR, Estimated  40.3 >52 mL/min   Anion gap 8 5 - 15  Brain natriuretic peptide     Status: None   Collection Time: 09/24/22  2:06 AM  Result Value Ref Range   B Natriuretic Peptide 60.9 0.0 - 100.0 pg/mL  Urinalysis, Routine w reflex microscopic Urine, Clean Catch     Status: Abnormal   Collection Time: 09/24/22  2:36 AM  Result Value Ref Range  Color, Urine YELLOW YELLOW   APPearance CLEAR CLEAR   Specific Gravity, Urine 1.010 1.005 - 1.030   pH 7.0 5.0 - 8.0   Glucose, UA NEGATIVE NEGATIVE mg/dL   Hgb urine dipstick MODERATE (A) NEGATIVE   Bilirubin Urine NEGATIVE NEGATIVE   Ketones, ur NEGATIVE NEGATIVE mg/dL   Protein, ur NEGATIVE NEGATIVE mg/dL   Nitrite NEGATIVE NEGATIVE   Leukocytes,Ua NEGATIVE NEGATIVE  Urinalysis, Microscopic (reflex)     Status: Abnormal   Collection Time: 09/24/22  2:36 AM  Result Value Ref Range   RBC / HPF 6-10 0 - 5 RBC/hpf   WBC, UA 0-5 0 - 5 WBC/hpf   Bacteria, UA RARE (A) NONE SEEN   Squamous Epithelial / LPF 0-5 0 - 5   Imaging Studies: DG Chest 2 View  Result Date: 09/24/2022 CLINICAL DATA:  Shortness of breath. EXAM: CHEST - 2 VIEW COMPARISON:  05/21/2022. FINDINGS: Heart is enlarged the mediastinal contour is within normal limits. There is chronic elevation of the left diaphragm with atelectasis at the left lung base. Stable scarring is noted in the lateral aspect of the left upper lobe. The right lung is clear. No effusion or pneumothorax. No acute osseous abnormality. IMPRESSION: No active cardiopulmonary disease. Electronically Signed   By: Thornell Sartorius M.D.   On: 09/24/2022 00:02    ED COURSE and MDM  Nursing notes, initial and subsequent vitals signs, including pulse oximetry, reviewed and interpreted by myself.  Vitals:   09/23/22 2331 09/23/22 2341 09/24/22 0155 09/24/22 0245  BP: (!) 169/65  (!) 148/75 (!) 143/73  Pulse: 83  71 73  Resp: 17  14 13   Temp: 97.9 F (36.6 C)     TempSrc: Oral     SpO2: 100%  99% 100%  Weight:  131.5 kg     Height:  5\' 5"  (1.651 m)     Medications  0.9 %  sodium chloride infusion (has no administration in time range)    3:04 AM Patient's sodium is 121.  It was 126 on 09/21/2022 at [facility].  It is unclear why it dropped so rapidly and it is also unclear why she had transient shortness of breath.  Her BNP is normal.  She does have significant peripheral edema but that is not acute.  3:34 AM Dr. 13/05/2022 accepts for admission to hospitalist service.  PROCEDURES  Procedures   ED DIAGNOSES     ICD-10-CM   1. Hyponatremia  E87.1     2. Shortness of breath  R06.02     3. Peripheral edema  R60.9          Garrett, MD 09/24/22 [phone]

## 2022-09-24 NOTE — ED Notes (Addendum)
Pt to RR at this time. Pt upset, reporting that she has had to use the restroom and "no one has helped me." Pt needs met at this time. Pt refused to be placed back on monitor.

## 2022-09-24 NOTE — ED Notes (Signed)
Pt ambulatory with  slow, steady gait to restroom unassisted

## 2022-09-25 LAB — RENAL FUNCTION PANEL
Albumin: 2.7 g/dL — ABNORMAL LOW (ref 3.5–5.0)
Albumin: 2.9 g/dL — ABNORMAL LOW (ref 3.5–5.0)
Anion gap: 6 (ref 5–15)
Anion gap: 10 (ref 5–15)
BUN: 16 mg/dL (ref 8–23)
BUN: 16 mg/dL (ref 8–23)
CO2: 27 mmol/L (ref 22–32)
CO2: 23 mmol/L (ref 22–32)
Calcium: 8.8 mg/dL — ABNORMAL LOW (ref 8.9–10.3)
Calcium: 9.1 mg/dL (ref 8.9–10.3)
Chloride: 97 mmol/L — ABNORMAL LOW (ref 98–111)
Chloride: 102 mmol/L (ref 98–111)
Creatinine, Ser: 0.85 mg/dL (ref 0.44–1.00)
Creatinine, Ser: 0.76 mg/dL (ref 0.44–1.00)
GFR, Estimated: >60 mL/min (ref >60)
GFR, Estimated: >60 mL/min (ref >60)
Glucose, Bld: 94 mg/dL (ref 70–99)
Glucose, Bld: 86 mg/dL (ref 70–99)
Phosphorus: 3.6 mg/dL (ref 2.5–4.6)
Phosphorus: 3.9 mg/dL (ref 2.5–4.6)
Potassium: 4 mmol/L (ref 3.5–5.1)
Potassium: 4.3 mmol/L (ref 3.5–5.1)
Sodium: 130 mmol/L — ABNORMAL LOW (ref 135–145)
Sodium: 135 mmol/L (ref 135–145)

## 2022-09-25 LAB — OSMOLALITY, URINE: Osmolality, Ur: 154 mOsm/kg — ABNORMAL LOW (ref 300–900)

## 2022-09-25 LAB — CBC
HCT: 25.6 % — ABNORMAL LOW (ref 36.0–46.0)
Hemoglobin: 8.7 g/dL — ABNORMAL LOW (ref 12.0–15.0)
MCH: 29.4 pg (ref 26.0–34.0)
MCHC: 34 g/dL (ref 30.0–36.0)
MCV: 86.5 fL (ref 80.0–100.0)
Platelets: 204 10*3/uL (ref 150–400)
RBC: 2.96 MIL/uL — ABNORMAL LOW (ref 3.87–5.11)
RDW: 13 % (ref 11.5–15.5)
WBC: 4.4 10*3/uL (ref 4.0–10.5)
nRBC: 0 % (ref 0.0–0.2)

## 2022-09-25 LAB — SODIUM: Sodium: 134 mmol/L — ABNORMAL LOW (ref 135–145)

## 2022-09-25 MED ORDER — TELMISARTAN 80 MG PO TABS: 80.0000 mg | ORAL_TABLET | Freq: Every day | ORAL | 2 refills | Status: DC

## 2022-09-25 MED ORDER — POTASSIUM CHLORIDE ER 10 MEQ PO TBCR: 10.0000 meq | EXTENDED_RELEASE_TABLET | Freq: Every day | ORAL | 0 refills | Status: DC

## 2022-09-25 MED ORDER — FUROSEMIDE 40 MG PO TABS: 40.0000 mg | ORAL_TABLET | Freq: Every day | ORAL | 0 refills | Status: DC

## 2022-09-25 NOTE — Progress Notes (Signed)
  Transition of Care [facility]) Screening Note   Patient Details  Name: Jenny Martin Date of Birth: 1946-01-11   Transition of Care [facility]) CM/SW Contact:    Otelia Santee, LCSW Phone Number: 09/25/2022, 11:22 AM    Transition of Care Department [facility]) has reviewed patient and no TOC needs have been identified at this time. We will continue to monitor patient advancement through interdisciplinary progression rounds. If new patient transition needs arise, please place a TOC consult.

## 2022-09-25 NOTE — Plan of Care (Signed)
  Problem: Education: Goal: Knowledge of General Education information will improve Description: Including pain rating scale, medication(s)/side effects and non-pharmacologic comfort measures Outcome: Adequate for Discharge   Problem: Health Behavior/Discharge Planning: Goal: Ability to manage health-related needs will improve Outcome: Adequate for Discharge   Problem: Clinical Measurements: Goal: Ability to maintain clinical measurements within normal limits will improve Outcome: Adequate for Discharge Goal: Will remain free from infection Outcome: Adequate for Discharge Goal: Diagnostic test results will improve Outcome: Adequate for Discharge Goal: Respiratory complications will improve Outcome: Adequate for Discharge Goal: Cardiovascular complication will be avoided Outcome: Adequate for Discharge   Problem: Activity: Goal: Risk for activity intolerance will decrease Outcome: Adequate for Discharge   Problem: Nutrition: Goal: Adequate nutrition will be maintained Outcome: Adequate for Discharge   Problem: Coping: Goal: Level of anxiety will decrease Outcome: Adequate for Discharge   Problem: Elimination: Goal: Will not experience complications related to bowel motility Outcome: Adequate for Discharge Goal: Will not experience complications related to urinary retention Outcome: Adequate for Discharge   Problem: Elimination: Goal: Will not experience complications related to bowel motility Outcome: Adequate for Discharge Goal: Will not experience complications related to urinary retention Outcome: Adequate for Discharge   Problem: Pain Managment: Goal: General experience of comfort will improve Outcome: Adequate for Discharge   Problem: Safety: Goal: Ability to remain free from injury will improve Outcome: Adequate for Discharge   Problem: Skin Integrity: Goal: Risk for impaired skin integrity will decrease Outcome: Adequate for Discharge   

## 2022-09-25 NOTE — Discharge Summary (Signed)
Physician Discharge Summary  Jenny Martin BOF:[id number] DOB: 1946-04-28 DOA: 09/24/2022  PCP: Lester Altona., MD  Admit date: 09/24/2022 Discharge date: 09/25/2022  Admitted From: Home Disposition: Home  Recommendations for Outpatient Follow-up:  Follow up with PCP in 1-2 weeks Please obtain BMP/CBC in one week There are changes on your medication regimen  Home Health: N/A Equipment/Devices: N/A  Discharge Condition: Stable CODE STATUS: Full code Diet recommendation: Regular diet  Discharge summary: 76 year old with history of hypertension, hyperlipidemia, morbid obesity, history of recurrent hyponatremia and currently on telmisartan hydrochlorothiazide presented to the emergency room with 1 episode of shortness of breath that has already resolved on arrival to the emergency room however she was found to have sodium of 121 with recent known sodium of 130.  She does have a history of severe hyponatremia due to compulsive water drinking and water intoxication.  Recently restarted on hydrochlorothiazide for leg swelling.  Patient was fairly asymptomatic and was admitted to the hospital due to severe hyponatremia.  Dilutional hyponatremia.  Multifactorial.  Hydrochlorothiazide, a compulsive water drinking.  She received isotonic fluid overnight with appropriate response.  Sodium 121-124-128-130.  Currently asymptomatic. Plan -Discontinue all hydrochlorothiazide containing medications, telmisartan hydrochlorothiazide will be discontinued and she will only take telmisartan. -For her leg swelling and chronic edema she will do compression stockings, elevation, she will use Lasix 40 mg daily. -She will also use potassium chloride 10 mill equivalent daily along with Lasix and telmisartan. -Recheck sodium levels in 1 week at primary care physician's office to ensure stabilization. -She was instructed to restrict free water to less than 2 liters in one day.   Other chronic medical issues  remained stable.   Discharge Diagnoses:  Principal Problem:   Hyponatremia Active Problems:   Hypercholesterolemia   Morbid obesity with BMI of 50.0-59.9, adult (HCC)   HTN (hypertension), benign   Normocytic anemia    Discharge Instructions  Discharge Instructions     Diet general   Complete by: As directed    Increase activity slowly   Complete by: As directed       Allergies as of 09/25/2022       Reactions   Atorvastatin Other (See Comments)   Myalgias   Losartan Other (See Comments)   Headaches   Micardis Hct [telmisartan-hctz] Swelling, Other (See Comments)   Ankles and legs swell   Pravastatin Other (See Comments)   Myalgias   Statins Other (See Comments)   Cramping and muscle pain   Valsartan-hydrochlorothiazide Other (See Comments)   Made the chest burn   Ibuprofen Hives, Rash        Medication List     STOP taking these medications    telmisartan-hydrochlorothiazide 80-25 MG tablet Commonly known as: MICARDIS HCT       TAKE these medications    albuterol 108 (90 Base) MCG/ACT inhaler Commonly known as: VENTOLIN HFA Inhale 2 puffs into the lungs every 6 (six) hours as needed for shortness of breath or wheezing.   Bayer Aspirin 325 MG tablet Generic drug: aspirin Take 325 mg by mouth See admin instructions. Take 325 mg by mouth at bedtime three to four nights a week   Centrum Silver 50+Women Tabs Take 1 tablet by mouth daily with breakfast.   CITRACAL/VITAMIN D PO Take 1 tablet by mouth every other day.   Dulcolax 400 MG/5ML suspension Generic drug: magnesium hydroxide Take 30 mLs by mouth daily as needed for mild constipation.   ezetimibe 10 MG tablet Commonly known as: ZETIA  Take 10 mg by mouth daily.   ferrous sulfate 325 (65 FE) MG tablet Take 325 mg by mouth every other day.   fluticasone 50 MCG/ACT nasal spray Commonly known as: FLONASE Place 1 spray into both nostrils daily as needed for allergies.   furosemide 40  MG tablet Commonly known as: LASIX Take 1 tablet (40 mg total) by mouth daily.   magnesium oxide 400 MG tablet Commonly known as: MAG-OX Take 400-800 mg by mouth See admin instructions. Take 400 mg by mouth once a day as needed for deficient magnesium and 800 mg once a day as needed for constipation   potassium chloride 10 MEQ tablet Commonly known as: KLOR-CON Take 1 tablet (10 mEq total) by mouth daily.   telmisartan 80 MG tablet Commonly known as: MICARDIS Take 1 tablet (80 mg total) by mouth daily.   triamcinolone cream 0.1 % Commonly known as: KENALOG Apply 1 application  topically 2 (two) times daily as needed (for atopic dermatitis- affected areas).   Tylenol 8 Hour Arthritis Pain 650 MG CR tablet Generic drug: acetaminophen Take 650 mg by mouth every 6 (six) hours as needed for pain.        Allergies  Allergen Reactions   Atorvastatin Other (See Comments)    Myalgias   Losartan Other (See Comments)    Headaches    Micardis Hct [Telmisartan-Hctz] Swelling and Other (See Comments)    Ankles and legs swell   Pravastatin Other (See Comments)    Myalgias   Statins Other (See Comments)    Cramping and muscle pain   Valsartan-Hydrochlorothiazide Other (See Comments)    Made the chest burn   Ibuprofen Hives and Rash    Consultations: None   Procedures/Studies: DG Chest 2 View  Result Date: 09/24/2022 CLINICAL DATA:  Shortness of breath. EXAM: CHEST - 2 VIEW COMPARISON:  05/21/2022. FINDINGS: Heart is enlarged the mediastinal contour is within normal limits. There is chronic elevation of the left diaphragm with atelectasis at the left lung base. Stable scarring is noted in the lateral aspect of the left upper lobe. The right lung is clear. No effusion or pneumothorax. No acute osseous abnormality. IMPRESSION: No active cardiopulmonary disease. Electronically Signed   By: Thornell Sartorius M.D.   On: 09/24/2022 00:02   (Echo, Carotid, EGD, Colonoscopy, ERCP)     Subjective: Patient seen in the morning rounds.  Walking around in the hallway.  Denies any complaints.  Still has leg swelling but better than before.  Denies any chest pain or shortness of breath.  Multiple questions were answered.  Lab tests were explained.  Eager to go home.   Discharge Exam: Vitals:   09/25/22 0120 09/25/22 0507  BP: (!) 118/51 (!) 115/54  Pulse: 74 77  Resp: 16 16  Temp: 98.2 F (36.8 C) 98.3 F (36.8 C)  SpO2: 100% 100%   Vitals:   09/24/22 1704 09/24/22 2118 09/25/22 0120 09/25/22 0507  BP: (!) 188/76 (!) 132/51 (!) 118/51 (!) 115/54  Pulse: 67 93 74 77  Resp: 19 18 16 16   Temp: (!) 97.3 F (36.3 C) 98.1 F (36.7 C) 98.2 F (36.8 C) 98.3 F (36.8 C)  TempSrc: Oral Oral Oral Oral  SpO2: 97% 100% 100% 100%  Weight:      Height:        General: Pt is alert, awake, not in acute distress Cardiovascular: RRR, S1/S2 +, no rubs, no gallops Respiratory: CTA bilaterally, no wheezing, no rhonchi Abdominal: Soft, NT, ND,  bowel sounds + Extremities: 2+ dependent leg edema. no cyanosis    The results of significant diagnostics from this hospitalization (including imaging, microbiology, ancillary and laboratory) are listed below for reference.     Microbiology: No results found for this or any previous visit (from the past 240 hour(s)).   Labs: BNP (last 3 results) Recent Labs    05/21/22 2044 09/24/22 0206  BNP 35.9 60.9   Basic Metabolic Panel: Recent Labs  Lab 09/24/22 0206 09/24/22 0835 09/24/22 1744 09/24/22 1749 09/25/22 0020  NA 121* 124*  --  127* 130*  K 4.0 4.1  --  3.8 4.0  CL 87* 90*  --  93* 97*  CO2 26 28  --  27 27  GLUCOSE 100* 97  --  93 94  BUN 15 13  --  14 16  CREATININE 0.78 0.69  --  0.70 0.85  CALCIUM 8.9 8.9  --  9.2 8.8*  MG  --   --  1.8  --   --   PHOS  --   --   --   --  3.6   Liver Function Tests: Recent Labs  Lab 09/24/22 1749 09/25/22 0020  AST 37  --   ALT 26  --   ALKPHOS 45  --   BILITOT  0.6  --   PROT 7.7  --   ALBUMIN 3.2* 2.7*   No results for input(s): "LIPASE", "AMYLASE" in the last 168 hours. No results for input(s): "AMMONIA" in the last 168 hours. CBC: Recent Labs  Lab 09/24/22 0206 09/25/22 0020  WBC 5.0 4.4  NEUTROABS 3.7  --   HGB 9.8* 8.7*  HCT 29.4* 25.6*  MCV 86.5 86.5  PLT 234 204   Cardiac Enzymes: No results for input(s): "CKTOTAL", "CKMB", "CKMBINDEX", "TROPONINI" in the last 168 hours. BNP: Invalid input(s): "POCBNP" CBG: No results for input(s): "GLUCAP" in the last 168 hours. D-Dimer No results for input(s): "DDIMER" in the last 72 hours. Hgb A1c No results for input(s): "HGBA1C" in the last 72 hours. Lipid Profile No results for input(s): "CHOL", "HDL", "LDLCALC", "TRIG", "CHOLHDL", "LDLDIRECT" in the last 72 hours. Thyroid function studies No results for input(s): "TSH", "T4TOTAL", "T3FREE", "THYROIDAB" in the last 72 hours.  Invalid input(s): "FREET3" Anemia work up Recent Labs    09/24/22 1749  TIBC 353  IRON 44   Urinalysis    Component Value Date/Time   COLORURINE YELLOW 09/24/2022 0236   APPEARANCEUR CLEAR 09/24/2022 0236   LABSPEC 1.010 09/24/2022 0236   PHURINE 7.0 09/24/2022 0236   GLUCOSEU NEGATIVE 09/24/2022 0236   HGBUR MODERATE (A) 09/24/2022 0236   BILIRUBINUR NEGATIVE 09/24/2022 0236   KETONESUR NEGATIVE 09/24/2022 0236   PROTEINUR NEGATIVE 09/24/2022 0236   NITRITE NEGATIVE 09/24/2022 0236   LEUKOCYTESUR NEGATIVE 09/24/2022 0236   Sepsis Labs Recent Labs  Lab 09/24/22 0206 09/25/22 0020  WBC 5.0 4.4   Microbiology No results found for this or any previous visit (from the past 240 hour(s)).   Time coordinating discharge: 32 minutes  SIGNED:   Dorcas Carrow, MD  [facility] 09/25/2022, 9:58 AM

## 2022-09-25 NOTE — Progress Notes (Signed)
Discharge instructions, RX and follow up appts explained and provided to patient verbalized understanding. Patient left floor via wheelchair accompanied staff, no c/o pain or shortness of breath at d/c.  Makennah Omura, Kae Heller, RN

## 2022-09-30 ENCOUNTER — Observation Stay (HOSPITAL_BASED_OUTPATIENT_CLINIC_OR_DEPARTMENT_OTHER)
Admission: EM | Admit: 2022-09-30 | Discharge: 2022-10-01 | Disposition: A | Discharge status: Home / Self Care | Payer: Medicare Other | Attending: Internal Medicine | Admitting: Internal Medicine

## 2022-09-30 ENCOUNTER — Encounter (HOSPITAL_COMMUNITY): Payer: Self-pay

## 2022-09-30 ENCOUNTER — Other Ambulatory Visit: Payer: Self-pay

## 2022-09-30 ENCOUNTER — Encounter (HOSPITAL_BASED_OUTPATIENT_CLINIC_OR_DEPARTMENT_OTHER): Payer: Self-pay | Admitting: Emergency Medicine

## 2022-09-30 ENCOUNTER — Emergency Department (HOSPITAL_BASED_OUTPATIENT_CLINIC_OR_DEPARTMENT_OTHER): Payer: Medicare Other

## 2022-09-30 DIAGNOSIS — Z79899 Other long term (current) drug therapy: Secondary | ICD-10-CM | POA: Insufficient documentation

## 2022-09-30 DIAGNOSIS — I890 Lymphedema, not elsewhere classified: Secondary | ICD-10-CM | POA: Diagnosis not present

## 2022-09-30 DIAGNOSIS — E7800 Pure hypercholesterolemia, unspecified: Secondary | ICD-10-CM | POA: Diagnosis not present

## 2022-09-30 DIAGNOSIS — I10 Essential (primary) hypertension: Secondary | ICD-10-CM | POA: Diagnosis not present

## 2022-09-30 DIAGNOSIS — R42 Dizziness and giddiness: Secondary | ICD-10-CM | POA: Diagnosis present

## 2022-09-30 DIAGNOSIS — E871 Hypo-osmolality and hyponatremia: Secondary | ICD-10-CM | POA: Diagnosis not present

## 2022-09-30 LAB — CBC WITH DIFFERENTIAL/PLATELET
Abs Immature Granulocytes: 0.02 10*3/uL (ref 0.00–0.07)
Basophils Absolute: 0 10*3/uL (ref 0.0–0.1)
Basophils Relative: 1 %
Eosinophils Absolute: 0.2 10*3/uL (ref 0.0–0.5)
Eosinophils Relative: 6 %
HCT: 31.2 % — ABNORMAL LOW (ref 36.0–46.0)
Hemoglobin: 10.3 g/dL — ABNORMAL LOW (ref 12.0–15.0)
Immature Granulocytes: 1 %
Lymphocytes Relative: 16 %
Lymphs Abs: 0.7 10*3/uL (ref 0.7–4.0)
MCH: 28.9 pg (ref 26.0–34.0)
MCHC: 33 g/dL (ref 30.0–36.0)
MCV: 87.6 fL (ref 80.0–100.0)
Monocytes Absolute: 0.8 10*3/uL (ref 0.1–1.0)
Monocytes Relative: 19 %
Neutro Abs: 2.5 10*3/uL (ref 1.7–7.7)
Neutrophils Relative %: 57 %
Platelets: 262 10*3/uL (ref 150–400)
RBC: 3.56 MIL/uL — ABNORMAL LOW (ref 3.87–5.11)
RDW: 13.2 % (ref 11.5–15.5)
WBC: 4.2 10*3/uL (ref 4.0–10.5)
nRBC: 0 % (ref 0.0–0.2)

## 2022-09-30 LAB — COMPREHENSIVE METABOLIC PANEL
ALT: 23 U/L (ref 0–44)
AST: 34 U/L (ref 15–41)
Albumin: 3.4 g/dL — ABNORMAL LOW (ref 3.5–5.0)
Alkaline Phosphatase: 48 U/L (ref 38–126)
Anion gap: 9 (ref 5–15)
BUN: 24 mg/dL — ABNORMAL HIGH (ref 8–23)
CO2: 26 mmol/L (ref 22–32)
Calcium: 8.6 mg/dL — ABNORMAL LOW (ref 8.9–10.3)
Chloride: 89 mmol/L — ABNORMAL LOW (ref 98–111)
Creatinine, Ser: 0.94 mg/dL (ref 0.44–1.00)
GFR, Estimated: >60 mL/min (ref >60)
Glucose, Bld: 87 mg/dL (ref 70–99)
Potassium: 4.3 mmol/L (ref 3.5–5.1)
Sodium: 124 mmol/L — ABNORMAL LOW (ref 135–145)
Total Bilirubin: 0.4 mg/dL (ref 0.3–1.2)
Total Protein: 8.5 g/dL — ABNORMAL HIGH (ref 6.5–8.1)

## 2022-09-30 MED ORDER — ONDANSETRON HCL 4 MG/2ML IJ SOLN: 4.0000 mg | Freq: Four times a day (QID) | INTRAMUSCULAR | Status: DC | PRN

## 2022-09-30 MED ORDER — DOCUSATE SODIUM 100 MG PO CAPS
100.0000 mg | ORAL_CAPSULE | Freq: Two times a day (BID) | ORAL | Status: DC
  Administered 2022-09-30 – 2022-10-01 (×2): 100 mg via ORAL
  Filled 2022-09-30 (×4): qty 1

## 2022-09-30 MED ORDER — ALBUTEROL SULFATE (2.5 MG/3ML) 0.083% IN NEBU: 2.5000 mg | INHALATION_SOLUTION | Freq: Four times a day (QID) | RESPIRATORY_TRACT | Status: DC | PRN

## 2022-09-30 MED ORDER — SODIUM CHLORIDE 0.9 % IV SOLN: INTRAVENOUS | Status: DC

## 2022-09-30 MED ORDER — OLOPATADINE HCL 0.1 % OP SOLN
1.0000 [drp] | Freq: Two times a day (BID) | OPHTHALMIC | Status: DC
  Administered 2022-10-01: 1 [drp] via OPHTHALMIC
  Filled 2022-09-30: qty 5

## 2022-09-30 MED ORDER — IRBESARTAN 300 MG PO TABS
300.0000 mg | ORAL_TABLET | Freq: Every day | ORAL | Status: DC
  Administered 2022-09-30 – 2022-10-01 (×2): 300 mg via ORAL
  Filled 2022-09-30 (×3): qty 1

## 2022-09-30 MED ORDER — BISACODYL 5 MG PO TBEC: 5.0000 mg | DELAYED_RELEASE_TABLET | Freq: Every day | ORAL | Status: DC | PRN

## 2022-09-30 MED ORDER — ALBUTEROL SULFATE HFA 108 (90 BASE) MCG/ACT IN AERS: 2.0000 | INHALATION_SPRAY | Freq: Four times a day (QID) | RESPIRATORY_TRACT | Status: DC | PRN

## 2022-09-30 MED ORDER — ENOXAPARIN SODIUM 40 MG/0.4ML IJ SOSY
40.0000 mg | PREFILLED_SYRINGE | INTRAMUSCULAR | Status: DC
  Administered 2022-09-30: 40 mg via SUBCUTANEOUS
  Filled 2022-09-30: qty 0.4

## 2022-09-30 MED ORDER — MAGNESIUM OXIDE -MG SUPPLEMENT 400 (240 MG) MG PO TABS: 400.0000 mg | ORAL_TABLET | Freq: Every day | ORAL | Status: DC | PRN

## 2022-09-30 MED ORDER — POLYETHYLENE GLYCOL 3350 17 G PO PACK: 17.0000 g | PACK | Freq: Every day | ORAL | Status: DC | PRN

## 2022-09-30 MED ORDER — HYDROCERIN EX CREA
TOPICAL_CREAM | CUTANEOUS | Status: DC
  Filled 2022-09-30: qty 113

## 2022-09-30 MED ORDER — MAGNESIUM OXIDE 400 MG PO TABS: 400.0000 mg | ORAL_TABLET | ORAL | Status: DC

## 2022-09-30 MED ORDER — MAGNESIUM HYDROXIDE 400 MG/5ML PO SUSP
30.0000 mL | Freq: Every day | ORAL | Status: DC | PRN
  Filled 2022-09-30: qty 30

## 2022-09-30 MED ORDER — LACTATED RINGERS IV SOLN: INTRAVENOUS | Status: DC

## 2022-09-30 MED ORDER — ACETAMINOPHEN 650 MG RE SUPP: 650.0000 mg | Freq: Four times a day (QID) | RECTAL | Status: DC | PRN

## 2022-09-30 MED ORDER — ACETAMINOPHEN 325 MG PO TABS: 650.0000 mg | ORAL_TABLET | Freq: Four times a day (QID) | ORAL | Status: DC | PRN

## 2022-09-30 MED ORDER — EZETIMIBE 10 MG PO TABS
10.0000 mg | ORAL_TABLET | Freq: Every day | ORAL | Status: DC
  Administered 2022-09-30 – 2022-10-01 (×2): 10 mg via ORAL
  Filled 2022-09-30 (×2): qty 1

## 2022-09-30 MED ORDER — HYDRALAZINE HCL 20 MG/ML IJ SOLN: 5.0000 mg | INTRAMUSCULAR | Status: DC | PRN

## 2022-09-30 MED ORDER — ONDANSETRON HCL 4 MG PO TABS
4.0000 mg | ORAL_TABLET | Freq: Four times a day (QID) | ORAL | Status: DC | PRN
  Filled 2022-09-30: qty 1

## 2022-09-30 NOTE — ED Notes (Signed)
Pt reports dizziness has improved slightly.

## 2022-09-30 NOTE — H&P (Signed)
History and Physical    Patient: Jenny Martin UXL:[id number] DOB: 1946/02/02 DOA: 09/30/2022 DOS: the patient was seen and examined on 09/30/2022 PCP: Lester [place]., MD  Patient coming from: Home - lives alone; NOK: [place], [place], [phone]   Chief Complaint: Dizziness  HPI: Jenny Martin is a 76 y.o. female with medical history significant of HLD, HTN, and morbid obesity presenting with dizziness.  She reports that she had water intoxication in Feb 2022.  Her Na++ went to 121.  She has been going back and forth with PCP.  It went back up and then it started going down again.  She drinks water and has been careful about it, maybe didn't drink enough.  She started feeling the same way this year and was hospitalized 11/10-11, went back up to 130 and she wasn't there long.  She went to her PCP yesterday and it had dropped 2 points.  She started drinking gatorade and water and it is low again. She cut back on eating salt.  They stopped HCTZ and put her on Lasix.  She has been running to the bathroom a lot.  She is on Lasix for LE edema.  She hasn't found compression stockings that will fit her.  She feels like something is coming on, maybe anxiety.  In spells, she feels SOB, anxious, thirsty, like she will pass out.  She is afraid to drink water because of the water intoxication.      ER Course:  [facility] to [facility] per Dr. Arville Care:  This patient is a 76 year old female with past medical history of hypertension, hld, prior UTIs, obesity, and recent admission for hyponatremia.  Patient presenting today with complaints of dizziness and generalized weakness.  This woke her from sleep just prior to arrival.  She was admitted last week for a sodium level of 121.  This was corrected with saline and patient was successfully discharged.  She returns today feeling the same way she felt when she was admitted before.  She denies any chest pain or palpitations.  She denies any fevers or chills.   When she came  to the ER, BP was 167/70 with otherwise normal vital signs.  Labs revealed sodium of 124 compared to 135 on 11/11 and chloride of 89 compared to 102 then with a BUN of 24 albumin 3.4, total protein of 8.5 and otherwise normal CMP.  CBC showed anemia better than previous levels.  UA is currently pending.     Review of Systems: As mentioned in the history of present illness. All other systems reviewed and are negative. Past Medical History:  Diagnosis Date   Abnormal Pap smear 08/29/2008   Anemia    Arachnoid cyst    Atopic dermatitis    Blood transfusion without reported diagnosis    Chronic vulvitis    H/O bladder infections    History of chicken pox    Hypercholesterolemia    Hypertension    Obese    Pneumonia, pneumococcal (HCC)    S/P thoracotomy    Past Surgical History:  Procedure Laterality Date   COLPOSCOPY  12/2008   normal   WRIST SURGERY     Social History:  reports that she has never smoked. She has never used smokeless tobacco. She reports that she does not drink alcohol and does not use drugs.  Allergies  Allergen Reactions   Atorvastatin Other (See Comments)    Myalgias   Losartan Other (See Comments)    Headaches    Micardis  Hct [Telmisartan-Hctz] Swelling and Other (See Comments)    Ankles and legs swell   Pravastatin Other (See Comments)    Myalgias   Statins Other (See Comments)    Cramping and muscle pain   Telmisartan Hives   Valsartan-Hydrochlorothiazide Other (See Comments)    Made the chest burn   Ibuprofen Hives and Rash    Family History  Problem Relation Age of Onset   COPD Mother    Stroke Father     Prior to Admission medications   Medication Sig Start Date End Date Taking? Authorizing Provider  albuterol (VENTOLIN HFA) 108 (90 Base) MCG/ACT inhaler Inhale 2 puffs into the lungs every 6 (six) hours as needed for shortness of breath or wheezing. 08/12/21   [provider]  BAYER ASPIRIN 325 MG tablet Take 325 mg by mouth See  admin instructions. Take 325 mg by mouth at bedtime three to four nights a week    [provider]  Calcium Citrate-Vitamin D (CITRACAL/VITAMIN D PO) Take 1 tablet by mouth every other day.    [provider]  DULCOLAX 1200 MG/15ML suspension Take 30 mLs by mouth daily as needed for mild constipation.    [provider]  ezetimibe (ZETIA) 10 MG tablet Take 10 mg by mouth daily. 10/31/21   [provider]  ferrous sulfate 325 (65 FE) MG tablet Take 325 mg by mouth every other day.    [provider]  fluticasone (FLONASE) 50 MCG/ACT nasal spray Place 1 spray into both nostrils daily as needed for allergies.    [provider]  furosemide (LASIX) 40 MG tablet Take 1 tablet (40 mg total) by mouth daily. 09/25/22 10/25/22  Dorcas Carrow, MD  magnesium oxide (MAG-OX) 400 MG tablet Take 400-800 mg by mouth See admin instructions. Take 400 mg by mouth once a day as needed for deficient magnesium and 800 mg once a day as needed for constipation 12/03/21   [provider]  Multiple Vitamins-Minerals (CENTRUM SILVER 50+WOMEN) TABS Take 1 tablet by mouth daily with breakfast.    [provider]  potassium chloride (KLOR-CON) 10 MEQ tablet Take 1 tablet (10 mEq total) by mouth daily. 09/25/22 10/25/22  Dorcas Carrow, MD  telmisartan (MICARDIS) 80 MG tablet Take 1 tablet (80 mg total) by mouth daily. 09/25/22 12/24/22  Dorcas Carrow, MD  triamcinolone cream (KENALOG) 0.1 % Apply 1 application  topically 2 (two) times daily as needed (for atopic dermatitis- affected areas).    [provider]  TYLENOL 8 HOUR ARTHRITIS PAIN 650 MG CR tablet Take 650 mg by mouth every 6 (six) hours as needed for pain.    [provider]    Physical Exam: Vitals:   09/30/22 0700 09/30/22 0724 09/30/22 0745 09/30/22 0924  BP:  111/67 (!) 140/70 (!) 159/76  Pulse: 69 66 73 68  Resp:  15 16 18   Temp: 98.1 F (36.7 C)  98.1 F (36.7 C) (!) 97  F (36.1 C)  TempSrc:    Oral  SpO2: 91% 100% 100% 100%  Weight:      Height:        Physical exam was initially performed in person at [facility] and then she was seen again upon arrival at [facility]  General:  Appears calm and comfortable and is in NAD Eyes:  PERRL, EOMI, normal lids, iris ENT:  grossly normal hearing, lips & tongue, mmm Neck:  no LAD, masses or thyromegaly Cardiovascular:  RRR, no m/r/g.   Respiratory:  CTA bilaterally with no wheezes/rales/rhonchi.  Normal respiratory effort. Abdomen:  soft, NT, ND Skin:  marked B lymphedema with stasis dermatitis Musculoskeletal:  grossly normal tone BUE/BLE, good ROM, no bony abnormality Psychiatric:  grossly normal mood and affect, speech fluent and appropriate, AOx3 Neurologic:  CN 2-12 grossly intact, moves all extremities in coordinated fashion   Radiological Exams on Admission: Independently reviewed - see discussion in A/P where applicable  DG Chest Port 1 View  Result Date: 09/30/2022 CLINICAL DATA:  76 year old female with history of weakness, dizziness, nausea and hyponatremia. EXAM: PORTABLE CHEST 1 VIEW COMPARISON:  Chest x-ray 09/23/2022. FINDINGS: Lung volumes are low. Elevation of the left hemidiaphragm (unchanged). No acute consolidative airspace disease. No pleural effusions. No pneumothorax. No evidence of pulmonary edema. Several ill-defined nodular densities are noted projecting over the lungs bilaterally, new compared to the prior examination. Heart size is mildly enlarged. Upper mediastinal contours are within normal limits. IMPRESSION: 1. Several new ill-defined nodular densities are noted projecting over the lungs bilaterally. The rapid development of these compared to the prior examination favors an infectious or inflammatory etiology. Follow-up standing PA and lateral chest radiograph is recommended in the next 1-2 weeks, potentially after trial of antimicrobial therapy, to ensure resolution and exclude neoplasma.  Electronically Signed   By: Trudie Reedaniel  Entrikin M.D.   On: 09/30/2022 07:19    EKG: Independently reviewed.  NSR with rate 73; nonspecific ST changes with no evidence of acute ischemia   Labs on Admission: I have personally reviewed the available labs and imaging studies at the time of the admission.  Pertinent labs:    Na++ 124, 135 on 11/11 Unremarkable CBC  Assessment and Plan: Principal Problem:   Hyponatremia Active Problems:   Hypercholesterolemia   Morbid obesity with BMI of 45.0-49.9, adult (HCC)   HTN (hypertension), benign    Hyponatremia -She has had recurrent issues with hyponatremia -Her first hospitalization was in 12/2021 with water intoxication after taking an oral bowel prep at home -She has had residual mild hyponatremia but again because symptomatic last week -She was hospitalized from 11/10-11 and this was thought to be dilutional and multifactorial in nature from HCTZ and compulsive water drinking; she improved with isotonic fluid overnight and HCTZ was stopped -She became symptomatic again and this time again appears to be multifactorial -Suspect Lasix + compulsive water drinking are an ongoing issue for her -She is taking Lasix for lymphedema only and she likely does not have to have any diuretics -She also needs very specific instruction about fluid intake; nutrition consult requested -Finally, she was previously told to stop eating any salt and she is likely to benefit from resumption of salt in her diet -Will observe overnight on med surg -With her current sodium down from 134 to 124, it is reasonable to just recheck in the AM -Will continue IVF overnight  Lymphedema -She reports that she has been unable to find compression stockings big enough to fit her legs -Compression is likely to be her best option -Will request wound care consultation to see if they have suggestions about this - maybe Unna boots?  HTN -Continue telmisartan (irbesartan per  formulary)  HLD -Continue Zetia  Obesity -Body mass index is 48.92 kg/m..  -Weight loss should be encouraged -Outpatient PCP/bariatric medicine f/u encouraged    Advance Care Planning:   Code Status: Full Code   Consults: Wound care; nutrition  DVT Prophylaxis: Lovenox  Family Communication: Son was present for part of evaluation  Severity of  Illness: The appropriate patient status for this patient is OBSERVATION. Observation status is judged to be reasonable and necessary in order to provide the required intensity of service to ensure the patient's safety. The patient's presenting symptoms, physical exam findings, and initial radiographic and laboratory data in the context of their medical condition is felt to place them at decreased risk for further clinical deterioration. Furthermore, it is anticipated that the patient will be medically stable for discharge from the hospital within 2 midnights of admission.   Author: Jonah Blue, MD 09/30/2022 11:06 AM  For on call review [email or web address].

## 2022-09-30 NOTE — ED Notes (Signed)
Report called to [employer] at [facility]. Pt made aware of plan of care. Consent to transfer signed by pt.  Pt stable for transport, waiting for [facility]

## 2022-09-30 NOTE — Progress Notes (Signed)
Plan of Care Note for accepted transfer   Patient: Jenny Martin MRN: [id number]   DOA: 09/30/2022  Facility requesting transfer: [facility] Requesting Provider: Geoffery Lyons, MD  Reason for transfer: Observation for recurrent hyponatremia Facility course: This patient is a 76 year old female with past medical history of hypertension, hld, prior UTIs, obesity, and recent admission for hyponatremia.  Patient presenting today with complaints of dizziness and generalized weakness.  This woke her from sleep just prior to arrival.  She was admitted last week for a sodium level of 121.  This was corrected with saline and patient was successfully discharged.  She returns today feeling the same way she felt when she was admitted before.  She denies any chest pain or palpitations.  She denies any fevers or chills.  When she came to the ER, BP was 167/70 with otherwise normal vital signs.  Labs revealed sodium of 124 compared to 135 on 11/11 and chloride of 89 compared to 102 then with a BUN of 24 albumin 3.4, total protein of 8.5 and otherwise normal CMP.  CBC showed anemia better than previous levels.  UA is currently pending.    Plan of care: The patient is accepted for admission to Med-surg  unit, at [facility]..  Will need hyponatremia work-up.  She will be under the care and responsibility of the ER physician till she arrives at [facility].  Author: Hannah Beat, MD 09/30/2022  Check [email or web address] for on-call coverage.  Nursing staff, Please call [facility] Admits & Consults System-Wide number on Amion as soon as patient's arrival, so appropriate admitting provider can evaluate the pt.

## 2022-09-30 NOTE — ED Provider Notes (Signed)
[facility] EMERGENCY DEPARTMENT Provider Note   CSN: [id number] Arrival date & time: 09/30/22  0445     History  Chief Complaint  Patient presents with   Dizziness    Dru Laurel is a 76 y.o. female.  This patient is a 76 year old female with past medical history of hypertension, prior UTIs, obesity, and recent admission for hyponatremia.  Patient presenting today with complaints of dizziness.  This woke her from sleep just prior to arrival.  She was admitted last week for a sodium level of 121.  This was corrected with saline and patient was successfully discharged.  She returns today feeling the same way she felt when she was admitted before.  She denies any chest pain or palpitations.  She denies any fevers or chills.  The history is provided by the patient.       Home Medications Prior to Admission medications   Medication Sig Start Date End Date Taking? Authorizing Provider  albuterol (VENTOLIN HFA) 108 (90 Base) MCG/ACT inhaler Inhale 2 puffs into the lungs every 6 (six) hours as needed for shortness of breath or wheezing. 08/12/21   [provider]  BAYER ASPIRIN 325 MG tablet Take 325 mg by mouth See admin instructions. Take 325 mg by mouth at bedtime three to four nights a week    [provider]  Calcium Citrate-Vitamin D (CITRACAL/VITAMIN D PO) Take 1 tablet by mouth every other day.    [provider]  DULCOLAX 1200 MG/15ML suspension Take 30 mLs by mouth daily as needed for mild constipation.    [provider]  ezetimibe (ZETIA) 10 MG tablet Take 10 mg by mouth daily. 10/31/21   [provider]  ferrous sulfate 325 (65 FE) MG tablet Take 325 mg by mouth every other day.    [provider]  fluticasone (FLONASE) 50 MCG/ACT nasal spray Place 1 spray into both nostrils daily as needed for allergies.    [provider]  furosemide (LASIX) 40 MG tablet Take 1 tablet (40 mg total) by mouth daily.  09/25/22 10/25/22  Dorcas Carrow, MD  magnesium oxide (MAG-OX) 400 MG tablet Take 400-800 mg by mouth See admin instructions. Take 400 mg by mouth once a day as needed for deficient magnesium and 800 mg once a day as needed for constipation 12/03/21   [provider]  Multiple Vitamins-Minerals (CENTRUM SILVER 50+WOMEN) TABS Take 1 tablet by mouth daily with breakfast.    [provider]  potassium chloride (KLOR-CON) 10 MEQ tablet Take 1 tablet (10 mEq total) by mouth daily. 09/25/22 10/25/22  Dorcas Carrow, MD  telmisartan (MICARDIS) 80 MG tablet Take 1 tablet (80 mg total) by mouth daily. 09/25/22 12/24/22  Dorcas Carrow, MD  triamcinolone cream (KENALOG) 0.1 % Apply 1 application  topically 2 (two) times daily as needed (for atopic dermatitis- affected areas).    [provider]  TYLENOL 8 HOUR ARTHRITIS PAIN 650 MG CR tablet Take 650 mg by mouth every 6 (six) hours as needed for pain.    [provider]      Allergies    Atorvastatin, Losartan, Micardis hct [telmisartan-hctz], Pravastatin, Statins, Valsartan-hydrochlorothiazide, and Ibuprofen    Review of Systems   Review of Systems  All other systems reviewed and are negative.   Physical Exam Updated Vital Signs BP (!) 167/70 (BP Location: Left Arm)   Pulse 71   Temp 98.1 F (36.7 C) (Oral)   Resp 16   Ht 5\' 5"  (1.651  m)   Wt 133.4 kg   SpO2 100%   BMI 48.92 kg/m  Physical Exam Vitals and nursing note reviewed.  Constitutional:      General: She is not in acute distress.    Appearance: She is well-developed. She is not diaphoretic.  HENT:     Head: Normocephalic and atraumatic.  Cardiovascular:     Rate and Rhythm: Normal rate and regular rhythm.     Heart sounds: No murmur heard.    No friction rub. No gallop.  Pulmonary:     Effort: Pulmonary effort is normal. No respiratory distress.     Breath sounds: Normal breath sounds. No wheezing.  Abdominal:     General: Bowel sounds are  normal. There is no distension.     Palpations: Abdomen is soft.     Tenderness: There is no abdominal tenderness.  Musculoskeletal:        General: Normal range of motion.     Cervical back: Normal range of motion and neck supple.     Right lower leg: Edema present.     Left lower leg: Edema present.  Skin:    General: Skin is warm and dry.  Neurological:     General: No focal deficit present.     Mental Status: She is alert and oriented to person, place, and time.     ED Results / Procedures / Treatments   Labs (all labs ordered are listed, but only abnormal results are displayed) Labs Reviewed  COMPREHENSIVE METABOLIC PANEL  CBC WITH DIFFERENTIAL/PLATELET    EKG None  Radiology No results found.  Procedures Procedures    Medications Ordered in ED Medications - No data to display  ED Course/ Medical Decision Making/ A&P  Patient is a 76 year old female presenting with complaints of dizziness.  She was recently admitted for hyponatremia with similar symptoms and believes this may be happening again.  On exam, patient arrives with stable vital signs and is afebrile.  There are no concerning findings on physical exam.  Work-up initiated including CBC and basic metabolic panel.  Patient found to have sodium of 124 and I feel will require admission for correction.  I have spoken with the hospitalist who agrees to admit.  Final Clinical Impression(s) / ED Diagnoses Final diagnoses:  None    Rx / DC Orders ED Discharge Orders     None         Geoffery Lyons, MD 09/30/22 [phone]

## 2022-09-30 NOTE — ED Notes (Signed)
ED Provider at bedside. 

## 2022-09-30 NOTE — Consult Note (Signed)
WOC Nurse Consult Note: Reason for Consult:bilateral stasis dermatitis Wound type:venous insufficiency Pressure Injury POA:N/A Measurement:N/A Wound bed:N/A Drainage (amount, consistency, odor) N/A Periwound:erythema, edema Dressing procedure/placement/frequency: I have provided Nursing with care guidance for every other day using a soap and water cleanse followed by application of Eucerin cream to the legs and feet (do not apply between toes). After moisturizing, the nurse is to apply a Kerlix roll gauze from just below toes to just below knees and secure with tape. This is to be topped with an ACE bandage and the heels floated.  I have discussed the POC with Dr. Ophelia Charter via Secure Chat.  Patient is in need of compression garments. She can present herself to a DME company [employer], forensic) for measuring and purchase of knee high garments or alternatively, her PCP can refer her to a lymphedema clinic and they can arrange sessions for manual decongestive therapy followed by referral for lymphedema pumps and garments.  WOC nursing team will not follow, but will remain available to this patient, the nursing and medical teams.  Please re-consult if needed.  Thank you for inviting [place] to participate in this patient's Plan of Care.  Ladona Mow, MSN, RN, CNS, GNP, Leda Min, [employer], [employer] phone:  [phone]

## 2022-09-30 NOTE — ED Notes (Signed)
[facility] into transport 

## 2022-09-30 NOTE — Progress Notes (Signed)
Mobility Specialist - Progress Note   09/30/22 1234  Mobility  Activity Ambulated with assistance in hallway  Level of Assistance Independent after set-up  Assistive Device None  Distance Ambulated (ft) 350 ft  Activity Response Tolerated well  Mobility Referral Yes  $Mobility charge 1 Mobility   Pt received in bed and agreed to mobility, no c/o pain nor discomfort during session. Ambulation cut short to return to sermon. Pt is back to bed with all needs met.   Roderick Pee Mobility Specialist

## 2022-09-30 NOTE — ED Triage Notes (Signed)
Pt states she is having dizziness and nauseated  Pt states she was here on the 9th and found she had low sodium level and was admitted to [facility]  Pt states she feels the same now as she did then

## 2022-10-01 DIAGNOSIS — E871 Hypo-osmolality and hyponatremia: Secondary | ICD-10-CM | POA: Diagnosis not present

## 2022-10-01 LAB — CBC
HCT: 30.4 % — ABNORMAL LOW (ref 36.0–46.0)
Hemoglobin: 10 g/dL — ABNORMAL LOW (ref 12.0–15.0)
MCH: 28.7 pg (ref 26.0–34.0)
MCHC: 32.9 g/dL (ref 30.0–36.0)
MCV: 87.4 fL (ref 80.0–100.0)
Platelets: 257 10*3/uL (ref 150–400)
RBC: 3.48 MIL/uL — ABNORMAL LOW (ref 3.87–5.11)
RDW: 13.4 % (ref 11.5–15.5)
WBC: 3.6 10*3/uL — ABNORMAL LOW (ref 4.0–10.5)
nRBC: 0 % (ref 0.0–0.2)

## 2022-10-01 LAB — BASIC METABOLIC PANEL
Anion gap: 8 (ref 5–15)
BUN: 22 mg/dL (ref 8–23)
CO2: 24 mmol/L (ref 22–32)
Calcium: 9.2 mg/dL (ref 8.9–10.3)
Chloride: 98 mmol/L (ref 98–111)
Creatinine, Ser: 0.81 mg/dL (ref 0.44–1.00)
GFR, Estimated: >60 mL/min (ref >60)
Glucose, Bld: 96 mg/dL (ref 70–99)
Potassium: 4.4 mmol/L (ref 3.5–5.1)
Sodium: 130 mmol/L — ABNORMAL LOW (ref 135–145)

## 2022-10-01 NOTE — Discharge Summary (Signed)
Physician Discharge Summary  Jenny Martin [id number] DOB: [birth year] DOA: 09/30/2022  PCP: Houston Siren., MD  Admit date: 09/30/2022 Discharge date: 10/01/2022  Admitted From: Home Disposition: Home  Recommendations for Outpatient Follow-up:  Follow up with PCP in 1-2 weeks Please obtain BMP in one week   Home Health: N/A Equipment/Devices: N/A  Discharge Condition: Stable CODE STATUS: Full code Diet recommendation: Regular diet  Discharge summary: 76 year old with history of hypertension hyperlipidemia and morbid obesity admitted with dizziness.  Patient does have significant history of hyponatremia, initially discovered with water intoxication and 2/22.  She has been having these problems on and off, she was recently admitted to the hospital for overnight monitoring with sodium 124 and improved to 130 with isotonic fluid.  She does have bilateral lower extremity edema.  On follow-up, her sodium was 128, yesterday she felt lightheaded and came to ER and sodium was 124.  Currently on Lasix, hydrochlorothiazide was discontinued.  Patient was monitored in the hospital.  She received 1 L of isotonic saline overnight and started on fluid restriction 1800 mL/day with excellent symptom control and sodium level improved to 130.  Currently asymptomatic.  Hyponatremia: Probably multifactorial.  she still has component of excess water intake. Sodium 130.  Hydrochlorothiazide was discontinued last week.  We will discontinue furosemide.  Strict fluid restriction less than 1800 mL in 24 hours.  Patient will liberalize her salt intake.  Recheck in 1 week. Compression stockings and elevation of leg for lymphedema.  Chronic medical issues including hypertension and hyperlipidemia remained stable.  Stable for discharge.   Discharge Diagnoses:  Principal Problem:   Hyponatremia Active Problems:   Hypercholesterolemia   Morbid obesity with BMI of 45.0-49.9, adult (HCC)   HTN  (hypertension), benign    Discharge Instructions  Discharge Instructions     Diet general   Complete by: As directed    Total fluid intake less than 1800 ml in 24 hours   Discharge wound care:   Complete by: As directed    Wound care to bilateral LEs every other day:  Wash legs with soap and water, rinse and dry. Apply Eucerin Cream to the legs but do not apply between toes.  Wrap LEs with Kerlix roll gauze, top with ACE bandage and secure with tape.Change every other day.   Increase activity slowly   Complete by: As directed       Allergies as of 10/01/2022       Reactions   Atorvastatin Other (See Comments)   Myalgias   Losartan Other (See Comments)   Headaches   Micardis Hct [telmisartan-hctz] Swelling, Other (See Comments)   Ankles and legs swell   Pravastatin Other (See Comments)   Myalgias   Statins Other (See Comments)   Cramping and muscle pain   Telmisartan Hives   Valsartan-hydrochlorothiazide Other (See Comments)   Made the chest burn   Ibuprofen Hives, Rash        Medication List     TAKE these medications    albuterol 108 (90 Base) MCG/ACT inhaler Commonly known as: VENTOLIN HFA Inhale 2 puffs into the lungs every 6 (six) hours as needed for shortness of breath or wheezing.   Bayer Aspirin 325 MG tablet Generic drug: aspirin Take 325 mg by mouth See admin instructions. Take 325 mg by mouth at bedtime three to four nights a week   Centrum Silver 50+Women Tabs Take 1 tablet by mouth daily with breakfast.   CITRACAL/VITAMIN D PO Take 1 tablet by  mouth every other day.   Dulcolax 400 MG/5ML suspension Generic drug: magnesium hydroxide Take 30 mLs by mouth daily as needed for mild constipation.   ezetimibe 10 MG tablet Commonly known as: ZETIA Take 10 mg by mouth daily.   ferrous sulfate 325 (65 FE) MG tablet Take 325 mg by mouth every other day.   fluticasone 50 MCG/ACT nasal spray Commonly known as: FLONASE Place 1 spray into both  nostrils daily as needed for allergies.   magnesium oxide 400 MG tablet Commonly known as: MAG-OX Take 400-800 mg by mouth See admin instructions. Take 400 mg by mouth once a day as needed for deficient magnesium and 800 mg once a day as needed for constipation   Olopatadine HCl 0.2 % Soln Place 1 drop into both eyes daily as needed (dry eyes and allergies).   potassium chloride 10 MEQ tablet Commonly known as: KLOR-CON Take 1 tablet (10 mEq total) by mouth daily.   telmisartan 80 MG tablet Commonly known as: MICARDIS Take 1 tablet (80 mg total) by mouth daily.   triamcinolone cream 0.1 % Commonly known as: KENALOG Apply 1 application  topically 2 (two) times daily as needed (for atopic dermatitis- affected areas).   Tylenol 8 Hour Arthritis Pain 650 MG CR tablet Generic drug: acetaminophen Take 650 mg by mouth every 6 (six) hours as needed for pain.               Discharge Care Instructions  (From admission, onward)           Start     Ordered   10/01/22 0000  Discharge wound care:       Comments: Wound care to bilateral LEs every other day:  Wash legs with soap and water, rinse and dry. Apply Eucerin Cream to the legs but do not apply between toes.  Wrap LEs with Kerlix roll gauze, top with ACE bandage and secure with tape.Change every other day.   10/01/22 1034            Allergies  Allergen Reactions   Atorvastatin Other (See Comments)    Myalgias   Losartan Other (See Comments)    Headaches    Micardis Hct [Telmisartan-Hctz] Swelling and Other (See Comments)    Ankles and legs swell   Pravastatin Other (See Comments)    Myalgias   Statins Other (See Comments)    Cramping and muscle pain   Telmisartan Hives   Valsartan-Hydrochlorothiazide Other (See Comments)    Made the chest burn   Ibuprofen Hives and Rash    Consultations: None   Procedures/Studies: DG Chest Port 1 View  Result Date: 09/30/2022 CLINICAL DATA:  76 year old female  with history of weakness, dizziness, nausea and hyponatremia. EXAM: PORTABLE CHEST 1 VIEW COMPARISON:  Chest x-ray 09/23/2022. FINDINGS: Lung volumes are low. Elevation of the left hemidiaphragm (unchanged). No acute consolidative airspace disease. No pleural effusions. No pneumothorax. No evidence of pulmonary edema. Several ill-defined nodular densities are noted projecting over the lungs bilaterally, new compared to the prior examination. Heart size is mildly enlarged. Upper mediastinal contours are within normal limits. IMPRESSION: 1. Several new ill-defined nodular densities are noted projecting over the lungs bilaterally. The rapid development of these compared to the prior examination favors an infectious or inflammatory etiology. Follow-up standing PA and lateral chest radiograph is recommended in the next 1-2 weeks, potentially after trial of antimicrobial therapy, to ensure resolution and exclude neoplasma. Electronically Signed   By: Mauri Brooklyn.D.  On: 09/30/2022 07:19   DG Chest 2 View  Result Date: 09/24/2022 CLINICAL DATA:  Shortness of breath. EXAM: CHEST - 2 VIEW COMPARISON:  05/21/2022. FINDINGS: Heart is enlarged the mediastinal contour is within normal limits. There is chronic elevation of the left diaphragm with atelectasis at the left lung base. Stable scarring is noted in the lateral aspect of the left upper lobe. The right lung is clear. No effusion or pneumothorax. No acute osseous abnormality. IMPRESSION: No active cardiopulmonary disease. Electronically Signed   By: Brett Fairy M.D.   On: 09/24/2022 00:02   (Echo, Carotid, EGD, Colonoscopy, ERCP)    Subjective: Patient seen and examined.  Denies any complaints.  She still drinks plenty of coffee and soda.  We discussed about reducing all carbonated fluid, total fluid restrictions and patient is agreeable.  Mobilized around.  No dizziness or lightheadedness.   Discharge Exam: Vitals:   09/30/22 2028 10/01/22 0443   BP: (!) 112/54 (!) 117/53  Pulse: 71 66  Resp: 18 18  Temp: 97.8 F (36.6 C) 98.4 F (36.9 C)  SpO2: 99% 97%   Vitals:   09/30/22 0924 09/30/22 1352 09/30/22 2028 10/01/22 0443  BP: (!) 159/76 (!) 126/55 (!) 112/54 (!) 117/53  Pulse: 68 85 71 66  Resp: 18 18 18 18   Temp: (!) 97.5 F (36.4 C) 98.6 F (37 C) 97.8 F (36.6 C) 98.4 F (36.9 C)  TempSrc: Oral Oral Oral Oral  SpO2: 100% 98% 99% 97%  Weight:      Height:        General: Pt is alert, awake, not in acute distress Cardiovascular: RRR, S1/S2 +, no rubs, no gallops Respiratory: CTA bilaterally, no wheezing, no rhonchi Abdominal: Soft, NT, ND, bowel sounds + Extremities: Chronic bilateral leg lymphedema, nonpitting.  No cyanosis.    The results of significant diagnostics from this hospitalization (including imaging, microbiology, ancillary and laboratory) are listed below for reference.     Microbiology: No results found for this or any previous visit (from the past 240 hour(s)).   Labs: BNP (last 3 results) Recent Labs    05/21/22 2044 09/24/22 0206  BNP 35.9 0000000   Basic Metabolic Panel: Recent Labs  Lab 09/24/22 1744 09/24/22 1749 09/25/22 0020 09/25/22 0607 09/30/22 0505 10/01/22 0335  NA  --  127* 130* 135  134* 124* 130*  K  --  3.8 4.0 4.3 4.3 4.4  CL  --  93* 97* 102 89* 98  CO2  --  27 27 23 26 24   GLUCOSE  --  93 94 86 87 96  BUN  --  14 16 16  24* 22  CREATININE  --  0.70 0.85 0.76 0.94 0.81  CALCIUM  --  9.2 8.8* 9.1 8.6* 9.2  MG 1.8  --   --   --   --   --   PHOS  --   --  3.6 3.9  --   --    Liver Function Tests: Recent Labs  Lab 09/24/22 1749 09/25/22 0020 09/25/22 0607 09/30/22 0505  AST 37  --   --  34  ALT 26  --   --  23  ALKPHOS 45  --   --  48  BILITOT 0.6  --   --  0.4  PROT 7.7  --   --  8.5*  ALBUMIN 3.2* 2.7* 2.9* 3.4*   No results for input(s): "LIPASE", "AMYLASE" in the last 168 hours. No results for input(s): "AMMONIA" in  the last 168  hours. CBC: Recent Labs  Lab 09/25/22 0020 09/30/22 0505 10/01/22 0335  WBC 4.4 4.2 3.6*  NEUTROABS  --  2.5  --   HGB 8.7* 10.3* 10.0*  HCT 25.6* 31.2* 30.4*  MCV 86.5 87.6 87.4  PLT 204 262 257   Cardiac Enzymes: No results for input(s): "CKTOTAL", "CKMB", "CKMBINDEX", "TROPONINI" in the last 168 hours. BNP: Invalid input(s): "POCBNP" CBG: No results for input(s): "GLUCAP" in the last 168 hours. D-Dimer No results for input(s): "DDIMER" in the last 72 hours. Hgb A1c No results for input(s): "HGBA1C" in the last 72 hours. Lipid Profile No results for input(s): "CHOL", "HDL", "LDLCALC", "TRIG", "CHOLHDL", "LDLDIRECT" in the last 72 hours. Thyroid function studies No results for input(s): "TSH", "T4TOTAL", "T3FREE", "THYROIDAB" in the last 72 hours.  Invalid input(s): "FREET3" Anemia work up No results for input(s): "VITAMINB12", "FOLATE", "FERRITIN", "TIBC", "IRON", "RETICCTPCT" in the last 72 hours. Urinalysis    Component Value Date/Time   COLORURINE YELLOW 09/24/2022 0236   APPEARANCEUR CLEAR 09/24/2022 0236   LABSPEC 1.010 09/24/2022 0236   PHURINE 7.0 09/24/2022 0236   GLUCOSEU NEGATIVE 09/24/2022 0236   HGBUR MODERATE (A) 09/24/2022 0236   BILIRUBINUR NEGATIVE 09/24/2022 0236   KETONESUR NEGATIVE 09/24/2022 0236   PROTEINUR NEGATIVE 09/24/2022 0236   NITRITE NEGATIVE 09/24/2022 0236   LEUKOCYTESUR NEGATIVE 09/24/2022 0236   Sepsis Labs Recent Labs  Lab 09/25/22 0020 09/30/22 0505 10/01/22 0335  WBC 4.4 4.2 3.6*   Microbiology No results found for this or any previous visit (from the past 240 hour(s)).   Time coordinating discharge: 28 minutes  SIGNED:   Dorcas Carrow, MD  [facility] 10/01/2022, 10:35 AM

## 2022-10-01 NOTE — Progress Notes (Signed)
Patient was discharged to home. AVS reviewed, no new medications. Patient will call her PCP and set up a follow-up appointment. Educated on fluid restriction, She confirmed she had all of her belongings. IV removed. NT assisted patient to exit, and her son provided transportation.

## 2022-11-29 ENCOUNTER — Emergency Department (HOSPITAL_BASED_OUTPATIENT_CLINIC_OR_DEPARTMENT_OTHER): Payer: Medicare Other

## 2022-11-29 ENCOUNTER — Emergency Department (HOSPITAL_BASED_OUTPATIENT_CLINIC_OR_DEPARTMENT_OTHER)
Admission: EM | Admit: 2022-11-29 | Discharge: 2022-11-29 | Disposition: A | Discharge status: Home / Self Care | Payer: Medicare Other | Attending: Emergency Medicine | Admitting: Emergency Medicine

## 2022-11-29 DIAGNOSIS — R600 Localized edema: Secondary | ICD-10-CM

## 2022-11-29 DIAGNOSIS — Z7982 Long term (current) use of aspirin: Secondary | ICD-10-CM | POA: Diagnosis not present

## 2022-11-29 DIAGNOSIS — R0602 Shortness of breath: Secondary | ICD-10-CM | POA: Diagnosis not present

## 2022-11-29 DIAGNOSIS — Z1152 Encounter for screening for COVID-19: Secondary | ICD-10-CM | POA: Insufficient documentation

## 2022-11-29 DIAGNOSIS — R609 Edema, unspecified: Secondary | ICD-10-CM | POA: Insufficient documentation

## 2022-11-29 DIAGNOSIS — Z79899 Other long term (current) drug therapy: Secondary | ICD-10-CM | POA: Insufficient documentation

## 2022-11-29 DIAGNOSIS — L509 Urticaria, unspecified: Secondary | ICD-10-CM | POA: Diagnosis present

## 2022-11-29 DIAGNOSIS — I10 Essential (primary) hypertension: Secondary | ICD-10-CM | POA: Insufficient documentation

## 2022-11-29 LAB — BASIC METABOLIC PANEL
Anion gap: 8 (ref 5–15)
BUN: 19 mg/dL (ref 8–23)
CO2: 27 mmol/L (ref 22–32)
Calcium: 8.9 mg/dL (ref 8.9–10.3)
Chloride: 99 mmol/L (ref 98–111)
Creatinine, Ser: 0.86 mg/dL (ref 0.44–1.00)
GFR, Estimated: >60 mL/min (ref >60)
Glucose, Bld: 104 mg/dL — ABNORMAL HIGH (ref 70–99)
Potassium: 4.2 mmol/L (ref 3.5–5.1)
Sodium: 134 mmol/L — ABNORMAL LOW (ref 135–145)

## 2022-11-29 LAB — CBC WITH DIFFERENTIAL/PLATELET
Abs Immature Granulocytes: 0.02 10*3/uL (ref 0.00–0.07)
Basophils Absolute: 0.1 10*3/uL (ref 0.0–0.1)
Basophils Relative: 1 %
Eosinophils Absolute: 0.3 10*3/uL (ref 0.0–0.5)
Eosinophils Relative: 7 %
HCT: 30.4 % — ABNORMAL LOW (ref 36.0–46.0)
Hemoglobin: 9.8 g/dL — ABNORMAL LOW (ref 12.0–15.0)
Immature Granulocytes: 1 %
Lymphocytes Relative: 13 %
Lymphs Abs: 0.5 10*3/uL — ABNORMAL LOW (ref 0.7–4.0)
MCH: 28.8 pg (ref 26.0–34.0)
MCHC: 32.2 g/dL (ref 30.0–36.0)
MCV: 89.4 fL (ref 80.0–100.0)
Monocytes Absolute: 0.6 10*3/uL (ref 0.1–1.0)
Monocytes Relative: 15 %
Neutro Abs: 2.5 10*3/uL (ref 1.7–7.7)
Neutrophils Relative %: 63 %
Platelets: 242 10*3/uL (ref 150–400)
RBC: 3.4 MIL/uL — ABNORMAL LOW (ref 3.87–5.11)
RDW: 14.4 % (ref 11.5–15.5)
WBC: 3.8 10*3/uL — ABNORMAL LOW (ref 4.0–10.5)
nRBC: 0 % (ref 0.0–0.2)

## 2022-11-29 LAB — RESP PANEL BY RT-PCR (RSV, FLU A&B, COVID)  RVPGX2
Influenza A by PCR: NEGATIVE
Influenza B by PCR: NEGATIVE
Resp Syncytial Virus by PCR: NEGATIVE
SARS Coronavirus 2 by RT PCR: NEGATIVE

## 2022-11-29 LAB — BRAIN NATRIURETIC PEPTIDE: B Natriuretic Peptide: 88.6 pg/mL (ref 0.0–100.0)

## 2022-11-29 NOTE — ED Provider Notes (Signed)
[facility] EMERGENCY DEPARTMENT Provider Note   CSN: [id number] Arrival date & time: 11/29/22  1111     History  Chief Complaint  Patient presents with   Urticaria    Jenny Martin is a 77 y.o. female.   Urticaria     Patient with medical history of hypertension, hypercholesterolemia, atopic dermatitis presents to the emergency department due to multiple complaints.  She states that over the last 5 to 6 years since being started on different blood pressure medicine she has had intermittent urticaria.  States she was started on telmisartan 6 7 days ago and she has been having worsening hives since then.  They usually improved with Benadryl, she took it today and the hives went away but she still felt "off".  States over the last few weeks to days she has been having worsening lower extremity swelling and pitting edema.  She was previously on Lasix and HCTZ but had to be taken off of them due to severe hyponatremia.  She denies any chest pain but does feel short of breath especially with exertion.  No orthopnea.  Denies any known history of heart failure.  Home Medications Prior to Admission medications   Medication Sig Start Date End Date Taking? Authorizing Provider  albuterol (VENTOLIN HFA) 108 (90 Base) MCG/ACT inhaler Inhale 2 puffs into the lungs every 6 (six) hours as needed for shortness of breath or wheezing. 08/12/21   [provider]  BAYER ASPIRIN 325 MG tablet Take 325 mg by mouth See admin instructions. Take 325 mg by mouth at bedtime three to four nights a week    [provider]  Calcium Citrate-Vitamin D (CITRACAL/VITAMIN D PO) Take 1 tablet by mouth every other day.    [provider]  DULCOLAX 1200 MG/15ML suspension Take 30 mLs by mouth daily as needed for mild constipation.    [provider]  ezetimibe (ZETIA) 10 MG tablet Take 10 mg by mouth daily. 10/31/21   [provider]  ferrous sulfate 325 (65 FE) MG  tablet Take 325 mg by mouth every other day.    [provider]  fluticasone (FLONASE) 50 MCG/ACT nasal spray Place 1 spray into both nostrils daily as needed for allergies.    [provider]  magnesium oxide (MAG-OX) 400 MG tablet Take 400-800 mg by mouth See admin instructions. Take 400 mg by mouth once a day as needed for deficient magnesium and 800 mg once a day as needed for constipation 12/03/21   [provider]  Multiple Vitamins-Minerals (CENTRUM SILVER 50+WOMEN) TABS Take 1 tablet by mouth daily with breakfast.    [provider]  Olopatadine HCl 0.2 % SOLN Place 1 drop into both eyes daily as needed (dry eyes and allergies).    [provider]  potassium chloride (KLOR-CON) 10 MEQ tablet Take 1 tablet (10 mEq total) by mouth daily. 09/25/22 10/25/22  Barb Merino, MD  telmisartan (MICARDIS) 80 MG tablet Take 1 tablet (80 mg total) by mouth daily. 09/25/22 12/24/22  Barb Merino, MD  triamcinolone cream (KENALOG) 0.1 % Apply 1 application  topically 2 (two) times daily as needed (for atopic dermatitis- affected areas).    [provider]  TYLENOL 8 HOUR ARTHRITIS PAIN 650 MG CR tablet Take 650 mg by mouth every 6 (six) hours as needed for pain.    [provider]      Allergies    Atorvastatin, Losartan, Micardis hct [telmisartan-hctz], Pravastatin, Statins, Telmisartan, Valsartan-hydrochlorothiazide, and Ibuprofen  Review of Systems   Review of Systems  Physical Exam Updated Vital Signs BP (!) 180/96   Pulse 89   Temp 97.7 F (36.5 C) (Oral)   Resp 17   Ht 5\' 5"  (1.651 m)   Wt 131.5 kg   SpO2 99%   BMI 48.26 kg/m  Physical Exam Vitals and nursing note reviewed. Exam conducted with a chaperone present.  Constitutional:      Appearance: Normal appearance.  HENT:     Head: Normocephalic and atraumatic.  Eyes:     General: No scleral icterus.       Right eye: No discharge.        Left eye: No discharge.      Extraocular Movements: Extraocular movements intact.     Pupils: Pupils are equal, round, and reactive to light.  Cardiovascular:     Rate and Rhythm: Normal rate and regular rhythm.     Pulses: Normal pulses.     Heart sounds: Murmur heard.     No friction rub. No gallop.  Pulmonary:     Effort: Pulmonary effort is normal. No respiratory distress.     Breath sounds: Normal breath sounds.  Abdominal:     General: Abdomen is flat. Bowel sounds are normal. There is no distension.     Palpations: Abdomen is soft.     Tenderness: There is no abdominal tenderness.  Musculoskeletal:     Right lower leg: Edema present.     Left lower leg: Edema present.     Comments: 3+ pitting edema to the lower extremity  Skin:    General: Skin is warm and dry.     Coloration: Skin is not jaundiced.     Comments: I do not appreciate any urticaria to the face, neck, lower extremities, upper extremities, back, chest wall  Neurological:     Mental Status: She is alert. Mental status is at baseline.     Coordination: Coordination normal.     ED Results / Procedures / Treatments   Labs (all labs ordered are listed, but only abnormal results are displayed) Labs Reviewed  RESP PANEL BY RT-PCR (RSV, FLU A&B, COVID)  [id number]  BASIC METABOLIC PANEL  CBC WITH DIFFERENTIAL/PLATELET  BRAIN NATRIURETIC PEPTIDE    EKG None  Radiology No results found.  Procedures Procedures    Medications Ordered in ED Medications - No data to display  ED Course/ Medical Decision Making/ A&P                             Medical Decision Making Amount and/or Complexity of Data Reviewed Labs: ordered. Radiology: ordered.   Patient presents to the emergency department due to hives and lower extremity swelling.  Differential includes but not limited to allergic reaction, anaphylaxis, viral exanthem, heart failure onset, metabolic abnormality, kidney failure.  No On exam patient is very well-appearing.  There is  no signs of anaphylaxis, she is protecting her airway and does not have any history of anaphylaxis.  She does have lower extremity edema and I do not appreciate any rales on exam.  She also not hypoxic.  Given the shortness of breath and lower extremity swelling we will proceed with laboratory workup including a BNP as well as a chest x-ray and EKG.  I ordered, viewed and interpreted laboratory workup. CBC is slightly Caprini, patient is anemic and 9.8 but not grossly changed compared to baseline. BMP without gross electro derangement or  AKI. COVID and flu is negative. BNP is within normal limits.  Chest x-ray with normal cardiac silhouette, no edema.  EKG shows sinus rhythm.  Given reassuring laboratory workup I do not suspect emergent etiology of the lower extremity edema.  I reviewed external medical records including patient's primary note from last week and she had edema at that time as well so this seems less likely to be acute.  We discussed that she will need to follow-up with her primary doctor regarding the blood pressure medicine she is currently on as I do not feel comfortable changing it.  She will take Benadryl for itching, we discussed return precautions including any indications of impending anaphylaxis or allergic reaction.  She is stable for discharge at this time.        Final Clinical Impression(s) / ED Diagnoses Final diagnoses:  None    Rx / DC Orders ED Discharge Orders     None         Theron Arista, PA-C 11/29/22 1641    Rolan Bucco, MD 11/30/22 [phone]

## 2022-11-29 NOTE — Discharge Instructions (Signed)
Take the Benadryl as prescribed by the bottle at the frequency you are allowed to take it.  Follow-up with your doctor first thing tomorrow morning.  Return to the ED if you have any difficulty swallowing, facial swelling, tickling in the back of your throat or new or concerning symptoms.

## 2022-11-29 NOTE — ED Triage Notes (Signed)
Patient presents to ED via POV from home. Here with medication reaction. Patient reports she started taking telmisartan x 6 months. Patient reports she is allergic. Reports hives x 5-6 years.

## 2022-12-02 ENCOUNTER — Other Ambulatory Visit: Payer: Self-pay

## 2022-12-02 ENCOUNTER — Encounter (HOSPITAL_BASED_OUTPATIENT_CLINIC_OR_DEPARTMENT_OTHER): Payer: Self-pay | Admitting: Emergency Medicine

## 2022-12-02 ENCOUNTER — Emergency Department (HOSPITAL_BASED_OUTPATIENT_CLINIC_OR_DEPARTMENT_OTHER): Payer: Medicare Other

## 2022-12-02 ENCOUNTER — Emergency Department (HOSPITAL_BASED_OUTPATIENT_CLINIC_OR_DEPARTMENT_OTHER)
Admission: EM | Admit: 2022-12-02 | Discharge: 2022-12-02 | Disposition: A | Discharge status: Home / Self Care | Payer: Medicare Other | Attending: Emergency Medicine | Admitting: Emergency Medicine

## 2022-12-02 DIAGNOSIS — I10 Essential (primary) hypertension: Secondary | ICD-10-CM | POA: Diagnosis not present

## 2022-12-02 DIAGNOSIS — R0789 Other chest pain: Secondary | ICD-10-CM | POA: Diagnosis present

## 2022-12-02 DIAGNOSIS — Z79899 Other long term (current) drug therapy: Secondary | ICD-10-CM | POA: Diagnosis not present

## 2022-12-02 DIAGNOSIS — R319 Hematuria, unspecified: Secondary | ICD-10-CM

## 2022-12-02 DIAGNOSIS — R0602 Shortness of breath: Secondary | ICD-10-CM | POA: Insufficient documentation

## 2022-12-02 DIAGNOSIS — R079 Chest pain, unspecified: Secondary | ICD-10-CM

## 2022-12-02 HISTORY — DX: Carpal tunnel syndrome, unspecified upper limb: G56.00

## 2022-12-02 LAB — URINALYSIS, ROUTINE W REFLEX MICROSCOPIC
Bilirubin Urine: NEGATIVE
Glucose, UA: NEGATIVE mg/dL
Ketones, ur: NEGATIVE mg/dL
Nitrite: NEGATIVE
Protein, ur: >=300 mg/dL — AB
Specific Gravity, Urine: 1.015 (ref 1.005–1.030)
pH: 6.5 (ref 5.0–8.0)

## 2022-12-02 LAB — BASIC METABOLIC PANEL
Anion gap: 9 (ref 5–15)
BUN: 24 mg/dL — ABNORMAL HIGH (ref 8–23)
CO2: 26 mmol/L (ref 22–32)
Calcium: 8.8 mg/dL — ABNORMAL LOW (ref 8.9–10.3)
Chloride: 99 mmol/L (ref 98–111)
Creatinine, Ser: 0.83 mg/dL (ref 0.44–1.00)
GFR, Estimated: >60 mL/min (ref >60)
Glucose, Bld: 95 mg/dL (ref 70–99)
Potassium: 4.3 mmol/L (ref 3.5–5.1)
Sodium: 134 mmol/L — ABNORMAL LOW (ref 135–145)

## 2022-12-02 LAB — CBC
HCT: 28.6 % — ABNORMAL LOW (ref 36.0–46.0)
Hemoglobin: 9.2 g/dL — ABNORMAL LOW (ref 12.0–15.0)
MCH: 28.7 pg (ref 26.0–34.0)
MCHC: 32.2 g/dL (ref 30.0–36.0)
MCV: 89.1 fL (ref 80.0–100.0)
Platelets: 233 10*3/uL (ref 150–400)
RBC: 3.21 MIL/uL — ABNORMAL LOW (ref 3.87–5.11)
RDW: 14.2 % (ref 11.5–15.5)
WBC: 4.2 10*3/uL (ref 4.0–10.5)
nRBC: 0 % (ref 0.0–0.2)

## 2022-12-02 LAB — URINALYSIS, MICROSCOPIC (REFLEX)

## 2022-12-02 LAB — BRAIN NATRIURETIC PEPTIDE: B Natriuretic Peptide: 113.5 pg/mL — ABNORMAL HIGH (ref 0.0–100.0)

## 2022-12-02 LAB — TROPONIN I (HIGH SENSITIVITY)
Troponin I (High Sensitivity): 6 ng/L (ref <18)
Troponin I (High Sensitivity): 7 ng/L (ref <18)

## 2022-12-02 MED ORDER — CARVEDILOL 6.25 MG PO TABS
6.2500 mg | ORAL_TABLET | Freq: Once | ORAL | Status: AC
  Administered 2022-12-02: 6.25 mg via ORAL
  Filled 2022-12-02: qty 1

## 2022-12-02 MED ORDER — ALBUTEROL SULFATE HFA 108 (90 BASE) MCG/ACT IN AERS
2.0000 | INHALATION_SPRAY | Freq: Once | RESPIRATORY_TRACT | Status: AC
  Administered 2022-12-02: 2 via RESPIRATORY_TRACT
  Filled 2022-12-02: qty 6.7

## 2022-12-02 MED ORDER — FUROSEMIDE 10 MG/ML IJ SOLN
10.0000 mg | Freq: Once | INTRAMUSCULAR | Status: AC
  Administered 2022-12-02: 10 mg via INTRAVENOUS
  Filled 2022-12-02: qty 2

## 2022-12-02 MED ORDER — FUROSEMIDE 20 MG PO TABS: 10.0000 mg | ORAL_TABLET | Freq: Two times a day (BID) | ORAL | 0 refills | Status: DC

## 2022-12-02 MED ORDER — HYDRALAZINE HCL 20 MG/ML IJ SOLN
10.0000 mg | Freq: Once | INTRAMUSCULAR | Status: AC
  Administered 2022-12-02: 10 mg via INTRAVENOUS
  Filled 2022-12-02: qty 1

## 2022-12-02 NOTE — ED Triage Notes (Signed)
Pt seen for HTN on 11/29/22.  Started new BP medication yesterday.  BP still reading high at home.  Pt started having chest tightness with sob prior to arrival.  Pt had new medication started due to allergies.  Pt having trouble finding a medication that works.

## 2022-12-02 NOTE — ED Provider Notes (Signed)
[facility] EMERGENCY DEPARTMENT Provider Note   CSN: [id number] Arrival date & time: 12/02/22  1207     History  Chief Complaint  Patient presents with   Hypertension   Chest Pain    Jenny Martin is a 77 y.o. female.  Patient is a 77 year old female with a past medical history of hypertension and atopic dermatitis presenting to the emergency department with chest pain and shortness of breath.  Patient states that she has had a lot of difficulty managing her blood pressure due to multiple allergies or adverse reactions to her blood pressure medications.  Patient states that she was recently started on Coreg yesterday and reports that she took her first dose yesterday evening.  She states that she woke up this morning with some chest tightness and shortness of breath and felt like her blood pressure was elevated.  She states that when she checked it at home it is between 952W systolic to 413 systolic which prompted her to come to the emergency department.  She states that she also feels like her legs are more swollen and tight.  She states that she has been unable to tolerate Lasix and hydrochlorothiazide in the past due to hyponatremia.  She states that since being taken off the Lasix she feels like she cannot pee normally and not as much urine comes out as previous.  She denies any dysuria or hematuria.  The history is provided by the patient.  Hypertension Associated symptoms include chest pain.  Chest Pain      Home Medications Prior to Admission medications   Medication Sig Start Date End Date Taking? Authorizing Provider  albuterol (VENTOLIN HFA) 108 (90 Base) MCG/ACT inhaler Inhale 2 puffs into the lungs every 6 (six) hours as needed for shortness of breath or wheezing. 08/12/21   [provider]  BAYER ASPIRIN 325 MG tablet Take 325 mg by mouth See admin instructions. Take 325 mg by mouth at bedtime three to four nights a week    [provider]   Calcium Citrate-Vitamin D (CITRACAL/VITAMIN D PO) Take 1 tablet by mouth every other day.    [provider]  carvedilol (COREG) 6.25 MG tablet Take 6.25 mg by mouth daily. 12/01/22   [provider]  DULCOLAX 1200 MG/15ML suspension Take 30 mLs by mouth daily as needed for mild constipation.    [provider]  ezetimibe (ZETIA) 10 MG tablet Take 10 mg by mouth daily. 10/31/21   [provider]  ferrous sulfate 325 (65 FE) MG tablet Take 325 mg by mouth every other day.    [provider]  fluticasone (FLONASE) 50 MCG/ACT nasal spray Place 1 spray into both nostrils daily as needed for allergies.    [provider]  magnesium oxide (MAG-OX) 400 MG tablet Take 400-800 mg by mouth See admin instructions. Take 400 mg by mouth once a day as needed for deficient magnesium and 800 mg once a day as needed for constipation 12/03/21   [provider]  Multiple Vitamins-Minerals (CENTRUM SILVER 50+WOMEN) TABS Take 1 tablet by mouth daily with breakfast.    [provider]  Olopatadine HCl 0.2 % SOLN Place 1 drop into both eyes daily as needed (dry eyes and allergies).    [provider]  potassium chloride (KLOR-CON) 10 MEQ tablet Take 1 tablet (10 mEq total) by mouth daily. 09/25/22 10/25/22  Barb Merino, MD  telmisartan (MICARDIS) 80 MG tablet Take 1 tablet (80 mg total) by mouth  daily. 09/25/22 12/24/22  Dorcas Carrow, MD  triamcinolone cream (KENALOG) 0.1 % Apply 1 application  topically 2 (two) times daily as needed (for atopic dermatitis- affected areas).    [provider]  TYLENOL 8 HOUR ARTHRITIS PAIN 650 MG CR tablet Take 650 mg by mouth every 6 (six) hours as needed for pain.    [provider]  valsartan-hydrochlorothiazide (DIOVAN-HCT) 160-25 MG tablet Take 1 tablet by mouth daily. 11/21/22   [provider]      Allergies    Atorvastatin, Losartan, Micardis hct [telmisartan-hctz],  Pravastatin, Statins, Telmisartan, Valsartan-hydrochlorothiazide, and Ibuprofen    Review of Systems   Review of Systems  Cardiovascular:  Positive for chest pain.    Physical Exam Updated Vital Signs BP (!) 153/73   Pulse 64   Temp 98.3 F (36.8 C) (Oral)   Resp 16   Ht 5\' 5"  (1.651 m)   Wt (!) 136.3 kg   SpO2 99%   BMI 50.01 kg/m  Physical Exam Vitals and nursing note reviewed.  Constitutional:      General: She is not in acute distress.    Appearance: She is well-developed. She is obese.  HENT:     Head: Normocephalic and atraumatic.  Eyes:     Extraocular Movements: Extraocular movements intact.  Cardiovascular:     Rate and Rhythm: Normal rate and regular rhythm.     Pulses:          Radial pulses are 2+ on the right side and 2+ on the left side.     Heart sounds: Normal heart sounds.  Pulmonary:     Effort: Pulmonary effort is normal.     Breath sounds: Normal breath sounds.  Abdominal:     Palpations: Abdomen is soft.     Tenderness: There is no abdominal tenderness.  Musculoskeletal:        General: Normal range of motion.     Cervical back: Normal range of motion and neck supple.     Right lower leg: Edema (Trace, nonpitting edema to shins) present.     Left lower leg: Edema (Trace nonpitting edema to shins) present.  Skin:    General: Skin is warm and dry.  Neurological:     General: No focal deficit present.     Mental Status: She is alert and oriented to person, place, and time.  Psychiatric:        Mood and Affect: Mood is anxious.        Behavior: Behavior normal. Behavior is not agitated.     ED Results / Procedures / Treatments   Labs (all labs ordered are listed, but only abnormal results are displayed) Labs Reviewed  CBC - Abnormal; Notable for the following components:      Result Value   RBC 3.21 (*)    Hemoglobin 9.2 (*)    HCT 28.6 (*)    All other components within normal limits  BRAIN NATRIURETIC PEPTIDE - Abnormal; Notable for  the following components:   B Natriuretic Peptide 113.5 (*)    All other components within normal limits  BASIC METABOLIC PANEL - Abnormal; Notable for the following components:   Sodium 134 (*)    BUN 24 (*)    Calcium 8.8 (*)    All other components within normal limits  URINALYSIS, ROUTINE W REFLEX MICROSCOPIC  TROPONIN I (HIGH SENSITIVITY)  TROPONIN I (HIGH SENSITIVITY)    EKG EKG Interpretation  Date/Time:  Thursday December 02 2022 12:19:30 EST  Ventricular Rate:  86 PR Interval:  185 QRS Duration: 101 QT Interval:  390 QTC Calculation: 467 R Axis:   -20 Text Interpretation: Sinus rhythm Borderline left axis deviation Duplicate EKG Confirmed by Leanord Asal ([id number]) on 12/02/2022 12:29:43 PM  Radiology DG Chest 2 View  Result Date: 12/02/2022 CLINICAL DATA:  Chest pain and elevated blood pressure EXAM: CHEST - 2 VIEW COMPARISON:  Chest x-ray November 29, 2022 FINDINGS: The cardiomediastinal silhouette is unchanged in contour. Persistent elevation of the left hemidiaphragm. No focal pulmonary opacity. No pleural effusion or pneumothorax. The visualized upper abdomen is unremarkable. No acute osseous abnormality. IMPRESSION: No acute cardiopulmonary abnormality. Electronically Signed   By: Beryle Flock M.D.   On: 12/02/2022 12:44    Procedures Procedures    Medications Ordered in ED Medications  carvedilol (COREG) tablet 6.25 mg (6.25 mg Oral Given 12/02/22 1307)    ED Course/ Medical Decision Making/ A&P Clinical Course as of 12/02/22 1518  Thu Dec 02, 2022  1517 Patient signed out to Dr. Johnney Killian pending urine with plan for likely discharge home. [VK]    Clinical Course User Index [VK] Kemper Durie, DO                             Medical Decision Making This patient presents to the ED with chief complaint(s) of chest pain, SOB with pertinent past medical history of HTN, atopic dermatitis which further complicates the presenting complaint. The  complaint involves an extensive differential diagnosis and also carries with it a high risk of complications and morbidity.    The differential diagnosis includes ACS, arrhythmia, anemia, pulmonary edema, pleural effusion, pneumothorax, electrolyte abnormality, AKI, UTI  Additional history obtained: Additional history obtained from N/A Records reviewed Primary Care Documents  ED Course and Reassessment: Patient's initial blood pressure on arrival was in the 643P systolic and when repeated in the room had improved on its own to the 295J systolic.  The patient is mildly anxious appearing but otherwise in no acute distress.  EKG on arrival no acute ischemic changes.  She will have labs including troponin and BNP and chest x-ray performed.  She will have a UA evaluated to evaluate for possible UTI.  She will be closely reassessed.  Independent labs interpretation:  The following labs were independently interpreted: mild BNP elevation, labs otherwise within normal range. UA pending  Independent visualization of imaging: - I independently visualized the following imaging with scope of interpretation limited to determining acute life threatening conditions related to emergency care: CXR, which revealed no acute disease  Consultation: - Consulted or discussed management/test interpretation w/ external professional: N/A      Amount and/or Complexity of Data Reviewed Labs: ordered. Radiology: ordered.  Risk Prescription drug management.          Final Clinical Impression(s) / ED Diagnoses Final diagnoses:  Hypertension, unspecified type  Nonspecific chest pain    Rx / DC Orders ED Discharge Orders     None         Kemper Durie, DO 12/02/22 1518

## 2022-12-02 NOTE — ED Provider Notes (Signed)
Repeat trop and UA f/u. Started on Coreg yesterday. Anticipate d/c if remainder w/u WNL Physical Exam  BP (!) 170/79   Pulse 70   Temp 98.3 F (36.8 C) (Oral)   Resp 17   Ht 5\' 5"  (1.651 m)   Wt (!) 136.3 kg   SpO2 97%   BMI 50.01 kg/m   Physical Exam  Procedures  Procedures  ED Course / MDM   Clinical Course as of 12/02/22 1845  Thu Dec 02, 2022  1517 Patient signed out to Dr. Johnney Killian pending urine with plan for likely discharge home. [VK]    Clinical Course User Index [VK] Kemper Durie, DO   Medical Decision Making Amount and/or Complexity of Data Reviewed Labs: ordered. Radiology: ordered.  Risk Prescription drug management.   Repeat troponin is normal.  Urinalysis has microscopic hematuria without other signs of infection.  Patient's blood pressure had trended up into the 170s over 90s.  She had 1 dose of carvedilol 6.25 mg.  Patient also noted swelling in her lower legs and ankles.  On exam she does have pitting edema about 1+ to 2+ on both lower legs symmetrically.  Clinically, patient does not show signs of significant acute CHF.  BNP is at 113 and chest x-ray does not show significant vascular congestion.  He may have mild volume overload or uncomplicated dependent edema.  Patient reports previously she had been on hydrochlorothiazide but became intolerant of it and thus has not had any diuretics in the past couple of days since stopping her valsartan hydrochlorothiazide a couple of days ago.  For blood pressure control, patient was given hydralazine 10 mg IV and Lasix 10 mg IV for peripheral edema.  She is tolerated this well.  Blood pressures are in the 140s over 70s and she is comfortable.  Given patient's blood pressure elevation and having just discontinued valsartan hydrochlorothiazide at 160\25 within the past 3 days, I do recommend she increase the newly prescribed carvedilol to 6.25 mg twice a day rather than once a day.  I will also give her a  short course of low-dose Lasix of 10 mg to take 1-2 times a day for peripheral edema.  Patient is advised for close follow-up with PCP for monitoring blood pressures and response to treatment.  Also, she has microscopic hematuria.  No symptoms.  At this time that is stable for outpatient follow-up.       Charlesetta Shanks, MD 12/02/22 [phone]

## 2022-12-02 NOTE — ED Notes (Signed)
Bladder scan volume: 153 ml

## 2022-12-02 NOTE — Discharge Instructions (Addendum)
You were seen in the emergency department for your high blood pressure and chest tightness. Your work up showed no signs of heart attack, fluid on your lungs or abnormalities with your kidney function or electrolytes. It is unclear what is causing your symptoms.   Take prescribed carvedilol 6.25 mg tablet 2 times daily at the same time.  You can check your blood pressure twice daily, about 3 hours after taking your medication.  And I recommend checking at the same time each day once you have been resting for at least 15 minutes and keep a log of your blood pressures.  You can take a low-dose of Lasix, half of a tablet is 10 mg, once to twice daily for the next couple of days for lower leg swelling.    You need to follow-up with your primary doctor to have your symptoms rechecked and your blood pressure rechecked within 5 to 10 days.    Your doctor also needs to follow-up on a small amount of blood in your urine.  At this time there is no sign of infection.  It is a small amount however it is important that this is closely monitored for any other more serious things that could be causing it.    You should return to the emergency department for significantly worsening chest pain, severe shortness of breath, if you pass out or if you have any other new or concerning symptoms.

## 2022-12-02 NOTE — ED Notes (Signed)
Need new green top per lab.

## 2022-12-03 ENCOUNTER — Encounter (HOSPITAL_BASED_OUTPATIENT_CLINIC_OR_DEPARTMENT_OTHER): Payer: Self-pay

## 2022-12-03 ENCOUNTER — Emergency Department (HOSPITAL_BASED_OUTPATIENT_CLINIC_OR_DEPARTMENT_OTHER)
Admission: EM | Admit: 2022-12-03 | Discharge: 2022-12-03 | Disposition: A | Discharge status: Home / Self Care | Payer: Medicare Other | Attending: Emergency Medicine | Admitting: Emergency Medicine

## 2022-12-03 DIAGNOSIS — D649 Anemia, unspecified: Secondary | ICD-10-CM | POA: Insufficient documentation

## 2022-12-03 DIAGNOSIS — R0602 Shortness of breath: Secondary | ICD-10-CM | POA: Diagnosis not present

## 2022-12-03 DIAGNOSIS — R600 Localized edema: Secondary | ICD-10-CM | POA: Insufficient documentation

## 2022-12-03 DIAGNOSIS — Z79899 Other long term (current) drug therapy: Secondary | ICD-10-CM | POA: Insufficient documentation

## 2022-12-03 DIAGNOSIS — E871 Hypo-osmolality and hyponatremia: Secondary | ICD-10-CM | POA: Insufficient documentation

## 2022-12-03 DIAGNOSIS — R609 Edema, unspecified: Secondary | ICD-10-CM

## 2022-12-03 DIAGNOSIS — I10 Essential (primary) hypertension: Secondary | ICD-10-CM | POA: Insufficient documentation

## 2022-12-03 DIAGNOSIS — R519 Headache, unspecified: Secondary | ICD-10-CM | POA: Insufficient documentation

## 2022-12-03 MED ORDER — ACETAMINOPHEN 325 MG PO TABS
650.0000 mg | ORAL_TABLET | Freq: Once | ORAL | Status: AC
  Administered 2022-12-03: 650 mg via ORAL
  Filled 2022-12-03: qty 2

## 2022-12-03 NOTE — ED Triage Notes (Signed)
RTN with Headache started after left ER yesterday. States ongoing dyspnea but "not getting any better" states compliant with medications. 8/10 adds can't get my blood pressure down, 180/90   denies chest pain.

## 2022-12-03 NOTE — ED Notes (Signed)
Alert and Oriented, Neuro Status WNL, BEFAST/ VAN negative. Voices no complaints at this time, AVS provided and reinforced making a follow up with [facility]. Address, number provided on AVS. Opportunity for questions provided prior to DC to home

## 2022-12-03 NOTE — Discharge Instructions (Addendum)
Please stay on a low-salt diet, and make sure that you are not consuming too much fluid.  Continue checking your blood pressure once a day.  Please keep a record of this and take that record with you when you see any of your physicians.  You may take acetaminophen as needed for pain.  It will not interact with any of your medications.  Please follow-up with the cardiologist for management of your blood pressure and leg swelling.

## 2022-12-03 NOTE — ED Provider Notes (Signed)
[facility] EMERGENCY DEPARTMENT Provider Note   CSN: [id number] Arrival date & time: 12/03/22  0554     History  Chief Complaint  Patient presents with  . Shortness of Breath  . Headache    Jenny Martin is a 77 y.o. female.  The history is provided by the patient.  Shortness of Breath Associated symptoms: headaches   Headache She has history of hypertension, hyperlipidemia, atopic dermatitis and comes in because of ongoing problems with headaches.  She actually states that it is a head pain and not a headache but she points to the frontal portion of the head and the vertex.  Headache started after starting on carvedilol 2 days ago.  Carvedilol was being given for hypertension.  She has had problems with numerous antihypertensive medications including hydrochlorothiazide, angiotensin receptor blockers.  She also had an episode of hyponatremia which was felt to be at least partly due to furosemide.  Today, she denies fever or chills and denies photophobia or phonophobia.  There has been some nausea but no vomiting.  She is very frustrated because she has been on multiple medications for her blood pressure and has had to stop them for variety of reasons.  She was in the emergency department yesterday, but states that she did not feel any better and she left.   Home Medications Prior to Admission medications   Medication Sig Start Date End Date Taking? Authorizing Provider  albuterol (VENTOLIN HFA) 108 (90 Base) MCG/ACT inhaler Inhale 2 puffs into the lungs every 6 (six) hours as needed for shortness of breath or wheezing. 08/12/21   [provider]  BAYER ASPIRIN 325 MG tablet Take 325 mg by mouth See admin instructions. Take 325 mg by mouth at bedtime three to four nights a week    [provider]  Calcium Citrate-Vitamin D (CITRACAL/VITAMIN D PO) Take 1 tablet by mouth every other day.    [provider]  carvedilol (COREG) 6.25 MG tablet Take 6.25  mg by mouth daily. 12/01/22   [provider]  DULCOLAX 1200 MG/15ML suspension Take 30 mLs by mouth daily as needed for mild constipation.    [provider]  ezetimibe (ZETIA) 10 MG tablet Take 10 mg by mouth daily. 10/31/21   [provider]  ferrous sulfate 325 (65 FE) MG tablet Take 325 mg by mouth every other day.    [provider]  fluticasone (FLONASE) 50 MCG/ACT nasal spray Place 1 spray into both nostrils daily as needed for allergies.    [provider]  furosemide (LASIX) 20 MG tablet Take 0.5 tablets (10 mg total) by mouth 2 (two) times daily. 12/02/22   Charlesetta Shanks, MD  magnesium oxide (MAG-OX) 400 MG tablet Take 400-800 mg by mouth See admin instructions. Take 400 mg by mouth once a day as needed for deficient magnesium and 800 mg once a day as needed for constipation 12/03/21   [provider]  Multiple Vitamins-Minerals (CENTRUM SILVER 50+WOMEN) TABS Take 1 tablet by mouth daily with breakfast.    [provider]  Olopatadine HCl 0.2 % SOLN Place 1 drop into both eyes daily as needed (dry eyes and allergies).    [provider]  potassium chloride (KLOR-CON) 10 MEQ tablet Take 1 tablet (10 mEq total) by mouth daily. 09/25/22 10/25/22  Barb Merino, MD  telmisartan (MICARDIS) 80 MG tablet Take 1 tablet (80 mg total) by mouth daily. 09/25/22 12/24/22  Barb Merino, MD  triamcinolone cream (KENALOG) 0.1 %  Apply 1 application  topically 2 (two) times daily as needed (for atopic dermatitis- affected areas).    [provider]  TYLENOL 8 HOUR ARTHRITIS PAIN 650 MG CR tablet Take 650 mg by mouth every 6 (six) hours as needed for pain.    [provider]  valsartan-hydrochlorothiazide (DIOVAN-HCT) 160-25 MG tablet Take 1 tablet by mouth daily. 11/21/22   [provider]      Allergies    Atorvastatin, Losartan, Micardis hct [telmisartan-hctz], Pravastatin, Statins, Telmisartan,  Valsartan-hydrochlorothiazide, and Ibuprofen    Review of Systems   Review of Systems  Respiratory:  Positive for shortness of breath.   Neurological:  Positive for headaches.  All other systems reviewed and are negative.   Physical Exam Updated Vital Signs SpO2 100%  Physical Exam Vitals and nursing note reviewed.   77 year old female, resting comfortably and in no acute distress. Vital signs are significant for elevated blood pressure and mildly elevated respiratory rate. Oxygen saturation is 100%, which is normal. Head is normocephalic and atraumatic. PERRLA, EOMI. Oropharynx is clear. Neck is nontender and supple without adenopathy or JVD. Back is nontender and there is no CVA tenderness. Lungs are clear without rales, wheezes, or rhonchi. Chest is nontender. Heart has regular rate and rhythm with 2/6 late systolic murmur best heard in the aortic area. Abdomen is soft, flat, nontender. Extremities have 2+ edema, full range of motion is present. Skin is warm and dry without rash. Neurologic: Mental status is normal, cranial nerves are intact, moves all extremities equally.  ED Results / Procedures / Treatments   Labs (all labs ordered are listed, but only abnormal results are displayed) Labs Reviewed - No data to display  EKG None  Radiology DG Chest 2 View  Result Date: 12/02/2022 CLINICAL DATA:  Chest pain and elevated blood pressure EXAM: CHEST - 2 VIEW COMPARISON:  Chest x-ray November 29, 2022 FINDINGS: The cardiomediastinal silhouette is unchanged in contour. Persistent elevation of the left hemidiaphragm. No focal pulmonary opacity. No pleural effusion or pneumothorax. The visualized upper abdomen is unremarkable. No acute osseous abnormality. IMPRESSION: No acute cardiopulmonary abnormality. Electronically Signed   By: Beryle Flock M.D.   On: 12/02/2022 12:44    Procedures Procedures  Cardiac monitor shows normal sinus rhythm, per my  interpretation.  Medications Ordered in ED Medications  acetaminophen (TYLENOL) tablet 650 mg (650 mg Oral Given 12/03/22 0617)    ED Course/ Medical Decision Making/ A&P Clinical Course as of 12/03/22 0707  Fri Dec 03, 2022  0702 Stable 89 YOF with HTN/hx of hypona, started on carvedilol recently. Here with ccx of headache. Given tylenol. She can be reassessed and dispo if improving.  Will be referred back to cards for further OP care and management. [CC]    Clinical Course User Index [CC] Tretha Sciara, MD                             Medical Decision Making  Headache which is likely is secondary to starting carvedilol.  No evidence of serious pathology such as subarachnoid hemorrhage, meningitis.  Headache pattern is not suggestive of migraine.  I have reviewed her past records, and she had been admitted to the hospital on 09/30/2022 for hyponatremia which was felt to be due to a combination of diuretic use and excessive water intake.  She was in the emergency department yesterday the workup showed very mild hyponatremia, stable anemia, minimally elevated brain  natruretic protein, normal troponin.  I do not see an indication for repeating these laboratory tests.  She has not taken any medication for headache, I have ordered a dose of acetaminophen and ibuprofen.  Patient needs to decide whether she wants to continue with carvedilol and see if her headache improves, or if she feels that the headaches are too severe and wishes to discontinue the medication.  Case is complicated enough that she may benefit from seeing either cardiologist or nephrologist to work with her regarding her blood pressure and peripheral edema.  I have ordered a dose of acetaminophen for her headache.  I had an extensive discussion with the patient regarding her medications and she does not want to take carvedilol any further even though she has been advised that headaches will likely resolve after 1-2 weeks on the  medication.  I will refer her to cardiology here for outpatient workup and treatment of her high blood pressure.  Case is signed out to Dr. Doran Durand, oncoming physician.  Final Clinical Impression(s) / ED Diagnoses Final diagnoses:  Elevated blood pressure reading with diagnosis of hypertension  Nonintractable headache, unspecified chronicity pattern, unspecified headache type  Normochromic normocytic anemia  Peripheral edema    Rx / DC Orders ED Discharge Orders     None         Dione Booze, MD 12/03/22 [phone]

## 2022-12-03 NOTE — ED Provider Notes (Signed)
Clinical Course as of 12/03/22 0708  Jenny Martin Dec 03, 2022  0702 Stable 23 YOF with HTN/hx of hypona, started on carvedilol recently. Here with ccx of headache. Given tylenol. She can be reassessed and dispo if improving.  Will be referred back to cards for further OP care and management. [CC]    Clinical Course User Index [CC] Tretha Sciara, MD   Reevaluated [name] bedside.  She states that all of her symptoms.  Resolved. Reviewed return from yesterday.  No focal pathology identified, she is ambulatory tolerating p.o. intake in no acute distress feels that her shortness of breath and headache have all resolved.  No acute indication for further evaluation per plan from prior provider.  Patient discharged with no further acute events.   Tretha Sciara, MD 12/03/22 [phone]

## 2022-12-06 DIAGNOSIS — I10 Essential (primary) hypertension: Secondary | ICD-10-CM | POA: Insufficient documentation

## 2022-12-06 DIAGNOSIS — Z8619 Personal history of other infectious and parasitic diseases: Secondary | ICD-10-CM | POA: Insufficient documentation

## 2022-12-06 DIAGNOSIS — E669 Obesity, unspecified: Secondary | ICD-10-CM | POA: Insufficient documentation

## 2022-12-06 DIAGNOSIS — D649 Anemia, unspecified: Secondary | ICD-10-CM | POA: Insufficient documentation

## 2022-12-06 DIAGNOSIS — Z9889 Other specified postprocedural states: Secondary | ICD-10-CM | POA: Insufficient documentation

## 2022-12-06 DIAGNOSIS — G930 Cerebral cysts: Secondary | ICD-10-CM | POA: Insufficient documentation

## 2022-12-06 DIAGNOSIS — G5600 Carpal tunnel syndrome, unspecified upper limb: Secondary | ICD-10-CM | POA: Insufficient documentation

## 2022-12-06 DIAGNOSIS — Z5189 Encounter for other specified aftercare: Secondary | ICD-10-CM | POA: Insufficient documentation

## 2022-12-07 ENCOUNTER — Ambulatory Visit: Payer: Medicare Other | Attending: Cardiology | Admitting: Cardiology

## 2022-12-07 ENCOUNTER — Telehealth: Payer: Self-pay | Admitting: Cardiology

## 2022-12-07 ENCOUNTER — Encounter: Payer: Self-pay | Admitting: Cardiology

## 2022-12-07 VITALS — BP 180/73 | HR 89 | Ht 65.0 in | Wt 290.0 lb

## 2022-12-07 DIAGNOSIS — I10 Essential (primary) hypertension: Secondary | ICD-10-CM

## 2022-12-07 DIAGNOSIS — R011 Cardiac murmur, unspecified: Secondary | ICD-10-CM | POA: Diagnosis not present

## 2022-12-07 DIAGNOSIS — E782 Mixed hyperlipidemia: Secondary | ICD-10-CM

## 2022-12-07 HISTORY — DX: Essential (primary) hypertension: I10

## 2022-12-07 HISTORY — DX: Cardiac murmur, unspecified: R01.1

## 2022-12-07 HISTORY — DX: Morbid (severe) obesity due to excess calories: E66.01

## 2022-12-07 MED ORDER — CLONIDINE HCL 0.1 MG PO TABS: 0.1000 mg | ORAL_TABLET | Freq: Two times a day (BID) | ORAL | 6 refills | Status: DC

## 2022-12-07 NOTE — Telephone Encounter (Signed)
No vm or answer.

## 2022-12-07 NOTE — Progress Notes (Signed)
Cardiology Office Note:    Date:  12/07/2022   ID:  Jenny Martin, DOB 1945/12/23, MRN [id number]  PCP:  Houston Siren., MD  Cardiologist:  Jenean Lindau, MD   Referring MD: Delora Fuel, MD    ASSESSMENT:    1. Uncontrolled hypertension   2. Mixed hyperlipidemia   3. Murmur, cardiac   4. Morbid obesity ([place])    PLAN:    In order of problems listed above:  Primary prevention stressed with the patient.  Importance of compliance with diet medication stressed and she vocalized understanding. Uncontrolled hypertension: I discussed findings with the patient at length.  Lifestyle modification discussed.  Salt intake issues discussed and she vocalized understanding and questions were answered to her satisfaction.  Currently she is only taking furosemide 20 mg daily.  I started clonidine point 1 mg twice daily.  She will keep a track of her pulse blood pressure in a week and get it back to [place].  Other relaxation techniques such as meditation and yoga were encouraged. Cardiac murmur: Echocardiogram will be done to assess murmur on auscultation. Mixed dyslipidemia: Lipids followed by primary care. Morbid obesity: Weight reduction stressed and diet was emphasized and she promises to do better. Abdominal bruit: Will do assessment for renal artery stenosis. She will be seen in follow-up appointment in 4 to 6 weeks or earlier if she has any concerns.  Multiple questions which were answered to her satisfaction.   Medication Adjustments/Labs and Tests Ordered: Current medicines are reviewed at length with the patient today.  Concerns regarding medicines are outlined above.  No orders of the defined types were placed in this encounter.  No orders of the defined types were placed in this encounter.    History of Present Illness:    Jenny Martin is a 77 y.o. female who is being seen today for the evaluation of uncontrolled hypertension at the request of Delora Fuel, MD. patient has past  medical history of essential hypertension and mixed dyslipidemia.  She is morbidly obese and leads a sedentary lifestyle.  She has intolerance to multiple blood pressure medications.  She denies any chest pain orthopnea or PND.  At the time of my evaluation, the patient is alert awake oriented and in no distress.  EKG revealed sinus rhythm and nonspecific ST-T changes.  She has intolerance to multiple medications.  These include hyponatremia and issues of swelling with ACE inhibitor's.  Wheezing with beta-blocker such as carvedilol. 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   Edema 12/10/2015   Gastroesophageal reflux disease without esophagitis 06/08/2022   H/O bladder infections    History of chicken pox    HTN (hypertension), benign 01/03/2022   Hypercholesterolemia    Hyperlipidemia 12/10/2015   Hyperopia with astigmatism and presbyopia, bilateral 12/10/2015   Hypertension    Hypo-osmolality and hyponatremia 01/08/2022   Hypomagnesemia 01/03/2022   Hyponatremia 01/02/2022   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 04/27/2018   Mild intermittent asthma 12/16/2015   Need for prophylactic vaccination with Streptococcus pneumoniae (Pneumococcus) and Influenza vaccines 12/16/2015   Normocytic anemia 01/03/2022   Nuclear  sclerotic cataract of both eyes 04/25/2017   Obese    Pneumonia, pneumococcal (HCC)    S/P thoracotomy    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   BAYER ASPIRIN 325 MG tablet Take 325 mg by mouth See admin instructions. Take 325 mg by mouth at bedtime three to four nights a week   Calcium Citrate-Vitamin D (CITRACAL/VITAMIN D PO) Take 1 tablet by mouth every other day.   carvedilol (COREG) 6.25 MG tablet Take 6.25 mg by mouth daily.   DULCOLAX 1200 MG/15ML suspension Take 30 mLs by mouth daily as needed for mild constipation.   ezetimibe (ZETIA) 10 MG tablet Take 10 mg by mouth daily.   ferrous sulfate 325 (65 FE) MG tablet Take 325 mg by mouth every other day.   fluticasone (FLONASE) 50 MCG/ACT nasal spray Place 1 spray into both nostrils daily as needed for allergies.   furosemide (LASIX) 20 MG tablet Take 0.5 tablets (10 mg total) by mouth 2 (two) times daily.   IRON CR PO Take 1 tablet by mouth daily.   magnesium oxide (MAG-OX) 400 MG tablet Take 400-800 mg by mouth See admin instructions. Take 400 mg by mouth once a day as needed for deficient magnesium and 800 mg once a day as needed for constipation   Multiple Vitamins-Minerals (CENTRUM SILVER 50+WOMEN) TABS Take 1 tablet by mouth daily with breakfast.   Olopatadine HCl 0.2 % SOLN Place 1 drop into both eyes daily as needed (dry eyes and allergies).   telmisartan (MICARDIS) 80 MG tablet Take 1 tablet (80 mg total) by mouth daily.   triamcinolone cream (KENALOG) 0.1 % Apply 1 application  topically 2 (two) times daily as needed (for atopic dermatitis- affected areas).   TYLENOL 8 HOUR ARTHRITIS PAIN 650 MG CR tablet Take 650 mg by mouth every 6 (six) hours as needed for pain.   valsartan-hydrochlorothiazide (DIOVAN-HCT) 160-25 MG tablet Take 1 tablet by mouth daily.     Allergies:   Atorvastatin, Losartan, Micardis hct [telmisartan-hctz], Pravastatin, Statins, Telmisartan,  Valsartan-hydrochlorothiazide, and Ibuprofen   Social History   Socioeconomic History   Marital status: Divorced    Spouse name: Not on file   Number of children: Not on file   Years of education: Not on file   Highest education level: Not on file  Occupational History   Not on file  Tobacco Use   Smoking status: Never   Smokeless tobacco: Never  Vaping Use   Vaping Use: Never used  Substance and Sexual Activity   Alcohol use: No   Drug use: No   Sexual activity: Never  Other Topics Concern   Not on file  Social History Narrative   Not on file   Social Determinants of Health   Financial Resource Strain: Not on file  Food Insecurity: No Food Insecurity (09/30/2022)   Hunger Vital Sign    Worried About Running Out of Food in the Last Year: Never true    Ran Out of Food in the Last Year: Never true  Transportation Needs: No Transportation Needs (09/30/2022)   PRAPARE -  Hydrologist (Medical): No    Lack of Transportation (Non-Medical): No  Physical Activity: Not on file  Stress: Not on file  Social Connections: Not on file     Family History: The patient's family history includes COPD in her mother; Stroke in her father.  ROS:   Please see the history of present illness.    All other systems reviewed and are negative.  EKGs/Labs/Other Studies Reviewed:    The following studies were reviewed today: EKG reveals sinus rhythm and nonspecific ST-T changes.   Recent Labs: 09/24/2022: Magnesium 1.8 09/30/2022: ALT 23 12/02/2022: B Natriuretic Peptide 113.5; BUN 24; Creatinine, Ser 0.83; Hemoglobin 9.2; Platelets 233; Potassium 4.3; Sodium 134  Recent Lipid Panel No results found for: "CHOL", "TRIG", "HDL", "CHOLHDL", "VLDL", "LDLCALC", "LDLDIRECT"  Physical Exam:    VS:  BP (!) 180/73   Pulse 89   Ht 5\' 5"  (1.651 m)   Wt 290 lb (131.5 kg)   SpO2 97%   BMI 48.26 kg/m     Wt Readings from Last 3 Encounters:  12/07/22 290 lb  (131.5 kg)  12/03/22 300 lb (136.1 kg)  12/02/22 (!) 300 lb 8 oz (136.3 kg)     GEN: Patient is in no acute distress HEENT: Normal NECK: No JVD; No carotid bruits LYMPHATICS: No lymphadenopathy CARDIAC: S1 S2 regular, 2/6 systolic murmur at the apex. RESPIRATORY:  Clear to auscultation without rales, wheezing or rhonchi  ABDOMEN: Soft, non-tender, non-distended MUSCULOSKELETAL:  No edema; No deformity  SKIN: Warm and dry NEUROLOGIC:  Alert and oriented x 3 PSYCHIATRIC:  Normal affect    Signed, Jenean Lindau, MD  12/07/2022 10:21 AM    [place]

## 2022-12-07 NOTE — Patient Instructions (Signed)
Medication Instructions:  Your physician has recommended you make the following change in your medication:   Start Clonidine 0.1 mg twice daily. Keep a BP/HR log for 1 week and mail to our office or send by MyChart. Dr. Geraldo Pitter [address] [place], [place] [address]  *If you need a refill on your cardiac medications before your next appointment, please call your pharmacy*   Lab Work: None ordered If you have labs (blood work) drawn today and your tests are completely normal, you will receive your results only by: [place] (if you have MyChart) OR A paper copy in the mail If you have any lab test that is abnormal or we need to change your treatment, we will call you to review the results.   Testing/Procedures: Your physician has requested that you have an echocardiogram. Echocardiography is a painless test that uses sound waves to create images of your heart. It provides your doctor with information about the size and shape of your heart and how well your heart's chambers and valves are working. This procedure takes approximately one hour. There are no restrictions for this procedure. Please do NOT wear cologne, perfume, aftershave, or lotions (deodorant is allowed). Please arrive 15 minutes prior to your appointment time.  Your physician has requested that you have a renal artery duplex. During this test, an ultrasound is used to evaluate blood flow to the kidneys. Allow one hour for this exam. Do not eat after midnight the day before and avoid carbonated beverages. Take your medications as you usually do.    Follow-Up: At [facility], you and your health needs are our priority.  As part of our continuing mission to provide you with exceptional heart care, we have created designated Provider Care Teams.  These Care Teams include your primary Cardiologist (physician) and Advanced Practice Providers (APPs -  Physician Assistants and Nurse Practitioners) who all work together to  provide you with the care you need, when you need it.  We recommend signing up for the patient portal called "MyChart".  Sign up information is provided on this After Visit Summary.  MyChart is used to connect with patients for Virtual Visits (Telemedicine).  Patients are able to view lab/test results, encounter notes, upcoming appointments, etc.  Non-urgent messages can be sent to your provider as well.   To learn more about what you can do with MyChart, go to [email or web address].    Your next appointment:   4-6 week(s)  The format for your next appointment:   In Person  Provider:   Jyl Heinz, MD   Other Instructions Echocardiogram An echocardiogram is a test that uses sound waves (ultrasound) to produce images of the heart. Images from an echocardiogram can provide important information about: Heart size and shape. The size and thickness and movement of your heart's walls. Heart muscle function and strength. Heart valve function or if you have stenosis. Stenosis is when the heart valves are too narrow. If blood is flowing backward through the heart valves (regurgitation). A tumor or infectious growth around the heart valves. Areas of heart muscle that are not working well because of poor blood flow or injury from a heart attack. Aneurysm detection. An aneurysm is a weak or damaged part of an artery wall. The wall bulges out from the normal force of blood pumping through the body. Tell a health care provider about: Any allergies you have. All medicines you are taking, including vitamins, herbs, eye drops, creams, and over-the-counter medicines. Any blood  disorders you have. Any surgeries you have had. Any medical conditions you have. Whether you are pregnant or may be pregnant. What are the risks? Generally, this is a safe test. However, problems may occur, including an allergic reaction to dye (contrast) that may be used during the test. What happens before the  test? No specific preparation is needed. You may eat and drink normally. What happens during the test? You will take off your clothes from the waist up and put on a hospital gown. Electrodes or electrocardiogram (ECG)patches may be placed on your chest. The electrodes or patches are then connected to a device that monitors your heart rate and rhythm. You will lie down on a table for an ultrasound exam. A gel will be applied to your chest to help sound waves pass through your skin. A handheld device, called a transducer, will be pressed against your chest and moved over your heart. The transducer produces sound waves that travel to your heart and bounce back (or "echo" back) to the transducer. These sound waves will be captured in real-time and changed into images of your heart that can be viewed on a video monitor. The images will be recorded on a computer and reviewed by your health care provider. You may be asked to change positions or hold your breath for a short time. This makes it easier to get different views or better views of your heart. In some cases, you may receive contrast through an IV in one of your veins. This can improve the quality of the pictures from your heart. The procedure may vary among health care providers and hospitals.   What can I expect after the test? You may return to your normal, everyday life, including diet, activities, and medicines, unless your health care provider tells you not to do that. Follow these instructions at home: It is up to you to get the results of your test. Ask your health care provider, or the department that is doing the test, when your results will be ready. Keep all follow-up visits. This is important. Summary An echocardiogram is a test that uses sound waves (ultrasound) to produce images of the heart. Images from an echocardiogram can provide important information about the size and shape of your heart, heart muscle function, heart valve  function, and other possible heart problems. You do not need to do anything to prepare before this test. You may eat and drink normally. After the echocardiogram is completed, you may return to your normal, everyday life, unless your health care provider tells you not to do that. This information is not intended to replace advice given to you by your health care provider. Make sure you discuss any questions you have with your health care provider. Document Revised: 06/24/2020 Document Reviewed: 06/24/2020 Elsevier Patient Education  [place]

## 2022-12-07 NOTE — Telephone Encounter (Signed)
Patient would like to know if she can be referred to a PCP on the 2nd floor here in [place]. CB # [phone]

## 2022-12-08 NOTE — Telephone Encounter (Signed)
Message sent to patient in [place].

## 2022-12-15 ENCOUNTER — Ambulatory Visit (HOSPITAL_COMMUNITY)
Admission: RE | Admit: 2022-12-15 | Discharge: 2022-12-15 | Disposition: A | Discharge status: Home / Self Care | Payer: Medicare Other | Source: Ambulatory Visit | Attending: Cardiology | Admitting: Cardiology

## 2022-12-15 DIAGNOSIS — I10 Essential (primary) hypertension: Secondary | ICD-10-CM

## 2022-12-16 ENCOUNTER — Emergency Department (HOSPITAL_BASED_OUTPATIENT_CLINIC_OR_DEPARTMENT_OTHER)
Admission: EM | Admit: 2022-12-16 | Discharge: 2022-12-16 | Disposition: A | Discharge status: Home / Self Care | Payer: Medicare Other | Attending: Emergency Medicine | Admitting: Emergency Medicine

## 2022-12-16 ENCOUNTER — Encounter (HOSPITAL_BASED_OUTPATIENT_CLINIC_OR_DEPARTMENT_OTHER): Payer: Self-pay

## 2022-12-16 ENCOUNTER — Emergency Department (HOSPITAL_BASED_OUTPATIENT_CLINIC_OR_DEPARTMENT_OTHER): Payer: Medicare Other

## 2022-12-16 ENCOUNTER — Other Ambulatory Visit: Payer: Self-pay

## 2022-12-16 DIAGNOSIS — J45909 Unspecified asthma, uncomplicated: Secondary | ICD-10-CM | POA: Insufficient documentation

## 2022-12-16 DIAGNOSIS — R0789 Other chest pain: Secondary | ICD-10-CM | POA: Insufficient documentation

## 2022-12-16 DIAGNOSIS — E871 Hypo-osmolality and hyponatremia: Secondary | ICD-10-CM | POA: Insufficient documentation

## 2022-12-16 DIAGNOSIS — Z7982 Long term (current) use of aspirin: Secondary | ICD-10-CM | POA: Diagnosis not present

## 2022-12-16 DIAGNOSIS — Z79899 Other long term (current) drug therapy: Secondary | ICD-10-CM | POA: Diagnosis not present

## 2022-12-16 DIAGNOSIS — I10 Essential (primary) hypertension: Secondary | ICD-10-CM | POA: Insufficient documentation

## 2022-12-16 DIAGNOSIS — R0602 Shortness of breath: Secondary | ICD-10-CM

## 2022-12-16 DIAGNOSIS — R600 Localized edema: Secondary | ICD-10-CM | POA: Diagnosis not present

## 2022-12-16 DIAGNOSIS — Z7951 Long term (current) use of inhaled steroids: Secondary | ICD-10-CM | POA: Diagnosis not present

## 2022-12-16 LAB — CBC
HCT: 30.1 % — ABNORMAL LOW (ref 36.0–46.0)
Hemoglobin: 9.9 g/dL — ABNORMAL LOW (ref 12.0–15.0)
MCH: 28.4 pg (ref 26.0–34.0)
MCHC: 32.9 g/dL (ref 30.0–36.0)
MCV: 86.5 fL (ref 80.0–100.0)
Platelets: 245 10*3/uL (ref 150–400)
RBC: 3.48 MIL/uL — ABNORMAL LOW (ref 3.87–5.11)
RDW: 14 % (ref 11.5–15.5)
WBC: 3.8 10*3/uL — ABNORMAL LOW (ref 4.0–10.5)
nRBC: 0 % (ref 0.0–0.2)

## 2022-12-16 LAB — BASIC METABOLIC PANEL
Anion gap: 10 (ref 5–15)
BUN: 15 mg/dL (ref 8–23)
CO2: 26 mmol/L (ref 22–32)
Calcium: 10.4 mg/dL — ABNORMAL HIGH (ref 8.9–10.3)
Chloride: 96 mmol/L — ABNORMAL LOW (ref 98–111)
Creatinine, Ser: 0.94 mg/dL (ref 0.44–1.00)
GFR, Estimated: >60 mL/min (ref >60)
Glucose, Bld: 104 mg/dL — ABNORMAL HIGH (ref 70–99)
Potassium: 4 mmol/L (ref 3.5–5.1)
Sodium: 132 mmol/L — ABNORMAL LOW (ref 135–145)

## 2022-12-16 LAB — TROPONIN I (HIGH SENSITIVITY)
Troponin I (High Sensitivity): 7 ng/L (ref <18)
Troponin I (High Sensitivity): 12 ng/L (ref <18)

## 2022-12-16 LAB — BRAIN NATRIURETIC PEPTIDE: B Natriuretic Peptide: 101.6 pg/mL — ABNORMAL HIGH (ref 0.0–100.0)

## 2022-12-16 NOTE — ED Notes (Signed)
Called lab to add on troponin  

## 2022-12-16 NOTE — ED Notes (Addendum)
Was out doing errands, began having a dry mouth, then onset of rt arm, tingling sensation, chest began feeling tight and then shortness of breath as well. States chest tightness was in area of rt upper chest to rt shoulder area. Recently changed her BP medication.

## 2022-12-16 NOTE — ED Provider Notes (Signed)
[facility] Provider Note   CSN: [id number] Arrival date & time: 12/16/22  1055     History  Chief Complaint  Patient presents with   Shortness of Breath    Jenny Martin is a 77 y.o. female. With past medical history of hypertension, anemia, GERD, hyperlipidemia, hypertension who presents to the emergency department with shortness of breath.  States this morning just prior to 11:00 she was driving when she states that her mouth became totally dry.  She states that just after this her right arm began to have a tingling sensation and then she began having chest tightness and feeling short of breath.  She states that she was on her way home to take her home clonidine when she decided to come here to be evaluated.  States that since arriving the chest tightness and shortness of breath has abated however when she got up to use the restroom.  She states that she felt mildly short of breath again, which stopped with rest.  States that she is stopped taking her Lasix about 6 days ago because they were "not working."  Has been having worsening lower extremity edema.  Additionally she states that she recently started clonidine about 1 week ago and has been having dry mouth and intermittent rash on her neck from this medication.  States that she cannot drink a lot of fluids because she is on water restriction due to her hyponatremia.  She denies having cough or fever, palpitations, syncope    Shortness of Breath      Home Medications Prior to Admission medications   Medication Sig Start Date End Date Taking? Authorizing Provider  albuterol (VENTOLIN HFA) 108 (90 Base) MCG/ACT inhaler Inhale 2 puffs into the lungs every 6 (six) hours as needed for shortness of breath or wheezing. 08/12/21   [provider]  BAYER ASPIRIN 325 MG tablet Take 325 mg by mouth See admin instructions. Take 325 mg by mouth at bedtime three to four nights a week     [provider]  Calcium Citrate-Vitamin D (CITRACAL/VITAMIN D PO) Take 1 tablet by mouth every other day.    [provider]  carvedilol (COREG) 6.25 MG tablet Take 6.25 mg by mouth daily. 12/01/22   [provider]  cloNIDine (CATAPRES) 0.1 MG tablet Take 1 tablet (0.1 mg total) by mouth 2 (two) times daily. 12/07/22   Revankar, Aundra Dubin, MD  DULCOLAX 1200 MG/15ML suspension Take 30 mLs by mouth daily as needed for mild constipation.    [provider]  ezetimibe (ZETIA) 10 MG tablet Take 10 mg by mouth daily. 10/31/21   [provider]  ferrous sulfate 325 (65 FE) MG tablet Take 325 mg by mouth every other day.    [provider]  fluticasone (FLONASE) 50 MCG/ACT nasal spray Place 1 spray into both nostrils daily as needed for allergies.    [provider]  furosemide (LASIX) 20 MG tablet Take 0.5 tablets (10 mg total) by mouth 2 (two) times daily. 12/02/22   Arby Barrette, MD  IRON CR PO Take 1 tablet by mouth daily.    [provider]  magnesium oxide (MAG-OX) 400 MG tablet Take 400-800 mg by mouth See admin instructions. Take 400 mg by mouth once a day as needed for deficient magnesium and 800 mg once a day as needed for constipation 12/03/21   [provider]  Multiple Vitamins-Minerals (CENTRUM SILVER 50+WOMEN) TABS Take 1  tablet by mouth daily with breakfast.    [provider]  Olopatadine HCl 0.2 % SOLN Place 1 drop into both eyes daily as needed (dry eyes and allergies).    [provider]  potassium chloride (KLOR-CON) 10 MEQ tablet Take 1 tablet (10 mEq total) by mouth daily. 09/25/22 10/25/22  Barb Merino, MD  telmisartan (MICARDIS) 80 MG tablet Take 1 tablet (80 mg total) by mouth daily. 09/25/22 12/24/22  Barb Merino, MD  triamcinolone cream (KENALOG) 0.1 % Apply 1 application  topically 2 (two) times daily as needed (for atopic dermatitis- affected areas).    [provider]   TYLENOL 8 HOUR ARTHRITIS PAIN 650 MG CR tablet Take 650 mg by mouth every 6 (six) hours as needed for pain.    [provider]  valsartan-hydrochlorothiazide (DIOVAN-HCT) 160-25 MG tablet Take 1 tablet by mouth daily. 11/21/22   [provider]      Allergies    Atorvastatin, Losartan, Micardis hct [telmisartan-hctz], Pravastatin, Statins, Telmisartan, Valsartan-hydrochlorothiazide, and Ibuprofen    Review of Systems   Review of Systems  Respiratory:  Positive for chest tightness and shortness of breath.   Cardiovascular:  Positive for leg swelling. Negative for palpitations.  All other systems reviewed and are negative.   Physical Exam Updated Vital Signs BP (!) 173/72 (BP Location: Right Arm)   Pulse 72   Temp 98.4 F (36.9 C)   Resp 18   Ht 5\' 5"  (1.651 m)   Wt 130.2 kg   SpO2 100%   BMI 47.76 kg/m  Physical Exam Vitals and nursing note reviewed.  Constitutional:      General: She is not in acute distress.    Appearance: Normal appearance. She is obese. She is not ill-appearing or toxic-appearing.  HENT:     Head: Normocephalic.     Mouth/Throat:     Pharynx: Oropharynx is clear.  Eyes:     General: No scleral icterus.    Pupils: Pupils are equal, round, and reactive to light.  Neck:     Vascular: No JVD.  Cardiovascular:     Rate and Rhythm: Normal rate and regular rhythm.     Pulses: Normal pulses.          Radial pulses are 2+ on the right side and 2+ on the left side.     Heart sounds: No murmur heard. Pulmonary:     Effort: Pulmonary effort is normal. No tachypnea.     Breath sounds: Normal breath sounds. No wheezing, rhonchi or rales.  Chest:     Chest wall: No tenderness.  Abdominal:     General: Bowel sounds are normal.     Palpations: Abdomen is soft.  Musculoskeletal:        General: Normal range of motion.     Cervical back: Normal range of motion.     Right lower leg: 1+ Pitting Edema present.     Left lower leg: 1+ Pitting  Edema present.  Skin:    General: Skin is warm and dry.     Capillary Refill: Capillary refill takes less than 2 seconds.  Neurological:     General: No focal deficit present.     Mental Status: She is alert and oriented to person, place, and time.  Psychiatric:        Mood and Affect: Mood normal.        Behavior: Behavior normal.     ED Results / Procedures / Treatments   Labs (all  labs ordered are listed, but only abnormal results are displayed) Labs Reviewed  BASIC METABOLIC PANEL - Abnormal; Notable for the following components:      Result Value   Sodium 132 (*)    Chloride 96 (*)    Glucose, Bld 104 (*)    Calcium 10.4 (*)    All other components within normal limits  CBC - Abnormal; Notable for the following components:   WBC 3.8 (*)    RBC 3.48 (*)    Hemoglobin 9.9 (*)    HCT 30.1 (*)    All other components within normal limits  BRAIN NATRIURETIC PEPTIDE - Abnormal; Notable for the following components:   B Natriuretic Peptide 101.6 (*)    All other components within normal limits  TROPONIN I (HIGH SENSITIVITY)  TROPONIN I (HIGH SENSITIVITY)    EKG EKG Interpretation  Date/Time:  Thursday December 16 2022 11:04:53 EST Ventricular Rate:  93 PR Interval:  187 QRS Duration: 103 QT Interval:  387 QTC Calculation: 482 R Axis:   6 Text Interpretation: Sinus rhythm Confirmed by Vivi Barrack [phone]) on 12/16/2022 1:28:51 PM  Radiology DG Chest 2 View  Result Date: 12/16/2022 CLINICAL DATA:  Shortness of breath. EXAM: CHEST - 2 VIEW COMPARISON:  12/02/2022 FINDINGS: The cardiac silhouette, mediastinal and hilar contours are within normal limits and stable. 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 No pulmonary infiltrates or pleural effusions. No pneumothorax. The bony thorax is intact. IMPRESSION: 1. No acute cardiopulmonary findings. 2. 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  Electronically Signed   By: Rudie Meyer M.D.   On: 12/16/2022 11:24   VAS US RENAL ARTERY DUPLEX  Result Date: 12/15/2022 ABDOMINAL VISCERAL Patient Name:  Jenny Martin  Date of Exam:   12/15/2022 Medical Rec #: [id number]        Accession #:    [id number] Date of Birth: April 17, 1946         Patient Gender: F Patient Age:   60 years Exam Location:  [facility] Procedure:      VAS US RENAL ARTERY DUPLEX Referring Phys: Rito Ehrlich [facility] -------------------------------------------------------------------------------- Indications: Uncontrolled hypertension. She denies abdominal/flank pain. High Risk Factors: Hypertension, hyperlipidemia, no history of smoking. Other Factors: Recent blood work 12/02/2022 BUN 24 Creatinine .83. Limitations: Patient is SOB, unable to hold breath for short increments. Comparison Study: None Performing Technologist: Alecia Mackin RVT, RDCS (AE), RDMS  Examination Guidelines: A complete evaluation includes B-mode imaging, spectral Doppler, color Doppler, and power Doppler as needed of all accessible portions of each vessel. Bilateral testing is considered an integral part of a complete examination. Limited examinations for reoccurring indications may be performed as noted.  Duplex Findings: +----------------------+--------+--------+------+--------+ Mesenteric            PSV cm/sEDV cm/sPlaqueComments +----------------------+--------+--------+------+--------+ Aorta Prox               93      93                  +----------------------+--------+--------+------+--------+ Aorta Distal            115     115                  +----------------------+--------+--------+------+--------+ Celiac Artery Proximal  174      49                  +----------------------+--------+--------+------+--------+ SMA Proximal  198      25                  +----------------------+--------+--------+------+--------+    +------------------+--------+--------+-------+ Right  Renal ArteryPSV cm/sEDV cm/sComment +------------------+--------+--------+-------+ Origin              105      16           +------------------+--------+--------+-------+ Proximal            131      26           +------------------+--------+--------+-------+ Mid                 160      33           +------------------+--------+--------+-------+ Distal              127      31           +------------------+--------+--------+-------+ +-----------------+--------+--------+-------+ Left Renal ArteryPSV cm/sEDV cm/sComment +-----------------+--------+--------+-------+ Origin             105      17           +-----------------+--------+--------+-------+ Proximal           141      28           +-----------------+--------+--------+-------+ Mid                 95      15           +-----------------+--------+--------+-------+ Distal              60      16           +-----------------+--------+--------+-------+  Technologist observations: Right upper pole hypoechoic mass measuring 1.3 x 1.3 cm, most likely representing a simple cyst. +------------+--------+--------+----+-----------+--------+--------+----+ Right KidneyPSV cm/sEDV cm/sRI  Left KidneyPSV cm/sEDV cm/sRI   +------------+--------+--------+----+-----------+--------+--------+----+ Upper Pole  20      6       0.72Upper Pole 32      8       0.75 +------------+--------+--------+----+-----------+--------+--------+----+ Mid         26      7       0.        25      7       0.74 +------------+--------+--------+----+-----------+--------+--------+----+ Lower Pole  21      6       0.73Lower Pole 19      5       0.72 +------------+--------+--------+----+-----------+--------+--------+----+ Hilar       45      9       0.81Hilar      31      7       0.77 +------------+--------+--------+----+-----------+--------+--------+----+  +------------------+--------+------------------+--------+ Right Kidney              Left Kidney                +------------------+--------+------------------+--------+ RAR                       RAR                        +------------------+--------+------------------+--------+ RAR (manual)      1.7     RAR (manual)      1.5      +------------------+--------+------------------+--------+ Cortex  Cortex                     +------------------+--------+------------------+--------+ Cortex thickness  15.00 mmCorex thickness   14.00 mm +------------------+--------+------------------+--------+ Kidney length (cm)11.8    Kidney length (cm)10.2     +------------------+--------+------------------+--------+  Summary: Largest Aortic Diameter: 2.3 cm  Renal:  Right: Cyst(s) noted. RRV flow present. Normal size right kidney.        Abnormal right Resistive Index. Normal cortical thickness of        right kidney. No evidence of right renal artery stenosis. Left:  Normal size of left kidney. Abnormal left Resisitve Index.        Normal cortical thickness of the left kidney. LRV flow        present. No evidence of left renal artery stenosis. Mesenteric: Normal Celiac artery and Superior Mesenteric artery findings.  *See table(s) above for measurements and observations.  Diagnosing physician: Carlyle Dolly MD    Preliminary     Procedures Procedures   Medications Ordered in ED Medications - No data to display  ED Course/ Medical Decision Making/ A&P   {            HEART Score: 4                Medical Decision Making Amount and/or Complexity of Data Reviewed Labs: ordered. Radiology: ordered.  Initial Impression and Ddx 77 year old female who presents to the emergency department with chest tightness and shortness of breath today.  Concern for possible acute on chronic heart failure given that she stopped taking her Lasix about 1 week ago.  She has worsening lower  extremity edema. Patient PMH that increases complexity of ED encounter: Hypertension, obesity, hyperlipidemia, asthma Differential: ACS, PE, pneumothorax, pleural effusion, cardiac tamponade, aortic dissection, COPD exacerbation, pneumonia, asthma exacerbation, congestive heart failure, viral upper respiratory infection, medication side effect, anaphylaxis, etc.    Interpretation of Diagnostics I independent reviewed and interpreted the labs as followed: CBC with stable blood counts, BMP with stable counts, BNP 101.6, troponin x 2 negative  - I independently visualized the following imaging with scope of interpretation limited to determining acute life threatening conditions related to emergency care: Chest x-ray, which revealed no acute findings  Patient Reassessment and Ultimate Disposition/Management 77 year old female who presents to the emergency department with chest tightness and shortness of breath today. Overall she is well-appearing, nonseptic and nontoxic in appearance.  She is hemodynamically stable.  She is hypertensive here.  Has not taken her clonidine today. EKG without ischemia or infarction or arrhythmia.  Troponin x 2 is negative.  Doubt ACS. She clinically appears mildly fluid volume overloaded.  She has bilateral lower extremity edema.  There is no rales or hypoxia on exam.  Chest x-ray without fluid volume overloaded.  Her BNP is only 101.  Do not feel that she is in acute on chronic heart failure at this time. Chest x-ray without pneumothorax, pneumonia, pleural effusion. Symptoms are inconsistent with tamponade, dissection.  She has no history of COPD or asthma to contribute. Symptoms are inconsistent with a viral upper respiratory infection. Ambulated her here without hypoxia. Will have her take her clonidine when she gets home and restart her Lasix which she has not taken in 1 week.  I am suspicious that she is having worsening lower extremity edema and onset of acute on  chronic heart failure.  Will have her restart her Lasix until she is able to follow-up with her PCP on 12/23/2022.  She is agreeable to this plan. Otherwise feel that she is safe for discharge at this time.  Spoke with Dr. Rogene Houston, ED attending who agrees with plan of care.  The patient has been appropriately medically screened and/or stabilized in the ED. I have low suspicion for any other emergent medical condition which would require further screening, evaluation or treatment in the ED or require inpatient management. At time of discharge the patient is hemodynamically stable and in no acute distress. I have discussed work-up results and diagnosis with patient and answered all questions. Patient is agreeable with discharge plan. We discussed strict return precautions for returning to the emergency department and they verbalized understanding.     Patient management required discussion with the following services or consulting groups:  None  Complexity of Problems Addressed Acute complicated illness or Injury  Additional Data Reviewed and Analyzed Further history obtained from: Past medical history and medications listed in the EMR, Recent PCP notes, and Care Everywhere  Patient Encounter Risk Assessment Consideration of hospitalization  Final Clinical Impression(s) / ED Diagnoses Final diagnoses:  Shortness of breath    Rx / DC Orders ED Discharge Orders     None         Mickie Hillier, PA-C 12/16/22 1949    Fredia Sorrow, MD 12/18/22 2005

## 2022-12-16 NOTE — Discharge Instructions (Addendum)
You were seen in the emergency department today for shortness of breath and chest pain.  Your workup here has been reassuring.  Please take your clonidine when you get home.  Please also restart taking her Lasix.  This may be the cause of some of your shortness of breath.  Please follow-up with your primary care provider in 1 week.  Additionally we spoke about you having some constipation issues.  You can continue taking your Dulcolax.  You also may find docusate sodium (Colace) helpful.  This is a stool softener.  You can find this over-the-counter.  Please return to emergency department for significantly worsening symptoms.

## 2022-12-16 NOTE — ED Notes (Signed)
BEFAST / VAN Negative 

## 2022-12-16 NOTE — ED Triage Notes (Signed)
States was out paying bills and she started having dry mouth & shortness of breath. Have been adjustments to her antihypertensives the past few weeks.

## 2022-12-17 ENCOUNTER — Emergency Department (HOSPITAL_BASED_OUTPATIENT_CLINIC_OR_DEPARTMENT_OTHER): Payer: Medicare Other

## 2022-12-17 ENCOUNTER — Emergency Department (HOSPITAL_BASED_OUTPATIENT_CLINIC_OR_DEPARTMENT_OTHER)
Admission: EM | Admit: 2022-12-17 | Discharge: 2022-12-17 | Disposition: A | Discharge status: Home / Self Care | Payer: Medicare Other | Attending: Emergency Medicine | Admitting: Emergency Medicine

## 2022-12-17 ENCOUNTER — Encounter: Payer: Self-pay | Admitting: Cardiology

## 2022-12-17 ENCOUNTER — Telehealth: Payer: Self-pay | Admitting: Cardiology

## 2022-12-17 ENCOUNTER — Encounter (HOSPITAL_BASED_OUTPATIENT_CLINIC_OR_DEPARTMENT_OTHER): Payer: Self-pay

## 2022-12-17 ENCOUNTER — Other Ambulatory Visit: Payer: Self-pay

## 2022-12-17 DIAGNOSIS — R0602 Shortness of breath: Secondary | ICD-10-CM | POA: Diagnosis present

## 2022-12-17 DIAGNOSIS — Z7982 Long term (current) use of aspirin: Secondary | ICD-10-CM | POA: Diagnosis not present

## 2022-12-17 DIAGNOSIS — I10 Essential (primary) hypertension: Secondary | ICD-10-CM | POA: Insufficient documentation

## 2022-12-17 DIAGNOSIS — Z79899 Other long term (current) drug therapy: Secondary | ICD-10-CM | POA: Diagnosis not present

## 2022-12-17 LAB — CBC
HCT: 30.4 % — ABNORMAL LOW (ref 36.0–46.0)
Hemoglobin: 10.1 g/dL — ABNORMAL LOW (ref 12.0–15.0)
MCH: 29.1 pg (ref 26.0–34.0)
MCHC: 33.2 g/dL (ref 30.0–36.0)
MCV: 87.6 fL (ref 80.0–100.0)
Platelets: 259 10*3/uL (ref 150–400)
RBC: 3.47 MIL/uL — ABNORMAL LOW (ref 3.87–5.11)
RDW: 13.8 % (ref 11.5–15.5)
WBC: 4.4 10*3/uL (ref 4.0–10.5)
nRBC: 0 % (ref 0.0–0.2)

## 2022-12-17 LAB — BASIC METABOLIC PANEL
Anion gap: 9 (ref 5–15)
BUN: 21 mg/dL (ref 8–23)
CO2: 27 mmol/L (ref 22–32)
Calcium: 9.1 mg/dL (ref 8.9–10.3)
Chloride: 95 mmol/L — ABNORMAL LOW (ref 98–111)
Creatinine, Ser: 1.05 mg/dL — ABNORMAL HIGH (ref 0.44–1.00)
GFR, Estimated: 55 mL/min — ABNORMAL LOW (ref >60)
Glucose, Bld: 112 mg/dL — ABNORMAL HIGH (ref 70–99)
Potassium: 3.7 mmol/L (ref 3.5–5.1)
Sodium: 131 mmol/L — ABNORMAL LOW (ref 135–145)

## 2022-12-17 LAB — BRAIN NATRIURETIC PEPTIDE: B Natriuretic Peptide: 92.1 pg/mL (ref 0.0–100.0)

## 2022-12-17 LAB — TROPONIN I (HIGH SENSITIVITY): Troponin I (High Sensitivity): 10 ng/L (ref <18)

## 2022-12-17 MED ORDER — IRBESARTAN 75 MG PO TABS: 150.0000 mg | ORAL_TABLET | Freq: Every day | ORAL | 0 refills | Status: DC

## 2022-12-17 NOTE — Telephone Encounter (Signed)
LVM for pt to call regarding [place] results and message.

## 2022-12-17 NOTE — ED Triage Notes (Addendum)
Pt reports she is allergic to her BP med Clonidine which causes her to feel short of breath. She states she was seen here yesterday for the same complaint and has not been able to contact her doctor to get the medicine changed. She took her Clonidine today and states she feels short of breath again.  She states she came here today because she is afraid of her BP getting too high if she stops taking the medication and is afraid something will happen and she lives at home by herself. Pt is tearful at triage stating she does not know what to do.

## 2022-12-17 NOTE — ED Provider Notes (Signed)
[place] Provider Note   CSN: [id number] Arrival date & time: 12/17/22  1705     History  Chief Complaint  Patient presents with   Shortness of Breath    Jenny Martin is a 77 y.o. female.  Patient was previously seen yesterday, here at [facility] for shortness of breath.  She reports that the clonidine she is taking is causing concern, feels allergic to it. She feels like the clonidine is breaking her out, causing dry mouth, tightness in her chest, dyspnea.  She reports that she was discharged yesterday and instructed to continue with Lasix as well as her clonidine.  She notes that she feels as if she is becoming hyponatremic after taking her Lasix today.  She also does not feel like she should be taking the clonidine as it causes the side effects listed above.  She is coming in today because she is afraid of her blood pressure getting too high if she stops taking the medicine and does not want something to happen as she lives alone by herself and does not want to "stroke out."   Shortness of Breath Associated symptoms: no abdominal pain, no chest pain, no cough, no ear pain, no fever, no rash, no sore throat and no vomiting    Patient has been on multiple medications for her blood pressure in the past including telmisartan and HCTZ, telmisartan alone, valsartan HCTZ, clonidine   Home Medications Prior to Admission medications   Medication Sig Start Date End Date Taking? Authorizing Provider  irbesartan (AVAPRO) 75 MG tablet Take 2 tablets (150 mg total) by mouth daily. 12/17/22  Yes Erskine Emery, MD  albuterol (VENTOLIN HFA) 108 (90 Base) MCG/ACT inhaler Inhale 2 puffs into the lungs every 6 (six) hours as needed for shortness of breath or wheezing. 08/12/21   [provider]  BAYER ASPIRIN 325 MG tablet Take 325 mg by mouth See admin instructions. Take 325 mg by mouth at bedtime three to four nights a week    [provider]  Calcium Citrate-Vitamin D (CITRACAL/VITAMIN D PO) Take 1 tablet by mouth every other day.    [provider]  carvedilol (COREG) 6.25 MG tablet Take 6.25 mg by mouth daily. 12/01/22   [provider]  cloNIDine (CATAPRES) 0.1 MG tablet Take 1 tablet (0.1 mg total) by mouth 2 (two) times daily. 12/07/22   Revankar, Reita Cliche, MD  DULCOLAX 1200 MG/15ML suspension Take 30 mLs by mouth daily as needed for mild constipation.    [provider]  ezetimibe (ZETIA) 10 MG tablet Take 10 mg by mouth daily. 10/31/21   [provider]  ferrous sulfate 325 (65 FE) MG tablet Take 325 mg by mouth every other day.    [provider]  fluticasone (FLONASE) 50 MCG/ACT nasal spray Place 1 spray into both nostrils daily as needed for allergies.    [provider]  furosemide (LASIX) 20 MG tablet Take 0.5 tablets (10 mg total) by mouth 2 (two) times daily. 12/02/22   Charlesetta Shanks, MD  IRON CR PO Take 1 tablet by mouth daily.    [provider]  magnesium oxide (MAG-OX) 400 MG tablet Take 400-800 mg by mouth See admin instructions. Take 400 mg by mouth once a day as needed for deficient magnesium and 800 mg once a day as needed for constipation 12/03/21   [provider]  Multiple Vitamins-Minerals (CENTRUM SILVER 50+WOMEN) TABS Take 1  tablet by mouth daily with breakfast.    [provider]  Olopatadine HCl 0.2 % SOLN Place 1 drop into both eyes daily as needed (dry eyes and allergies).    [provider]  potassium chloride (KLOR-CON) 10 MEQ tablet Take 1 tablet (10 mEq total) by mouth daily. 09/25/22 10/25/22  Barb Merino, MD  telmisartan (MICARDIS) 80 MG tablet Take 1 tablet (80 mg total) by mouth daily. 09/25/22 12/24/22  Barb Merino, MD  triamcinolone cream (KENALOG) 0.1 % Apply 1 application  topically 2 (two) times daily as needed (for atopic dermatitis- affected areas).    [provider]  TYLENOL 8  HOUR ARTHRITIS PAIN 650 MG CR tablet Take 650 mg by mouth every 6 (six) hours as needed for pain.    [provider]  valsartan-hydrochlorothiazide (DIOVAN-HCT) 160-25 MG tablet Take 1 tablet by mouth daily. 11/21/22   [provider]      Allergies    Atorvastatin, Losartan, Micardis hct [telmisartan-hctz], Pravastatin, Statins, Telmisartan, Valsartan-hydrochlorothiazide, and Ibuprofen    Review of Systems   Review of Systems  Constitutional:  Negative for chills and fever.  HENT:  Negative for ear pain and sore throat.        + dry mouth   Eyes:  Negative for pain and visual disturbance.  Respiratory:  Positive for shortness of breath. Negative for cough.   Cardiovascular:  Negative for chest pain and palpitations.  Gastrointestinal:  Negative for abdominal pain and vomiting.  Genitourinary:  Negative for dysuria and hematuria.  Musculoskeletal:  Negative for arthralgias and back pain.  Skin:  Negative for color change and rash.  Neurological:  Negative for seizures and syncope.  All other systems reviewed and are negative.   Physical Exam Updated Vital Signs BP (!) 187/84   Pulse 76   Temp 98 F (36.7 C) (Oral)   Resp 19   Ht 5\' 5"  (1.651 m)   Wt 130.2 kg   SpO2 100%   BMI 47.76 kg/m  Physical Exam Vitals and nursing note reviewed.  Constitutional:      General: She is not in acute distress.    Appearance: She is well-developed.  HENT:     Head: Normocephalic and atraumatic.  Eyes:     Conjunctiva/sclera: Conjunctivae normal.  Cardiovascular:     Rate and Rhythm: Normal rate and regular rhythm.     Heart sounds: No murmur heard. Pulmonary:     Effort: Pulmonary effort is normal. No respiratory distress.     Breath sounds: Normal breath sounds. No decreased breath sounds, wheezing or rhonchi.  Chest:     Chest wall: No deformity or tenderness.  Abdominal:     Palpations: Abdomen is soft.     Tenderness: There is no abdominal tenderness.   Musculoskeletal:        General: No swelling.     Cervical back: Neck supple.  Skin:    General: Skin is warm and dry.     Capillary Refill: Capillary refill takes less than 2 seconds.  Neurological:     Mental Status: She is alert.  Psychiatric:        Mood and Affect: Mood normal.     ED Results / Procedures / Treatments   Labs (all labs ordered are listed, but only abnormal results are displayed) Labs Reviewed  CBC - Abnormal; Notable for the following components:      Result Value   RBC 3.47 (*)    Hemoglobin 10.1 (*)  HCT 30.4 (*)    All other components within normal limits  BASIC METABOLIC PANEL - Abnormal; Notable for the following components:   Sodium 131 (*)    Chloride 95 (*)    Glucose, Bld 112 (*)    Creatinine, Ser 1.05 (*)    GFR, Estimated 55 (*)    All other components within normal limits  BRAIN NATRIURETIC PEPTIDE  TROPONIN I (HIGH SENSITIVITY)    EKG None  Radiology DG Chest 2 View  Result Date: 12/17/2022 CLINICAL DATA:  Shortness of breath, evaluate for pleural effusion. EXAM: CHEST - 2 VIEW COMPARISON:  Chest x-ray 12/16/2022 FINDINGS: The heart size and mediastinal contours are within normal limits. There is stable elevation of the left hemidiaphragm. There is no pleural effusion or pneumothorax. Both lungs are clear. The visualized skeletal structures are unremarkable. IMPRESSION: No active cardiopulmonary disease. Stable elevation of the left hemidiaphragm. Electronically Signed   By: Ronney Asters M.D.   On: 12/17/2022 18:11   DG Chest 2 View  Result Date: 12/16/2022 CLINICAL DATA:  Shortness of breath. EXAM: CHEST - 2 VIEW COMPARISON:  12/02/2022 FINDINGS: The cardiac silhouette, mediastinal and hilar contours are within normal limits and stable. 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 No pulmonary infiltrates or pleural effusions. No pneumothorax. The bony thorax is intact.  IMPRESSION: 1. No acute cardiopulmonary findings. 2. 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 Electronically Signed   By: Marijo Sanes M.D.   On: 12/16/2022 11:24    Procedures Procedures    Medications Ordered in ED Medications - No data to display  ED Course/ Medical Decision Making/ A&P             HEART Score: 4                Medical Decision Making Patient expresses concern about her medication regimen and is concerned about not taking one of her antihypertensives as her blood pressure is very difficult to control.  For her dyspnea, it does not appear that she has any hypervolemia on physical exam or chest x-ray.  She is maintaining oxygen saturation on room air.  It does not appear that she needs her Lasix at this very moment.  Is concerned about persistent hyponatremia if she takes her Lasix.  Differential for dyspnea: Pulmonary embolism, Pneumonia, hypervolemic status, asthma exacerbation, cardiac etiology.  Recently, the patient's EKG is unremarkable without signs of ischemia or infarction.  Chest x-ray shows no signs of pneumonia or hypervolemia without pleural effusion.  Patient has good work of breathing and normal aeration on physical exam without any wheezing.  She has appropriate oxygen saturation on room air.  Patient does have hyponatremia to 131 today (chronically with low sodium), which she feels is related to her diuretics.  Reassuringly, the patient has no evidence of acute pathology as the cause of her symptoms.  It appears that she may have a psychosomatic component to her symptoms as well.  She has had side effects to multiple types of antihypertensives and is only on clonidine currently.  We will discontinue the clonidine as it has been causing her side effects and start irbesartan (patient reports that she had a rxn to telmisartan (rash) but is willing to try irbesartan as she felt valsartan would have been a good medication if it had not been in  combination with HCTZ.  We discussed in depth that there are very few classifications of antihypertensives and she  should discuss weighing risks and benefits of side effects versus the long-term damage from uncontrolled hypertension.  Discussed low-salt diet as well as exercise.  She has an appointment with a new PCP on 12/23/2022.  She will need an updated BMP after starting the ARB at that time.  Discussed return precautions, patient verbalized understanding.   Risk Prescription drug management.          Final Clinical Impression(s) / ED Diagnoses Final diagnoses:  Primary hypertension    Rx / DC Orders ED Discharge Orders          Ordered    irbesartan (AVAPRO) 75 MG tablet  Daily        12/17/22 2246              Alfredo Martinez, MD 12/17/22 2251    Alvira Monday, MD 12/23/22 1656

## 2022-12-17 NOTE — Telephone Encounter (Signed)
Pt c/o medication issue:  1. Name of Medication:   cloNIDine (CATAPRES) 0.1 MG tablet    2. How are you currently taking this medication (dosage and times per day)? Take 1 tablet (0.1 mg total) by mouth 2 (two) times daily.   3. Are you having a reaction (difficulty breathing--STAT)? No  4. What is your medication issue? Pt would like a callback regarding medication sensitivity. Pt states that this was brought to her attention yesterday while at the ED. She would like a callback regarding this matter as well as she'd like for provider to look at test results. Please advise

## 2022-12-17 NOTE — Discharge Instructions (Addendum)
Please take Irbesartan 75 mg (this is the lowest available dose in this medication once a day STOP the clonidine  TAKE lasix only as needed  Follow up with your PCP in 1 week, they will need to grab lab work

## 2022-12-18 ENCOUNTER — Ambulatory Visit (HOSPITAL_COMMUNITY)
Admission: EM | Admit: 2022-12-18 | Discharge: 2022-12-18 | Disposition: A | Discharge status: Home / Self Care | Payer: Medicare Other | Attending: Physician Assistant | Admitting: Physician Assistant

## 2022-12-18 ENCOUNTER — Encounter (HOSPITAL_COMMUNITY): Payer: Self-pay | Admitting: *Deleted

## 2022-12-18 ENCOUNTER — Other Ambulatory Visit: Payer: Self-pay

## 2022-12-18 DIAGNOSIS — I10 Essential (primary) hypertension: Secondary | ICD-10-CM

## 2022-12-18 MED ORDER — BISOPROLOL FUMARATE 5 MG PO TABS: 5.0000 mg | ORAL_TABLET | Freq: Every day | ORAL | 1 refills | Status: DC

## 2022-12-18 NOTE — ED Triage Notes (Signed)
Pt reports ALL reaction to BP med. Pt unable to give name of BP med. Pt has had a rash and hives on face down neck. SHOB , swelling to legs and feet. Pt has an APPT on 12-23-2022. Pt wants something to hold her over.

## 2022-12-18 NOTE — Discharge Instructions (Addendum)
Advised for you to start Zebeta 5mg   (bisoprolol) once daily to help control blood pressure.  Hopefully this new medication will not cause any side effects.  It should help control blood pressure and also reduce nervousness.  This medication does not have a fluid pill component to it.  Advised to follow-up with PCP for ongoing maintenance of blood pressure.

## 2022-12-18 NOTE — ED Provider Notes (Signed)
[place]    CSN: [id number] Arrival date & time: 12/18/22  1245      History   Chief Complaint Chief Complaint  Patient presents with   Shortness of Breath   dry mouth    HPI Jenny Martin is a 77 y.o. female.   77 year old female presents with hypertension and desire for blood pressure medicine.  Patient indicates that she has been placed on several different blood pressure medicines and she indicates that she had side effects for each 1.  She indicates that she has had hives with the clonidine.  She indicates that she is not able to tolerate the Avapro, micardis, Coreg.  She indicates that these medications have also given her side effects of either not to be able to tolerate due to rash, feeling pressure in the chest, itching, feeling uncomfortable.  She also indicates that she is not able to take diuretics with a combined with blood pressure medicine because she believes that she is losing too much sodium.  And she believes that the loss of sodium is what contributes to her being hospitalized.  Patient indicates she does take Lasix intermittently but only when she needs it for fluid retention.  She indicates the Lasix she takes is 10 mg twice a day only for fluid retention.  Patient does indicate that she has an appointment to see PCP on February 9 for evaluation and review of her medications.  Patient does not have any chest pain or shortness of breath and is without nausea or vomiting. Advised patient that we would give a trial of Zebeta 5 mg once daily to see if this will help reduce her blood pressure without giving her any problems or side effects. Patient indicates she does have a murmur which has been present since age 51 due to complications from strep pneumonia.   Shortness of Breath   Past Medical History:  Diagnosis Date   Abnormal TSH 12/16/2015   Allergic rhinitis 12/10/2015   Anemia    Arachnoid cyst    Arcus senilis of both eyes 12/10/2015   Atopic  dermatitis    Blood transfusion without reported diagnosis    Carpal tunnel syndrome    Chronic vulvitis    Cortical age-related cataract of both eyes 77/82/4235   Cyclical neutropenia ([place]) 12/10/2015   Edema 12/10/2015   Gastroesophageal reflux disease without esophagitis 06/08/2022   H/O bladder infections    History of chicken pox    HTN (hypertension), benign 01/03/2022   Hypercholesterolemia    Hyperlipidemia 12/10/2015   Hyperopia with astigmatism and presbyopia, bilateral 12/10/2015   Hypertension    Hypo-osmolality and hyponatremia 01/08/2022   Hypomagnesemia 01/03/2022   Hyponatremia 01/02/2022   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 04/27/2018   Mild intermittent asthma 12/16/2015   Need for prophylactic vaccination with Streptococcus pneumoniae (Pneumococcus) and Influenza vaccines 12/16/2015   Normocytic anemia 01/03/2022   Nuclear sclerotic cataract of both eyes 04/25/2017   Obese    Pneumonia, pneumococcal ([place])    S/P thoracotomy    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   Viral URI 12/27/2013   Vitreous floaters, bilateral 04/25/2017    Patient Active Problem List   Diagnosis Date Noted   Uncontrolled hypertension 12/07/2022   Morbid obesity ([place]) 12/07/2022   Cardiac murmur 12/07/2022   Anemia 12/06/2022   Arachnoid cyst 12/06/2022   Blood transfusion without reported diagnosis 12/06/2022   Carpal tunnel syndrome 12/06/2022   History of chicken pox 12/06/2022  Hypertension 12/06/2022   Obese 12/06/2022   S/P thoracotomy 12/06/2022   Gastroesophageal reflux disease without esophagitis 06/08/2022   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   Hypo-osmolality and hyponatremia 01/08/2022   HTN (hypertension), benign 01/03/2022   Hypomagnesemia 01/03/2022   Normocytic anemia 01/03/2022   Hyponatremia 01/02/2022   Cortical age-related cataract of both eyes 04/27/2018   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 04/27/2018    Vitreous floaters, bilateral 04/25/2017   Nuclear sclerotic cataract of both eyes 04/25/2017   Abnormal TSH 12/16/2015   Mild intermittent asthma 12/16/2015   Need for prophylactic vaccination with Streptococcus pneumoniae (Pneumococcus) and Influenza vaccines 12/16/2015   Allergic rhinitis 12/10/2015   Arcus senilis of both eyes 40/98/1191   Cyclical neutropenia ([place]) 12/10/2015   Edema 12/10/2015   Hyperopia with astigmatism and presbyopia, bilateral 12/10/2015   Hyperlipidemia 12/10/2015   Viral URI 12/27/2013   History of abnormal Pap smear 01/09/2013   H/O bladder infections    Pneumonia, pneumococcal ([place])    Atopic dermatitis    Chronic vulvitis    Hypercholesterolemia     Past Surgical History:  Procedure Laterality Date   COLPOSCOPY  12/2008   normal   WRIST SURGERY      OB History     Gravida  1   Para  1   Term  1   Preterm      AB      Living  1      SAB      IAB      Ectopic      Multiple      Live Births  1            Home Medications    Prior to Admission medications   Medication Sig Start Date End Date Taking? Authorizing Provider  bisoprolol (ZEBETA) 5 MG tablet Take 1 tablet (5 mg total) by mouth daily. 12/18/22  Yes Nyoka Lint, PA-C  albuterol (VENTOLIN HFA) 108 (90 Base) MCG/ACT inhaler Inhale 2 puffs into the lungs every 6 (six) hours as needed for shortness of breath or wheezing. 08/12/21   [provider]  BAYER ASPIRIN 325 MG tablet Take 325 mg by mouth See admin instructions. Take 325 mg by mouth at bedtime three to four nights a week    [provider]  Calcium Citrate-Vitamin D (CITRACAL/VITAMIN D PO) Take 1 tablet by mouth every other day.    [provider]  carvedilol (COREG) 6.25 MG tablet Take 6.25 mg by mouth daily. 12/01/22   [provider]  cloNIDine (CATAPRES) 0.1 MG tablet Take 1 tablet (0.1 mg total) by mouth 2 (two) times daily. 12/07/22   Revankar, Reita Cliche, MD  DULCOLAX 1200  MG/15ML suspension Take 30 mLs by mouth daily as needed for mild constipation.    [provider]  ezetimibe (ZETIA) 10 MG tablet Take 10 mg by mouth daily. 10/31/21   [provider]  ferrous sulfate 325 (65 FE) MG tablet Take 325 mg by mouth every other day.    [provider]  fluticasone (FLONASE) 50 MCG/ACT nasal spray Place 1 spray into both nostrils daily as needed for allergies.    [provider]  furosemide (LASIX) 20 MG tablet Take 0.5 tablets (10 mg total) by mouth 2 (two) times daily. 12/02/22   Charlesetta Shanks, MD  irbesartan (AVAPRO) 75 MG tablet Take 2 tablets (150 mg total) by mouth daily. 12/17/22   Erskine Emery, MD  IRON CR PO  Take 1 tablet by mouth daily.    [provider]  magnesium oxide (MAG-OX) 400 MG tablet Take 400-800 mg by mouth See admin instructions. Take 400 mg by mouth once a day as needed for deficient magnesium and 800 mg once a day as needed for constipation 12/03/21   [provider]  Multiple Vitamins-Minerals (CENTRUM SILVER 50+WOMEN) TABS Take 1 tablet by mouth daily with breakfast.    [provider]  Olopatadine HCl 0.2 % SOLN Place 1 drop into both eyes daily as needed (dry eyes and allergies).    [provider]  potassium chloride (KLOR-CON) 10 MEQ tablet Take 1 tablet (10 mEq total) by mouth daily. 09/25/22 10/25/22  Barb Merino, MD  telmisartan (MICARDIS) 80 MG tablet Take 1 tablet (80 mg total) by mouth daily. 09/25/22 12/24/22  Barb Merino, MD  triamcinolone cream (KENALOG) 0.1 % Apply 1 application  topically 2 (two) times daily as needed (for atopic dermatitis- affected areas).    [provider]  TYLENOL 8 HOUR ARTHRITIS PAIN 650 MG CR tablet Take 650 mg by mouth every 6 (six) hours as needed for pain.    [provider]  valsartan-hydrochlorothiazide (DIOVAN-HCT) 160-25 MG tablet Take 1 tablet by mouth daily. 11/21/22   [provider]    Family  History Family History  Problem Relation Age of Onset   COPD Mother    Stroke Father     Social History Social History   Tobacco Use   Smoking status: Never   Smokeless tobacco: Never  Vaping Use   Vaping Use: Never used  Substance Use Topics   Alcohol use: No   Drug use: No     Allergies   Atorvastatin, Losartan, Micardis hct [telmisartan-hctz], Pravastatin, Statins, Telmisartan, Valsartan-hydrochlorothiazide, and Ibuprofen   Review of Systems Review of Systems  Respiratory:  Positive for shortness of breath.      Physical Exam Triage Vital Signs ED Triage Vitals  Enc Vitals Group     BP 12/18/22 1539 (!) 187/81     Pulse Rate 12/18/22 1539 79     Resp 12/18/22 1539 18     Temp 12/18/22 1539 98.7 F (37.1 C)     Temp src --      SpO2 12/18/22 1539 95 %     Weight --      Height --      Head Circumference --      Peak Flow --      Pain Score 12/18/22 1537 0     Pain Loc --      Pain Edu? --      Excl. in [place]? --    No data found.  Updated Vital Signs BP (!) 187/81   Pulse 79   Temp 98.7 F (37.1 C)   Resp 18   SpO2 95%   Visual Acuity Right Eye Distance:   Left Eye Distance:   Bilateral Distance:    Right Eye Near:   Left Eye Near:    Bilateral Near:     Physical Exam Constitutional:      Appearance: She is well-developed.  Cardiovascular:     Rate and Rhythm: Normal rate and regular rhythm.     Heart sounds: Murmur (mitral area) heard.  Neurological:     Mental Status: She is alert.      UC Treatments / Results  Labs (all labs ordered are listed, but only abnormal results are displayed) Labs Reviewed - No data to display  EKG   Radiology DG Chest 2 View  Result Date: 12/17/2022 CLINICAL DATA:  Shortness of breath, evaluate for pleural effusion. EXAM: CHEST - 2 VIEW COMPARISON:  Chest x-ray 12/16/2022 FINDINGS: The heart size and mediastinal contours are within normal limits. There is stable elevation of the left  hemidiaphragm. There is no pleural effusion or pneumothorax. Both lungs are clear. The visualized skeletal structures are unremarkable. IMPRESSION: No active cardiopulmonary disease. Stable elevation of the left hemidiaphragm. Electronically Signed   By: Darliss Cheney M.D.   On: 12/17/2022 18:11    Procedures Procedures (including critical care time)  Medications Ordered in UC Medications - No data to display  Initial Impression / Assessment and Plan / UC Course  I have reviewed the triage vital signs and the nursing notes.  Pertinent labs & imaging results that were available during my care of the patient were reviewed by me and considered in my medical decision making (see chart for details).    Plan: The diagnosis be treated with the following: 1.  Essential hypertension: A.  Advised trial of Bisoprolol 5mg  daily 2.  Advised to follow-up with PCP and return to urgent care as needed.  Patient advised to continue her other medications as directed. Final Clinical Impressions(s) / UC Diagnoses   Final diagnoses:  Essential hypertension     Discharge Instructions      Advised for you to start Zebeta 5mg   (bisoprolol) once daily to help control blood pressure.  Hopefully this new medication will not cause any side effects.  It should help control blood pressure and also reduce nervousness.  This medication does not have a fluid pill component to it.  Advised to follow-up with PCP for ongoing maintenance of blood pressure.    ED Prescriptions     Medication Sig Dispense Auth. Provider   bisoprolol (ZEBETA) 5 MG tablet Take 1 tablet (5 mg total) by mouth daily. 30 tablet , PA-C      PDMP not reviewed this encounter.   , PA-C 12/18/22 [phone]

## 2022-12-20 ENCOUNTER — Encounter: Payer: Self-pay | Admitting: Cardiology

## 2022-12-20 ENCOUNTER — Encounter (HOSPITAL_BASED_OUTPATIENT_CLINIC_OR_DEPARTMENT_OTHER): Payer: Self-pay | Admitting: Emergency Medicine

## 2022-12-20 ENCOUNTER — Telehealth: Payer: Self-pay

## 2022-12-20 ENCOUNTER — Emergency Department (HOSPITAL_BASED_OUTPATIENT_CLINIC_OR_DEPARTMENT_OTHER)
Admission: EM | Admit: 2022-12-20 | Discharge: 2022-12-20 | Disposition: A | Discharge status: Home / Self Care | Payer: Medicare Other | Attending: Emergency Medicine | Admitting: Emergency Medicine

## 2022-12-20 ENCOUNTER — Other Ambulatory Visit: Payer: Self-pay

## 2022-12-20 DIAGNOSIS — R0789 Other chest pain: Secondary | ICD-10-CM

## 2022-12-20 DIAGNOSIS — I10 Essential (primary) hypertension: Secondary | ICD-10-CM

## 2022-12-20 DIAGNOSIS — Z79899 Other long term (current) drug therapy: Secondary | ICD-10-CM | POA: Insufficient documentation

## 2022-12-20 LAB — CBC
HCT: 30.9 % — ABNORMAL LOW (ref 36.0–46.0)
Hemoglobin: 10.1 g/dL — ABNORMAL LOW (ref 12.0–15.0)
MCH: 28.9 pg (ref 26.0–34.0)
MCHC: 32.7 g/dL (ref 30.0–36.0)
MCV: 88.3 fL (ref 80.0–100.0)
Platelets: 255 10*3/uL (ref 150–400)
RBC: 3.5 MIL/uL — ABNORMAL LOW (ref 3.87–5.11)
RDW: 14.1 % (ref 11.5–15.5)
WBC: 4.8 10*3/uL (ref 4.0–10.5)
nRBC: 0 % (ref 0.0–0.2)

## 2022-12-20 LAB — BASIC METABOLIC PANEL
Anion gap: 10 (ref 5–15)
BUN: 19 mg/dL (ref 8–23)
CO2: 26 mmol/L (ref 22–32)
Calcium: 9 mg/dL (ref 8.9–10.3)
Chloride: 96 mmol/L — ABNORMAL LOW (ref 98–111)
Creatinine, Ser: 0.94 mg/dL (ref 0.44–1.00)
GFR, Estimated: >60 mL/min (ref >60)
Glucose, Bld: 86 mg/dL (ref 70–99)
Potassium: 3.9 mmol/L (ref 3.5–5.1)
Sodium: 132 mmol/L — ABNORMAL LOW (ref 135–145)

## 2022-12-20 LAB — TROPONIN I (HIGH SENSITIVITY): Troponin I (High Sensitivity): 8 ng/L (ref <18)

## 2022-12-20 NOTE — ED Notes (Signed)
ED Provider at bedside. 

## 2022-12-20 NOTE — Discharge Instructions (Addendum)
It was our pleasure to provide your ER care today - we hope that you feel better. Your heart test is good/normal and your other lab tests appear the same as prior.   It appears you have multiple blood pressure meds listed on your medication list, including several prescribed recently - so rather than ED prescribing yet another blood pressure med, we recommend that you contact your doctor/cardiologist in the next 1-2 days to discuss meds, and plan for which med and what dose they want to continue you on moving forward.   Limit salt intake, eat heart healthy meal plan, and follow up closely with your doctor/cardiologist in the coming week.  Return to ER if worse, new symptoms, fevers, recurrent/persistent chest pain, increased trouble breathing, or other concern.

## 2022-12-20 NOTE — ED Triage Notes (Signed)
Recurrent chest tightness , right arm tingling , reports was seen for this multiple times with no relief .  Shortness of breath , dry cough . Negative 4 panel  last week .  Alert and oriented x 4

## 2022-12-20 NOTE — ED Provider Notes (Signed)
[place] [facility] Provider Note   CSN: [id number] Arrival date & time: 12/20/22  1301     History {Add pertinent medical, surgical, social history, OB history to HPI:1} Chief Complaint  Patient presents with   Chest Pain    Jenny Martin is a 77 y.o. female.  Pt with hx htn, presents concerned that her bp has been high and indicates had a mild, constant, chest tightness sensation in past day. No exertional or episodic chest pain. No associated sob, nv or diaphoresis. No orthopnea/pnd. No pleuritic pain. No new leg pain or swelling. Pt indicates took 1/2 of her bp med this AM due to stating she does not tolerate side effects of multiple meds and feels her current blood pressure makes her feel 'bad' with general burning sensation over entire body. Denies any rash or itching. No throat swelling or wheezing. Notes hx multiple intolerances of meds/bp meds and just wants one with 'no side effects'.    The history is provided by the patient and medical records.  Chest Pain Associated symptoms: no abdominal pain, no back pain, no cough, no dizziness, no fever, no headache, no nausea, no numbness, no palpitations, no shortness of breath, no vomiting and no weakness        Home Medications Prior to Admission medications   Medication Sig Start Date End Date Taking? Authorizing Provider  albuterol (VENTOLIN HFA) 108 (90 Base) MCG/ACT inhaler Inhale 2 puffs into the lungs every 6 (six) hours as needed for shortness of breath or wheezing. 08/12/21   [provider]  BAYER ASPIRIN 325 MG tablet Take 325 mg by mouth See admin instructions. Take 325 mg by mouth at bedtime three to four nights a week    [provider]  bisoprolol (ZEBETA) 5 MG tablet Take 1 tablet (5 mg total) by mouth daily. 12/18/22   Ellsworth Lennox, PA-C  Calcium Citrate-Vitamin D (CITRACAL/VITAMIN D PO) Take 1 tablet by mouth every other day.    [provider]   carvedilol (COREG) 6.25 MG tablet Take 6.25 mg by mouth daily. 12/01/22   [provider]  cloNIDine (CATAPRES) 0.1 MG tablet Take 1 tablet (0.1 mg total) by mouth 2 (two) times daily. 12/07/22   Revankar, Aundra Dubin, MD  DULCOLAX 1200 MG/15ML suspension Take 30 mLs by mouth daily as needed for mild constipation.    [provider]  ezetimibe (ZETIA) 10 MG tablet Take 10 mg by mouth daily. 10/31/21   [provider]  ferrous sulfate 325 (65 FE) MG tablet Take 325 mg by mouth every other day.    [provider]  fluticasone (FLONASE) 50 MCG/ACT nasal spray Place 1 spray into both nostrils daily as needed for allergies.    [provider]  furosemide (LASIX) 20 MG tablet Take 0.5 tablets (10 mg total) by mouth 2 (two) times daily. 12/02/22   Arby Barrette, MD  irbesartan (AVAPRO) 75 MG tablet Take 2 tablets (150 mg total) by mouth daily. 12/17/22   Alfredo Martinez, MD  IRON CR PO Take 1 tablet by mouth daily.    [provider]  magnesium oxide (MAG-OX) 400 MG tablet Take 400-800 mg by mouth See admin instructions. Take 400 mg by mouth once a day as needed for deficient magnesium and 800 mg once a day as needed for constipation 12/03/21   [provider]  Multiple Vitamins-Minerals (CENTRUM SILVER 50+WOMEN) TABS Take 1 tablet by mouth daily with breakfast.  [provider]  Olopatadine HCl 0.2 % SOLN Place 1 drop into both eyes daily as needed (dry eyes and allergies).    [provider]  potassium chloride (KLOR-CON) 10 MEQ tablet Take 1 tablet (10 mEq total) by mouth daily. 09/25/22 10/25/22  Barb Merino, MD  telmisartan (MICARDIS) 80 MG tablet Take 1 tablet (80 mg total) by mouth daily. 09/25/22 12/24/22  Barb Merino, MD  triamcinolone cream (KENALOG) 0.1 % Apply 1 application  topically 2 (two) times daily as needed (for atopic dermatitis- affected areas).    [provider]  TYLENOL 8 HOUR ARTHRITIS PAIN 650  MG CR tablet Take 650 mg by mouth every 6 (six) hours as needed for pain.    [provider]  valsartan-hydrochlorothiazide (DIOVAN-HCT) 160-25 MG tablet Take 1 tablet by mouth daily. 11/21/22   [provider]      Allergies    Atorvastatin, Losartan, Micardis hct [telmisartan-hctz], Pravastatin, Statins, Telmisartan, Valsartan-hydrochlorothiazide, and Ibuprofen    Review of Systems   Review of Systems  Constitutional:  Negative for chills and fever.  HENT:  Negative for sore throat.   Eyes:  Negative for redness.  Respiratory:  Negative for cough and shortness of breath.   Cardiovascular:  Positive for chest pain. Negative for palpitations and leg swelling.  Gastrointestinal:  Negative for abdominal pain, nausea and vomiting.  Genitourinary:  Negative for flank pain.  Musculoskeletal:  Negative for back pain and neck pain.  Skin:  Negative for rash.  Neurological:  Negative for dizziness, speech difficulty, weakness, numbness and headaches.  Hematological:  Does not bruise/bleed easily.  Psychiatric/Behavioral:  Negative for confusion.     Physical Exam Updated Vital Signs BP (!) 171/83   Pulse 64   Temp 98.2 F (36.8 C) (Oral)   Resp 17   SpO2 99%  Physical Exam Vitals and nursing note reviewed.  Constitutional:      Appearance: Normal appearance. She is well-developed.  HENT:     Head: Atraumatic.     Nose: Nose normal.     Mouth/Throat:     Mouth: Mucous membranes are moist.  Eyes:     General: No scleral icterus.    Conjunctiva/sclera: Conjunctivae normal.     Pupils: Pupils are equal, round, and reactive to light.  Neck:     Trachea: No tracheal deviation.  Cardiovascular:     Rate and Rhythm: Normal rate and regular rhythm.     Pulses: Normal pulses.     Heart sounds: Normal heart sounds. No murmur heard.    No friction rub. No gallop.  Pulmonary:     Effort: Pulmonary effort is normal. No respiratory distress.     Breath sounds: Normal  breath sounds.  Abdominal:     General: There is no distension.     Palpations: Abdomen is soft.     Tenderness: There is no abdominal tenderness. There is no guarding.  Genitourinary:    Comments: No cva tenderness.  Musculoskeletal:        General: No tenderness.     Cervical back: Normal range of motion and neck supple. No rigidity. No muscular tenderness.     Comments: Chronic symmetric bilateral foot/ankle edema. No unilateral leg pain or new/increased swelling.   Skin:    General: Skin is warm and dry.     Findings: No rash.  Neurological:     Mental Status: She is alert.     Comments: Alert, speech normal. Motor/sens grossly intact bil. Steady  gait.  Psychiatric:        Mood and Affect: Mood normal.     ED Results / Procedures / Treatments   Labs (all labs ordered are listed, but only abnormal results are displayed) Results for orders placed or performed during the hospital encounter of 12/20/22  CBC  Result Value Ref Range   WBC 4.8 4.0 - 10.5 K/uL   RBC 3.50 (L) 3.87 - 5.11 MIL/uL   Hemoglobin 10.1 (L) 12.0 - 15.0 g/dL   HCT 59.9 (L) 35.7 - 01.7 %   MCV 88.3 80.0 - 100.0 fL   MCH 28.9 26.0 - 34.0 pg   MCHC 32.7 30.0 - 36.0 g/dL   RDW 79.3 90.3 - 00.9 %   Platelets 255 150 - 400 K/uL   nRBC 0.0 0.0 - 0.2 %  Basic metabolic panel  Result Value Ref Range   Sodium 132 (L) 135 - 145 mmol/L   Potassium 3.9 3.5 - 5.1 mmol/L   Chloride 96 (L) 98 - 111 mmol/L   CO2 26 22 - 32 mmol/L   Glucose, Bld 86 70 - 99 mg/dL   BUN 19 8 - 23 mg/dL   Creatinine, Ser 2.33 0.44 - 1.00 mg/dL   Calcium 9.0 8.9 - 00.7 mg/dL   GFR, Estimated >62 >26 mL/min   Anion gap 10 5 - 15  Troponin I (High Sensitivity)  Result Value Ref Range   Troponin I (High Sensitivity) 8 <18 ng/L   VAS US RENAL ARTERY DUPLEX  Result Date: 12/19/2022 ABDOMINAL VISCERAL Patient Name:  ZAINEB NOWACZYK  Date of Exam:   12/15/2022 Medical Rec #: [id number]        Accession #:    [id number] Date of Birth:  September 13, 1946         Patient Gender: F Patient Age:   89 years Exam Location:  [facility] Procedure:      VAS US RENAL ARTERY DUPLEX Referring Phys: Rito Ehrlich [facility] -------------------------------------------------------------------------------- Indications: Uncontrolled hypertension. She denies abdominal/flank pain. High Risk Factors: Hypertension, hyperlipidemia, no history of smoking. Other Factors: Recent blood work 12/02/2022 BUN 24 Creatinine .83. Limitations: Patient is SOB, unable to hold breath for short increments. Comparison Study: None Performing Technologist: Alecia Mackin RVT, RDCS (AE), RDMS  Examination Guidelines: A complete evaluation includes B-mode imaging, spectral Doppler, color Doppler, and power Doppler as needed of all accessible portions of each vessel. Bilateral testing is considered an integral part of a complete examination. Limited examinations for reoccurring indications may be performed as noted.  Duplex Findings: +----------------------+--------+--------+------+--------+ Mesenteric            PSV cm/sEDV cm/sPlaqueComments +----------------------+--------+--------+------+--------+ Aorta Prox               93      93                  +----------------------+--------+--------+------+--------+ Aorta Distal            115     115                  +----------------------+--------+--------+------+--------+ Celiac Artery Proximal  174      49                  +----------------------+--------+--------+------+--------+ SMA Proximal            198      25                  +----------------------+--------+--------+------+--------+    +------------------+--------+--------+-------+  Right Renal ArteryPSV cm/sEDV cm/sComment +------------------+--------+--------+-------+ Origin              105      16           +------------------+--------+--------+-------+ Proximal            131      26           +------------------+--------+--------+-------+ Mid                  160      33           +------------------+--------+--------+-------+ Distal              127      31           +------------------+--------+--------+-------+ +-----------------+--------+--------+-------+ Left Renal ArteryPSV cm/sEDV cm/sComment +-----------------+--------+--------+-------+ Origin             105      17           +-----------------+--------+--------+-------+ Proximal           141      28           +-----------------+--------+--------+-------+ Mid                 95      15           +-----------------+--------+--------+-------+ Distal              60      16           +-----------------+--------+--------+-------+  Technologist observations: Right upper pole hypoechoic mass measuring 1.3 x 1.3 cm, most likely representing a simple cyst. +------------+--------+--------+----+-----------+--------+--------+----+ Right KidneyPSV cm/sEDV cm/sRI  Left KidneyPSV cm/sEDV cm/sRI   +------------+--------+--------+----+-----------+--------+--------+----+ Upper Pole  20      6       0.72Upper Pole 32      8       0.75 +------------+--------+--------+----+-----------+--------+--------+----+ Mid         26      7       0.        25      7       0.74 +------------+--------+--------+----+-----------+--------+--------+----+ Lower Pole  21      6       0.73Lower Pole 19      5       0.72 +------------+--------+--------+----+-----------+--------+--------+----+ Hilar       45      9       0.81Hilar      31      7       0.77 +------------+--------+--------+----+-----------+--------+--------+----+ +------------------+--------+------------------+--------+ Right Kidney              Left Kidney                +------------------+--------+------------------+--------+ RAR                       RAR                        +------------------+--------+------------------+--------+ RAR (manual)      1.7     RAR (manual)       1.5      +------------------+--------+------------------+--------+ Cortex                    Cortex                     +------------------+--------+------------------+--------+  Cortex thickness  15.00 mmCorex thickness   14.00 mm +------------------+--------+------------------+--------+ Kidney length (cm)11.8    Kidney length (cm)10.2     +------------------+--------+------------------+--------+  Summary: Largest Aortic Diameter: 2.3 cm  Renal:  Right: Cyst(s) noted. RRV flow present. Normal size right kidney.        Abnormal right Resistive Index. Normal cortical thickness of        right kidney. No evidence of right renal artery stenosis. Left:  Normal size of left kidney. Abnormal left Resisitve Index.        Normal cortical thickness of the left kidney. LRV flow        present. No evidence of left renal artery stenosis. Mesenteric: Normal Celiac artery and Superior Mesenteric artery findings.  *See table(s) above for measurements and observations.  Diagnosing physician: Carlyle Dolly MD  Electronically signed by Carlyle Dolly MD on 12/19/2022 at 8:19:02 PM.    Final    DG Chest 2 View  Result Date: 12/17/2022 CLINICAL DATA:  Shortness of breath, evaluate for pleural effusion. EXAM: CHEST - 2 VIEW COMPARISON:  Chest x-ray 12/16/2022 FINDINGS: The heart size and mediastinal contours are within normal limits. There is stable elevation of the left hemidiaphragm. There is no pleural effusion or pneumothorax. Both lungs are clear. The visualized skeletal structures are unremarkable. IMPRESSION: No active cardiopulmonary disease. Stable elevation of the left hemidiaphragm. Electronically Signed   By: Ronney Asters M.D.   On: 12/17/2022 18:11   DG Chest 2 View  Result Date: 12/16/2022 CLINICAL DATA:  Shortness of breath. EXAM: CHEST - 2 VIEW COMPARISON:  12/02/2022 FINDINGS: The cardiac silhouette, mediastinal and hilar contours are within normal limits and stable. Stable moderate eventration  of the left hemidiaphragm with some overlying vascular crowding and atelectasis. No pulmonary infiltrates or pleural effusions. No pneumothorax. The bony thorax is intact. IMPRESSION: 1. No acute cardiopulmonary findings. 2. 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 Electronically Signed   By: Marijo Sanes M.D.   On: 12/16/2022 11:24   DG Chest 2 View  Result Date: 12/02/2022 CLINICAL DATA:  Chest pain and elevated blood pressure EXAM: CHEST - 2 VIEW COMPARISON:  Chest x-ray November 29, 2022 FINDINGS: The cardiomediastinal silhouette is unchanged in contour. Persistent elevation of the left hemidiaphragm. No focal pulmonary opacity. No pleural effusion or pneumothorax. The visualized upper abdomen is unremarkable. No acute osseous abnormality. IMPRESSION: No acute cardiopulmonary abnormality. Electronically Signed   By: Beryle Flock M.D.   On: 12/02/2022 12:44   DG Chest 2 View  Result Date: 11/29/2022 CLINICAL DATA:  Shortness of breath. Hives. Possible allergic reaction to medication. EXAM: CHEST - 2 VIEW COMPARISON:  Chest radiograph 09/30/2022. FINDINGS: Unchanged elevation of the left hemidiaphragm. Previously described multifocal nodular opacities in the lungs have resolved. No new airspace opacity. Stable mild cardiomegaly and mediastinal contours. No pleural effusion or pneumothorax. Visualized bones and upper abdomen are otherwise unremarkable. IMPRESSION: No acute cardiopulmonary findings. Electronically Signed   By: Emmit Alexanders M.D   On: 11/29/2022 12:32     EKG EKG Interpretation  Date/Time:  Monday December 20 2022 13:10:37 EST Ventricular Rate:  66 PR Interval:  174 QRS Duration: 104 QT Interval:  436 QTC Calculation: 457 R Axis:   16 Text Interpretation: Sinus rhythm LVH with secondary repolarization abnormality Similar to Dec 17 2022 tracing Confirmed by Nanda Quinton [phone]) on 12/20/2022 1:13:45 PM  Radiology No results found.  Procedures Procedures   {Document cardiac monitor, telemetry assessment procedure when appropriate:1}  Medications  Ordered in ED Medications - No data to display  ED Course/ Medical Decision Making/ A&P   {   Click here for ABCD2, HEART and other calculatorsREFRESH Note before signing :1}                          Medical Decision Making Problems Addressed: Atypical chest pain: acute illness or injury with systemic symptoms that poses a threat to life or bodily functions Essential hypertension: chronic illness or injury with exacerbation, progression, or side effects of treatment that poses a threat to life or bodily functions Uncontrolled hypertension: acute illness or injury with systemic symptoms that poses a threat to life or bodily functions  Amount and/or Complexity of Data Reviewed External Data Reviewed: notes. Labs: ordered. Decision-making details documented in ED Course. Radiology: independent interpretation performed. Decision-making details documented in ED Course. ECG/medicine tests: ordered and independent interpretation performed. Decision-making details documented in ED Course.  Risk Prescription drug management. Decision regarding hospitalization.   Iv ns. Continuous pulse ox and cardiac monitoring. Labs ordered/sent.   Differential diagnosis includes ACS, hypertension, uncontrolled htn, etc . Dispo decision including potential need for admission considered - will get labs and reassess.   Reviewed nursing notes and prior charts for additional history. External reports reviewed.  Pt with several bp meds listed on her list - pt indicates currently she is only taking bisoprolol, and took 1/2 dose today. Pt requests new rx for newbp med.  I stressed importance of pcp f/u to discuss meds, plan for meds moving forward. Discussed pt, given multiple bp meds listed and prescribed recently, she needs to contact pcp office re plan for bp med management, as opposed to adding yet another med.    Cardiac monitor: sinus rhythm, rate 66.  Labs reviewed/interpreted by me - after symptoms for past day+, trop normal, felt not c/w acs. Na, hct c/w baseline. Pts main concern appears to be her bp and new rx for same. No current chest pain, discomfort, sob, or increased wob.   Recent Xrays reviewed/interpreted by me - no edema.   Bp 171/83, does not require emergent lowering in ED.  Rec close pcp f/u.  Return precautions provided.    {Document critical care time when appropriate:1} {Document review of labs and clinical decision tools ie heart score, Chads2Vasc2 etc:1}  {Document your independent review of radiology images, and any outside records:1} {Document your discussion with family members, caretakers, and with consultants:1} {Document social determinants of health affecting pt's care:1} {Document your decision making why or why not admission, treatments were needed:1} Final Clinical Impression(s) / ED Diagnoses Final diagnoses:  Essential hypertension  Uncontrolled hypertension  Atypical chest pain    Rx / DC Orders ED Discharge Orders     None

## 2022-12-21 MED ORDER — TRIAMCINOLONE ACETONIDE 0.1 % EX CREA: 1.0000 | TOPICAL_CREAM | Freq: Two times a day (BID) | CUTANEOUS | Status: AC | PRN

## 2022-12-21 MED ORDER — FERROUS SULFATE 325 (65 FE) MG PO TABS: 325.0000 mg | ORAL_TABLET | ORAL | Status: AC

## 2022-12-21 MED ORDER — FUROSEMIDE 20 MG PO TABS: 10.0000 mg | ORAL_TABLET | Freq: Two times a day (BID) | ORAL | 0 refills | Status: DC | PRN

## 2022-12-21 NOTE — Addendum Note (Signed)
Addended by: Truddie Hidden on: 12/21/2022 04:02 PM   Modules accepted: Orders

## 2022-12-21 NOTE — Addendum Note (Signed)
Addended by: Truddie Hidden on: 12/21/2022 05:18 PM   Modules accepted: Orders

## 2022-12-21 NOTE — Addendum Note (Signed)
Addended by: Truddie Hidden on: 12/21/2022 04:30 PM   Modules accepted: Orders

## 2022-12-22 ENCOUNTER — Ambulatory Visit (HOSPITAL_BASED_OUTPATIENT_CLINIC_OR_DEPARTMENT_OTHER)
Admission: RE | Admit: 2022-12-22 | Discharge: 2022-12-22 | Disposition: A | Discharge status: Home / Self Care | Payer: Medicare Other | Source: Ambulatory Visit | Attending: Cardiology | Admitting: Cardiology

## 2022-12-22 DIAGNOSIS — R011 Cardiac murmur, unspecified: Secondary | ICD-10-CM

## 2022-12-22 NOTE — Addendum Note (Signed)
Addended by: Truddie Hidden on: 12/22/2022 02:03 PM   Modules accepted: Orders

## 2022-12-23 ENCOUNTER — Other Ambulatory Visit: Payer: Self-pay | Admitting: Family Medicine

## 2022-12-23 ENCOUNTER — Encounter: Payer: Self-pay | Admitting: Family Medicine

## 2022-12-23 ENCOUNTER — Ambulatory Visit (INDEPENDENT_AMBULATORY_CARE_PROVIDER_SITE_OTHER): Payer: Medicare Other | Admitting: Family Medicine

## 2022-12-23 VITALS — BP 148/63 | HR 75 | Temp 98.0°F | Resp 16 | Ht 65.0 in | Wt 280.0 lb

## 2022-12-23 DIAGNOSIS — I10 Essential (primary) hypertension: Secondary | ICD-10-CM

## 2022-12-23 LAB — ECHOCARDIOGRAM COMPLETE
Area-P 1/2: 3.77 cm2
S' Lateral: 3.3 cm

## 2022-12-23 MED ORDER — AMLODIPINE BESYLATE 2.5 MG PO TABS: 2.5000 mg | ORAL_TABLET | Freq: Every day | ORAL | 1 refills | Status: DC

## 2022-12-23 NOTE — Progress Notes (Signed)
New Patient Office Visit  Subjective    Patient ID: Jenny Martin, female    DOB: [birth year]  Age: 77 y.o. MRN: [id number]  CC:  Chief Complaint  Patient presents with   Establish Care    HPI Jenny Martin presents to establish care. Previous PCP at [facility] retired. Patient would like to discuss her blood pressure concerns.   Hypertension: - Medications: lasix 20 mg BID PRN for edema.  - Compliance: fair - Checking BP at home: yes, high - Denies any SOB, recurrent headaches, CP, vision changes, LE edema, dizziness, palpitations, or medication side effects. - Reports she couldn't tolerate higher dose of lasix of hctz due to hyponatremia. Reports intolerance/allergy to several other antihypertensives.  - She follows with [facility] cardiology (Dr. Geraldo Pitter). She just saw him on 1/32/24 and discussed lifestlye measures. She was started on clonidine 0.1 mg BID. Reports she had a bad reaction - her mouth dried out and she had tingling under right arm along with chest tightness and dyspnea.  - She has a cardiac murmur and bilateral BLE; cardiology order echo to evaluate. Reports she had this done yesterday - results not yet available. [facility] also noticed abdominal bruit and started workup for renal artery stenosis. [place] with no evidence of renal artery stenosis.  - 12/17/22 ED visit for HTN, hyponatremia  - they started her on irbesartan, but she reports she had hives, chest tightness, and dry mouth so she stopped taking. She went to urgent care the next day. They switched her to [facility] and she reports that caused chest tightness and dyspnea as well so she stopped taking it.  - On 2/524 she message cardiology reporting high blood pressures and trouble tolerating all previous antihypertensives. Dr. Geraldo Pitter placed an urgent referral to HTN clinic and recommended she go to the ED based on her high BP readings. The ED did not start new meds given recent trouble with several BP  medications. BMP stable with baseline hyponatremia (132). They recommend she establish with PCP.  - States she has not yet heard about referral to hypertension clinic.  Hyperlipidemia: - medications: Zetia 10 mg daily - compliance: good - medication SEs: none The 10-year ASCVD risk score (Arnett DK, et al., 2019) is: 22.2%   Values used to calculate the score:     Age: 30 years     Sex: Female     Is Non-Hispanic African American: Yes     Diabetic: No     Tobacco smoker: No     Systolic Blood Pressure: 008 mmHg     Is BP treated: Yes     HDL Cholesterol: 77 MG/DL     Total Cholesterol: 213 MG/DL   Asthma/Allergies: - Reports very well controlled. Rarely needs albuterol inhaler.  - Triamcinolone cream as needed for atpic dermatitis   Iron deficiency: - reports history, but fine most recently - she takes OTC ferrous sulfate 325 mg every other day    Other vitamins/supplements include: magnesium 500 mg every other day, daily women's multivitamin. Reports she tries to eat healthy.     Outpatient Encounter Medications as of 12/23/2022  Medication Sig   albuterol (VENTOLIN HFA) 108 (90 Base) MCG/ACT inhaler Inhale 2 puffs into the lungs every 6 (six) hours as needed for shortness of breath or wheezing.   amLODipine (NORVASC) 2.5 MG tablet Take 1 tablet (2.5 mg total) by mouth daily.   aspirin 325 MG tablet Take 325 mg by mouth daily as  needed for mild pain.   ezetimibe (ZETIA) 10 MG tablet Take 10 mg by mouth daily.   ferrous sulfate 325 (65 FE) MG tablet Take 1 tablet (325 mg total) by mouth every other day.   furosemide (LASIX) 20 MG tablet Take 0.5 tablets (10 mg total) by mouth 2 (two) times daily as needed for edema.   Magnesium 500 MG TABS Take 1 tablet by mouth every other day.   Multiple Vitamins-Minerals (CENTRUM MINIS ADULTS 50+) TABS Take 1 tablet by mouth daily in the afternoon.   triamcinolone cream (KENALOG) 0.1 % Apply 1 Application topically 2 (two) times daily as  needed (for atopic dermatitis- affected areas).   No facility-administered encounter medications on file as of 12/23/2022.    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   Edema 12/10/2015   Gastroesophageal reflux disease without esophagitis 06/08/2022   H/O bladder infections    History of chicken pox    HTN (hypertension), benign 01/03/2022   Hypercholesterolemia    Hyperlipidemia 12/10/2015   Hyperopia with astigmatism and presbyopia, bilateral 12/10/2015   Hypertension    Hypo-osmolality and hyponatremia 01/08/2022   Hypomagnesemia 01/03/2022   Hyponatremia 01/02/2022   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 04/27/2018   Mild intermittent asthma 12/16/2015   Need for prophylactic vaccination with Streptococcus pneumoniae (Pneumococcus) and Influenza vaccines 12/16/2015   Normocytic anemia 01/03/2022   Nuclear sclerotic cataract of both eyes 04/25/2017   Obese    Pneumonia, pneumococcal ([place])    S/P thoracotomy    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   Viral URI 12/27/2013   Vitreous floaters, bilateral 04/25/2017    Past Surgical History:  Procedure Laterality Date   COLPOSCOPY  12/2008   normal   WRIST SURGERY      Family History  Problem Relation Age of Onset   COPD Mother    Stroke Father     Social History   Socioeconomic History   Marital status: Divorced    Spouse name: Not on file   Number of children: Not on file   Years of education: Not on file   Highest education level: Not on file  Occupational History   Not on file  Tobacco Use   Smoking status: Never   Smokeless tobacco: Never  Vaping Use    Vaping Use: Never used  Substance and Sexual Activity   Alcohol use: No   Drug use: No   Sexual activity: Never  Other Topics Concern   Not on file  Social History Narrative   Not on file   Social Determinants of Health   Financial Resource Strain: Not on file  Food Insecurity: No Food Insecurity (09/30/2022)   Hunger Vital Sign    Worried About Running Out of Food in the Last Year: Never true    Ran Out of Food in the Last Year: Never true  Transportation Needs: No Transportation Needs (09/30/2022)   PRAPARE - Hydrologist (Medical): No    Lack of Transportation (Non-Medical): No  Physical Activity: Not on file  Stress: Not on file  Social Connections:  Not on file  Intimate Partner Violence: Not At Risk (09/30/2022)   Humiliation, Afraid, Rape, and Kick questionnaire    Fear of Current or Ex-Partner: No    Emotionally Abused: No    Physically Abused: No    Sexually Abused: No    ROS All review of systems negative except what is listed in the HPI      Objective    BP (!) 148/63   Pulse 75   Temp 98 F (36.7 C)   Resp 16   Ht 5\' 5"  (1.651 m)   Wt 280 lb (127 kg)   SpO2 100%   BMI 46.59 kg/m   Physical Exam Vitals reviewed.  Constitutional:      Appearance: Normal appearance. She is obese.  Cardiovascular:     Rate and Rhythm: Normal rate and regular rhythm.     Heart sounds: Murmur heard.  Pulmonary:     Effort: Pulmonary effort is normal.     Breath sounds: Normal breath sounds.  Musculoskeletal:     Right lower leg: Edema present.     Left lower leg: Edema present.  Neurological:     General: No focal deficit present.     Mental Status: She is alert and oriented to person, place, and time. Mental status is at baseline.  Psychiatric:        Mood and Affect: Mood normal.        Behavior: Behavior normal.        Thought Content: Thought content normal.        Judgment: Judgment normal.         Assessment & Plan:    Problem List Items Addressed This Visit       Cardiovascular and Mediastinum   Hypertension - Primary    - Adding low dose amlodipine to see how you tolerate while we wait for you to get in with the hypertension clinic - please schedule with them when they call you. - You do have swelling in your feet so you need to be using your lasix as prescribed and weigh yourself daily. Continue wearing compression socks, and elevate your legs when seated. We will see what the Echo says - cardiology should give you results after they read the test. - Follow-up with me in 2 weeks so we can recheck blood pressure and labs (bmp, lipids). Labs at baseline 3 days ago and no new symptoms.  - Patient aware of signs/symptoms requiring further/urgent evaluation.       Relevant Medications   amLODipine (NORVASC) 2.5 MG tablet    Return in about 2 weeks (around 01/06/2023) for BP f/u, labs.   I spent 40 minutes dedicated to the care of this patient on the date of this encounter to include pre-visit chart review of prior notes and results, face-to-face time with the patient performing a medically appropriate exam, counseling/education regarding hypertension and medications, and post-visit documentation and ordering of meds as indicated.    Terrilyn Saver, NP

## 2022-12-23 NOTE — Assessment & Plan Note (Signed)
-   Adding low dose amlodipine to see how you tolerate while we wait for you to get in with the hypertension clinic - please schedule with them when they call you. - You do have swelling in your feet so you need to be using your lasix as prescribed and weigh yourself daily. Continue wearing compression socks, and elevate your legs when seated. We will see what the Echo says - cardiology should give you results after they read the test. - Follow-up with me in 2 weeks so we can recheck blood pressure and labs (bmp, lipids). Labs at baseline 3 days ago and no new symptoms.  - Patient aware of signs/symptoms requiring further/urgent evaluation.

## 2022-12-23 NOTE — Patient Instructions (Addendum)
Blood pressure and edema: - Adding low dose amlodipine to see how you tolerate while we wait for you to get in with the hypertension clinic - You do have swelling in your feet so you need to be using your lasix as prescribed and weigh yourself daily. We will see what the Echo says.  - Follow-up with me in 2 weeks so we can recheck blood pressure and labs (bmp, lipids)  ---------------------------------------------------   Thank you for choosing [facility] at [facility] for your Primary Care needs. I am excited for the opportunity to partner with you to meet your health care goals. It was a pleasure meeting you today!  Information on diet, exercise, and health maintenance recommendations are listed below. This is information to help you be sure you are on track for optimal health and monitoring.   Please look over this and let us know if you have any questions or if you have completed any of the health maintenance outside of [place] so that we can be sure your records are up to date.  ___________________________________________________________  MyChart:  For all urgent or time sensitive needs we ask that you please call the office to avoid delays. Our number is [phone]. MyChart is not constantly monitored and due to the large volume of messages a day, replies may take up to 72 business hours.  MyChart Policy: MyChart allows for you to see your visit notes, after visit summary, provider recommendations, lab and tests results, make an appointment, request refills, and contact your provider or the office for non-urgent questions or concerns. Providers are seeing patients during normal business hours and do not have built in time to review MyChart messages.  We ask that you allow a minimum of 3 business days for responses to [employer]. For this reason, please do not send urgent requests through [place]. Please call the office at [phone]. New and ongoing  conditions may require a visit. We have virtual and in-person visits available for your convenience.  Complex MyChart concerns may require a visit. Your provider may request you schedule a virtual or in-person visit to ensure we are providing the best care possible. MyChart messages sent after 11:00 AM on Friday will not be received by the provider until Monday morning.    Lab and Test Results: You will receive your lab and test results on MyChart as soon as they are completed and results have been sent by the lab or testing facility. Due to this service, you will receive your results BEFORE your provider.  I review lab and test results each morning prior to seeing patients. Some results require collaboration with other providers to ensure you are receiving the most appropriate care. For this reason, we ask that you please allow a minimum of 3-5 business days from the time that ALL results have been received for your provider to receive and review lab and test results and contact you about these.  Most lab and test result comments from the provider will be sent through [place]. Your provider may recommend changes to the plan of care, follow-up visits, repeat testing, ask questions, or request an office visit to discuss these results. You may reply directly to this message or call the office to provide information for the provider or set up an appointment. In some instances, you will be called with test results and recommendations. Please let us know if this is preferred and we will make note of this in your chart  to provide this for you.    If you have not heard a response to your lab or test results in 5 business days from all results returning to [place], please call the office to let us know. We ask that you please avoid calling prior to this time unless there is an emergent concern. Due to high call volumes, this can delay the resulting process.  After Hours: For all non-emergency after hours needs,  please call the office at [phone] and select the option to reach the on-call  service. On-call services are shared between multiple [place] offices and therefore it will not be possible to speak directly with your provider. On-call providers may provide medical advice and recommendations, but are unable to provide refills for maintenance medications.  For all emergency or urgent medical needs after normal business hours, we recommend that you seek care at the closest Urgent Care or Emergency Department to ensure appropriate treatment in a timely manner.  [facility] has a 24 hour emergency room located on the ground floor for your convenience.   Urgent Concerns During the Business Day Providers are seeing patients from 8AM to [place] with a busy schedule and are most often not able to respond to non-urgent calls until the end of the day or the next business day. If you should have URGENT concerns during the day, please call and speak to the nurse or schedule a same day appointment so that we can address your concern without delay.   Thank you, again, for choosing me as your health care partner. I appreciate your trust and look forward to learning more about you!   Purcell Nails Olevia Bowens, DNP, FNP-C  ___________________________________________________________  Health Maintenance Recommendations Screening Testing Mammogram Every 1-2 years based on history and risk factors Starting at age 77 Pap Smear Ages 21-39 every 3 years Ages 40-65 every 5 years with HPV testing More frequent testing may be required based on results and history Colon Cancer Screening Every 1-10 years based on test performed, risk factors, and history Starting at age 66 Bone Density Screening Every 2-10 years based on history Starting at age 26 for women Recommendations for men differ based on medication usage, history, and risk factors AAA Screening One time ultrasound Men 36-81 years old who have ever  smoked Lung Cancer Screening Low Dose Lung CT every 12 months Age 27-80 years with a 20 pack-year smoking history who still smoke or who have quit within the last 15 years  Screening Labs Routine  Labs: Complete Blood Count (CBC), Complete Metabolic Panel (CMP), Cholesterol (Lipid Panel) Every 6-12 months based on history and medications May be recommended more frequently based on current conditions or previous results Hemoglobin A1c Lab Every 3-12 months based on history and previous results Starting at age 26 or earlier with diagnosis of diabetes, high cholesterol, BMI >26, and/or risk factors Frequent monitoring for patients with diabetes to ensure blood sugar control Thyroid Panel  Every 6 months based on history, symptoms, and risk factors May be repeated more often if on medication HIV One time testing for all patients 32 and older May be repeated more frequently for patients with increased risk factors or exposure Hepatitis C One time testing for all patients 20 and older May be repeated more frequently for patients with increased risk factors or exposure Gonorrhea, Chlamydia Every 12 months for all sexually active persons 13-24 years Additional monitoring may be recommended for those who are considered high risk or who have symptoms  PSA Men 21-66 years old with risk factors Additional screening may be recommended from age 64-69 based on risk factors, symptoms, and history  Vaccine Recommendations Tetanus Booster All adults every 10 years Flu Vaccine All patients 6 months and older every year COVID Vaccine All patients 12 years and older Initial dosing with booster May recommend additional booster based on age and health history HPV Vaccine 2 doses all patients age 25-26 Dosing may be considered for patients over 26 Shingles Vaccine (Shingrix) 2 doses all adults 59 years and older Pneumonia (Pneumovax 61) All adults 64 years and older May recommend earlier dosing  based on health history Pneumonia (Prevnar 28) All adults 58 years and older Dosed 1 year after Pneumovax 23 Pneumonia (Prevnar 13) All adults 39 years and older (adults 02-54 with certain conditions or risk factors) 1 dose  For those who have not received Prevnar 13 vaccine previously   Additional Screening, Testing, and Vaccinations may be recommended on an individualized basis based on family history, health history, risk factors, and/or exposure.  __________________________________________________________  Diet Recommendations for All Patients  I recommend that all patients maintain a diet low in saturated fats, carbohydrates, and cholesterol. While this can be challenging at first, it is not impossible and small changes can make big differences.  Things to try: Decreasing the amount of soda, sweet tea, and/or juice to one or less per day and replace with water While water is always the first choice, if you do not like water you may consider adding a water additive without sugar to improve the taste other sugar free drinks Replace potatoes with a brightly colored vegetable  Use healthy oils, such as canola oil or olive oil, instead of butter or hard margarine Limit your bread intake to two pieces or less a day Replace regular pasta with low carb pasta options Bake, broil, or grill foods instead of frying Monitor portion sizes  Eat smaller, more frequent meals throughout the day instead of large meals  An important thing to remember is, if you love foods that are not great for your health, you don't have to give them up completely. Instead, allow these foods to be a reward when you have done well. Allowing yourself to still have special treats every once in a while is a nice way to tell yourself thank you for working hard to keep yourself healthy.   Also remember that every day is a new day. If you have a bad day and "fall off the wagon", you can still climb right back up and keep  moving along on your journey!  We have resources available to help you!  Some websites that may be helpful include: www.http://carter.biz/  Www.VeryWellFit.com _____________________________________________________________  Activity Recommendations for All Patients  I recommend that all adults get at least 20 minutes of moderate physical activity that elevates your heart rate at least 5 days out of the week.  Some examples include: Walking or jogging at a pace that allows you to carry on a conversation Cycling (stationary bike or outdoors) Water aerobics Yoga Weight lifting Dancing If physical limitations prevent you from putting stress on your joints, exercise in a pool or seated in a chair are excellent options.  Do determine your MAXIMUM heart rate for activity: 220 - YOUR AGE = MAX Heart Rate   Remember! Do not push yourself too hard.  Start slowly and build up your pace, speed, weight, time in exercise, etc.  Allow your body to rest between exercise and  get good sleep. You will need more water than normal when you are exerting yourself. Do not wait until you are thirsty to drink. Drink with a purpose of getting in at least 8, 8 ounce glasses of water a day plus more depending on how much you exercise and sweat.    If you begin to develop dizziness, chest pain, abdominal pain, jaw pain, shortness of breath, headache, vision changes, lightheadedness, or other concerning symptoms, stop the activity and allow your body to rest. If your symptoms are severe, seek emergency evaluation immediately. If your symptoms are concerning, but not severe, please let us know so that we can recommend further evaluation.

## 2022-12-24 NOTE — Telephone Encounter (Signed)
No need

## 2022-12-26 ENCOUNTER — Encounter: Payer: Self-pay | Admitting: Family Medicine

## 2022-12-27 ENCOUNTER — Other Ambulatory Visit: Payer: Self-pay

## 2022-12-27 ENCOUNTER — Emergency Department (HOSPITAL_COMMUNITY)
Admission: EM | Admit: 2022-12-27 | Discharge: 2022-12-28 | Disposition: A | Discharge status: Home / Self Care | Payer: Medicare Other | Attending: Emergency Medicine | Admitting: Emergency Medicine

## 2022-12-27 ENCOUNTER — Encounter (HOSPITAL_COMMUNITY): Payer: Self-pay

## 2022-12-27 ENCOUNTER — Emergency Department (HOSPITAL_COMMUNITY): Payer: Medicare Other

## 2022-12-27 DIAGNOSIS — Z7982 Long term (current) use of aspirin: Secondary | ICD-10-CM | POA: Insufficient documentation

## 2022-12-27 DIAGNOSIS — U071 COVID-19: Secondary | ICD-10-CM | POA: Diagnosis not present

## 2022-12-27 DIAGNOSIS — Z79899 Other long term (current) drug therapy: Secondary | ICD-10-CM | POA: Diagnosis not present

## 2022-12-27 DIAGNOSIS — R0602 Shortness of breath: Secondary | ICD-10-CM | POA: Diagnosis present

## 2022-12-27 LAB — BASIC METABOLIC PANEL
Anion gap: 12 (ref 5–15)
BUN: 18 mg/dL (ref 8–23)
CO2: 28 mmol/L (ref 22–32)
Calcium: 9.3 mg/dL (ref 8.9–10.3)
Chloride: 94 mmol/L — ABNORMAL LOW (ref 98–111)
Creatinine, Ser: 0.98 mg/dL (ref 0.44–1.00)
GFR, Estimated: >60 mL/min (ref >60)
Glucose, Bld: 98 mg/dL (ref 70–99)
Potassium: 4 mmol/L (ref 3.5–5.1)
Sodium: 134 mmol/L — ABNORMAL LOW (ref 135–145)

## 2022-12-27 LAB — CBC WITH DIFFERENTIAL/PLATELET
Abs Immature Granulocytes: 0.01 10*3/uL (ref 0.00–0.07)
Basophils Absolute: 0 10*3/uL (ref 0.0–0.1)
Basophils Relative: 0 %
Eosinophils Absolute: 0.1 10*3/uL (ref 0.0–0.5)
Eosinophils Relative: 2 %
HCT: 31.7 % — ABNORMAL LOW (ref 36.0–46.0)
Hemoglobin: 10.1 g/dL — ABNORMAL LOW (ref 12.0–15.0)
Immature Granulocytes: 0 %
Lymphocytes Relative: 7 %
Lymphs Abs: 0.3 10*3/uL — ABNORMAL LOW (ref 0.7–4.0)
MCH: 29 pg (ref 26.0–34.0)
MCHC: 31.9 g/dL (ref 30.0–36.0)
MCV: 91.1 fL (ref 80.0–100.0)
Monocytes Absolute: 0.5 10*3/uL (ref 0.1–1.0)
Monocytes Relative: 12 %
Neutro Abs: 3.4 10*3/uL (ref 1.7–7.7)
Neutrophils Relative %: 79 %
Platelets: 207 10*3/uL (ref 150–400)
RBC: 3.48 MIL/uL — ABNORMAL LOW (ref 3.87–5.11)
RDW: 14 % (ref 11.5–15.5)
WBC: 4.3 10*3/uL (ref 4.0–10.5)
nRBC: 0 % (ref 0.0–0.2)

## 2022-12-27 LAB — RESP PANEL BY RT-PCR (RSV, FLU A&B, COVID)  RVPGX2
Influenza A by PCR: NEGATIVE
Influenza B by PCR: NEGATIVE
Resp Syncytial Virus by PCR: NEGATIVE
SARS Coronavirus 2 by RT PCR: POSITIVE — AB

## 2022-12-27 LAB — TROPONIN I (HIGH SENSITIVITY)
Troponin I (High Sensitivity): 9 ng/L (ref <18)
Troponin I (High Sensitivity): 10 ng/L (ref <18)

## 2022-12-27 LAB — BRAIN NATRIURETIC PEPTIDE: B Natriuretic Peptide: 50.2 pg/mL (ref 0.0–100.0)

## 2022-12-27 MED ORDER — PAXLOVID (300/100) 20 X 150 MG & 10 X 100MG PO TBPK: 3.0000 | ORAL_TABLET | Freq: Two times a day (BID) | ORAL | 0 refills | Status: AC

## 2022-12-27 MED ORDER — ACETAMINOPHEN 500 MG PO TABS
1000.0000 mg | ORAL_TABLET | Freq: Once | ORAL | Status: AC
  Administered 2022-12-27: 1000 mg via ORAL
  Filled 2022-12-27: qty 2

## 2022-12-27 NOTE — ED Triage Notes (Signed)
[facility] from home- chest pain started last night with SOB,fatigue, non productive cough, fever and chills.   Alert and oriented x 4  Bp 184/84 Hr 88 Rr 20 O2 98% ra Cbg 90 Temp 99.3

## 2022-12-27 NOTE — Discharge Instructions (Addendum)
You have been give a prescription for Paxlovid to start given your positive COVID test. Be sure to check your blood pressure 2-3 times per day while taking this medication to ensure your blood pressure does not get too low. You can take Tylenol for fever and/or body aches. Use Coricidin HBP for management of other congestion/cough symptoms. Follow up with your primary care doctor to ensure resolution of your symptoms.

## 2022-12-27 NOTE — ED Provider Notes (Signed)
[place] EMERGENCY DEPARTMENT AT [facility] Provider Note   CSN: [id number] Arrival date & time: 12/27/22  1854     History {Add pertinent medical, surgical, social history, OB history to HPI:1} Chief Complaint  Patient presents with   Shortness of Breath   Chest Pain    Jenny Martin is a 77 y.o. female.  77 year old female presents to the emergency department for evaluation of upper respiratory symptoms including cough, chest pain, shortness of breath.  Symptoms began last night with associated subjective fever, fatigue, chills.  She has not taken any over-the-counter remedy for her symptoms. Initially thought it may be her new amlodipine Rx causing her symptoms; however tested positive for COVID in triage tonight. She denies vomiting, diarrhea, syncope.   Hx of frequent ED utilization; seen 8 times in the past month.  The history is provided by the patient. No language interpreter was used.  Shortness of Breath Associated symptoms: chest pain   Chest Pain Associated symptoms: shortness of breath        Home Medications Prior to Admission medications   Medication Sig Start Date End Date Taking? Authorizing Provider  nirmatrelvir & ritonavir (PAXLOVID, 300/100,) 20 x 150 MG & 10 x 100MG TBPK Take 3 tablets by mouth 2 (two) times daily for 5 days. 12/27/22 01/01/23 Yes Antonietta Breach, PA-C  albuterol (VENTOLIN HFA) 108 (90 Base) MCG/ACT inhaler Inhale 2 puffs into the lungs every 6 (six) hours as needed for shortness of breath or wheezing. 08/12/21   [provider]  amLODipine (NORVASC) 2.5 MG tablet TAKE 1 TABLET(2.5 MG) BY MOUTH DAILY 12/23/22   Caleen Jobs B, NP  aspirin 325 MG tablet Take 325 mg by mouth daily as needed for mild pain.    [provider]  ezetimibe (ZETIA) 10 MG tablet Take 10 mg by mouth daily. 10/31/21   [provider]  ferrous sulfate 325 (65 FE) MG tablet Take 1 tablet (325 mg total) by mouth every other day. 12/21/22    Revankar, Reita Cliche, MD  furosemide (LASIX) 20 MG tablet Take 0.5 tablets (10 mg total) by mouth 2 (two) times daily as needed for edema. 12/21/22   Revankar, Reita Cliche, MD  Magnesium 500 MG TABS Take 1 tablet by mouth every other day.    [provider]  Multiple Vitamins-Minerals (CENTRUM MINIS ADULTS 50+) TABS Take 1 tablet by mouth daily in the afternoon.    [provider]  triamcinolone cream (KENALOG) 0.1 % Apply 1 Application topically 2 (two) times daily as needed (for atopic dermatitis- affected areas). 12/21/22   Revankar, Reita Cliche, MD      Allergies    Clonidine derivatives, Atorvastatin, Hydrochlorothiazide, Losartan, Micardis hct [telmisartan-hctz], Pravastatin, Statins, Telmisartan, Valsartan-hydrochlorothiazide, and Ibuprofen    Review of Systems   Review of Systems  Respiratory:  Positive for shortness of breath.   Cardiovascular:  Positive for chest pain.  Ten systems reviewed and are negative for acute change, except as noted in the HPI.    Physical Exam Updated Vital Signs BP (!) 170/78   Pulse 86   Temp 99.6 F (37.6 C)   Resp 18   SpO2 99%  Physical Exam Vitals and nursing note reviewed.  Constitutional:      General: She is not in acute distress.    Appearance: She is well-developed. She is not diaphoretic.     Comments: Obese AA female. Nontoxic appearing.  HENT:     Head: Normocephalic and atraumatic.  Eyes:     General: No scleral icterus.    Conjunctiva/sclera: Conjunctivae normal.  Cardiovascular:     Rate and Rhythm: Normal rate and regular rhythm.     Pulses: Normal pulses.  Pulmonary:     Effort: Pulmonary effort is normal. No respiratory distress.     Breath sounds: No stridor. No wheezing.     Comments: Respirations even and unlabored. Lungs CTAB. Musculoskeletal:        General: Normal range of motion.     Cervical back: Normal range of motion.     Comments: Lower extremity edema; patient reports at baseline  Skin:     General: Skin is warm and dry.     Coloration: Skin is not pale.     Findings: No erythema or rash.  Neurological:     Mental Status: She is alert and oriented to person, place, and time.     Coordination: Coordination normal.  Psychiatric:        Behavior: Behavior normal.     ED Results / Procedures / Treatments   Labs (all labs ordered are listed, but only abnormal results are displayed) Labs Reviewed  RESP PANEL BY RT-PCR (RSV, FLU A&B, COVID)  RVPGX2 - Abnormal; Notable for the following components:      Result Value   SARS Coronavirus 2 by RT PCR POSITIVE (*)    All other components within normal limits  CBC WITH DIFFERENTIAL/PLATELET - Abnormal; Notable for the following components:   RBC 3.48 (*)    Hemoglobin 10.1 (*)    HCT 31.7 (*)    Lymphs Abs 0.3 (*)    All other components within normal limits  BASIC METABOLIC PANEL - Abnormal; Notable for the following components:   Sodium 134 (*)    Chloride 94 (*)    All other components within normal limits  BRAIN NATRIURETIC PEPTIDE  TROPONIN I (HIGH SENSITIVITY)  TROPONIN I (HIGH SENSITIVITY)    EKG None  Radiology DG Chest 1 View  Result Date: 12/27/2022 CLINICAL DATA:  Dyspnea EXAM: CHEST  1 VIEW COMPARISON:  12/17/2022 FINDINGS: Stable elevation of the left hemidiaphragm. Lungs are clear. No pneumothorax or pleural effusion. Stable cardiomegaly. Central pulmonary arteries are stably enlarged in keeping with changes of pulmonary arterial hypertension. No superimposed overt pulmonary edema. No acute bone abnormality. IMPRESSION: 1. Stable cardiomegaly. 2. Stable elevation of the left hemidiaphragm. 3. Stable cardiomegaly and central pulmonary arterial enlargement Electronically Signed   By: Fidela Salisbury M.D.   On: 12/27/2022 19:46    Procedures Procedures  {Document cardiac monitor, telemetry assessment procedure when appropriate:1}  Medications Ordered in ED Medications  acetaminophen (TYLENOL) tablet 1,000 mg  (has no administration in time range)    ED Course/ Medical Decision Making/ A&P   {   Click here for ABCD2, HEART and other calculatorsREFRESH Note before signing :1}                          Medical Decision Making  ***  {Document critical care time when appropriate:1} {Document review of labs and clinical decision tools ie heart score, Chads2Vasc2 etc:1}  {Document your independent review of radiology images, and any outside records:1} {Document your discussion with family members, caretakers, and with consultants:1} {Document social determinants of health affecting pt's care:1} {Document your decision making why or why not admission, treatments were needed:1} Final Clinical Impression(s) / ED Diagnoses Final diagnoses:  COVID-19 virus infection    Rx /  DC Orders ED Discharge Orders          Ordered    nirmatrelvir & ritonavir (PAXLOVID, 300/100,) 20 x 150 MG & 10 x 100MG TBPK  2 times daily        12/27/22 2349

## 2022-12-27 NOTE — ED Provider Triage Note (Signed)
Emergency Medicine Provider Triage Evaluation Note  Jenny Martin , a 77 y.o. female  was evaluated in triage.  Pt complains of chest pain associated with shortness of breath, fatigue, cough, fever, and chills that started last night.  Patient describes chest pain as a burning sensation.  Admits to chronic lower extremity edema.  Review of Systems  Positive: SOB, CP Negative: Abd pain  Physical Exam  BP (!) 167/90 (BP Location: Left Arm)   Pulse 86   Temp 99.5 F (37.5 C) (Oral)   Resp 18   SpO2 99%  Gen:   Awake, no distress   Resp:  Normal effort  MSK:   Moves extremities without difficulty  Other:    Medical Decision Making  Medically screening exam initiated at 7:19 PM.  Appropriate orders placed.  Jenny Martin was informed that the remainder of the evaluation will be completed by another provider, this initial triage assessment does not replace that evaluation, and the importance of remaining in the ED until their evaluation is complete.  Labs CXR EKG RVP   Karie Kirks 12/27/22 1919

## 2022-12-28 MED ORDER — AMLODIPINE BESYLATE 5 MG PO TABS: 2.5000 mg | ORAL_TABLET | Freq: Once | ORAL | Status: DC

## 2022-12-29 ENCOUNTER — Ambulatory Visit (HOSPITAL_BASED_OUTPATIENT_CLINIC_OR_DEPARTMENT_OTHER): Payer: Medicare Other | Admitting: Cardiovascular Disease

## 2023-01-07 ENCOUNTER — Encounter: Payer: Self-pay | Admitting: Family Medicine

## 2023-01-07 ENCOUNTER — Ambulatory Visit (INDEPENDENT_AMBULATORY_CARE_PROVIDER_SITE_OTHER): Payer: Medicare Other | Admitting: Family Medicine

## 2023-01-07 VITALS — BP 145/55 | HR 76 | Temp 98.1°F | Resp 12 | Ht 65.0 in | Wt 278.0 lb

## 2023-01-07 DIAGNOSIS — J0140 Acute pansinusitis, unspecified: Secondary | ICD-10-CM

## 2023-01-07 DIAGNOSIS — I10 Essential (primary) hypertension: Secondary | ICD-10-CM | POA: Diagnosis not present

## 2023-01-07 DIAGNOSIS — N281 Cyst of kidney, acquired: Secondary | ICD-10-CM

## 2023-01-07 HISTORY — DX: Cyst of kidney, acquired: N28.1

## 2023-01-07 MED ORDER — GUAIFENESIN ER 600 MG PO TB12: 1200.0000 mg | ORAL_TABLET | Freq: Two times a day (BID) | ORAL | 0 refills | Status: DC | PRN

## 2023-01-07 MED ORDER — DOXYCYCLINE HYCLATE 100 MG PO TABS: 100.0000 mg | ORAL_TABLET | Freq: Two times a day (BID) | ORAL | 0 refills | Status: AC

## 2023-01-07 NOTE — Assessment & Plan Note (Signed)
Blood pressure is still high, but given all of the meds we have already tried, I would rather let cardiology make any additional changes. Please keep your upcoming appointment with them.  Recommendations:  - BP goal <130/80 - monitor and log blood pressures at home - check around the same time each day in a relaxed setting - Limit salt to <2000 mg/day - Follow DASH eating plan (heart healthy diet) - limit alcohol to 2 standard drinks per day for men and 1 per day for women - avoid tobacco products - get at least 2 hours of regular aerobic exercise weekly Patient aware of signs/symptoms requiring further/urgent evaluation.

## 2023-01-07 NOTE — Patient Instructions (Signed)
Doxycycline added for post-COVID sinus infection with productive cough. Try Mucinex and continue Flonase.  Continue supportive measures including rest, hydration, humidifier use, steam showers, warm compresses to sinuses, warm liquids with lemon and honey, and over-the-counter cough, cold, and analgesics as needed.  Please contact office for follow-up if symptoms do not improve or worsen. Seek emergency care if symptoms become severe.  Blood pressure is still high, but given all of the meds we have already tried, I would rather let cardiology make any additional changes. Please keep your upcoming appointment with them. Focus on a heart healthy diet and exercise as tolerated for now.

## 2023-01-07 NOTE — Progress Notes (Signed)
Established Patient Office Visit  Subjective   Patient ID: Jenny Martin, female    DOB: 02-23-1946  Age: 77 y.o. MRN: [id number]  Chief Complaint  Patient presents with   follow up blood pressure    HPI  Patient is here to follow-up on blood pressure. She has tried multiple medications (losartan, HCTZ, telmisartan, valsartan, irbesartan, clonidine, bisoprolol, in the past and most recently we tried adding amlodipine. For all medications, she reports adverse reactions. Reports amlodipine caused arm pain and a rash. She is following with cardiology (Dr. Geraldo Pitter) and he has [facility] - states she is seeing them in March. Currently, she is only able to tolerate the PRN lasix.  She was diagnosed with COVID on 12/27/22. Reports she still has congestion, tightness, severe sinus pain, headaches, productive cough with white sputum. Patient denies any chest pain, palpitations, dyspnea, wheezing, edema, recurrent headaches, vision changes.       ROS All review of systems negative except what is listed in the HPI    Objective:     BP (!) 145/55   Pulse 76   Temp 98.1 F (36.7 C) (Oral)   Resp 12   Ht '5\' 5"'$  (1.651 m)   Wt 278 lb (126.1 kg)   SpO2 97%   BMI 46.26 kg/m    Physical Exam Vitals reviewed.  Constitutional:      Appearance: Normal appearance. She is obese.  HENT:     Head: Normocephalic and atraumatic.  Cardiovascular:     Rate and Rhythm: Normal rate and regular rhythm.     Heart sounds: Murmur heard.  Pulmonary:     Effort: Pulmonary effort is normal.     Breath sounds: Normal breath sounds.     Comments: Productive cough Musculoskeletal:     Cervical back: Normal range of motion and neck supple.     Right lower leg: Edema present.     Left lower leg: Edema present.  Neurological:     General: No focal deficit present.     Mental Status: She is alert and oriented to person, place, and time. Mental status is at  baseline.  Psychiatric:        Mood and Affect: Mood normal.        Behavior: Behavior normal.        Thought Content: Thought content normal.        Judgment: Judgment normal.      No results found for any visits on 01/07/23.    The 10-year ASCVD risk score (Arnett DK, et al., 2019) is: 21.6%    Assessment & Plan:   Problem List Items Addressed This Visit       Cardiovascular and Mediastinum   HTN (hypertension), benign    Blood pressure is still high, but given all of the meds we have already tried, I would rather let cardiology make any additional changes. Please keep your upcoming appointment with them.  Recommendations:  - BP goal <130/80 - monitor and log blood pressures at home - check around the same time each day in a relaxed setting - Limit salt to <2000 mg/day - Follow DASH eating plan (heart healthy diet) - limit alcohol to 2 standard drinks per day for men and 1 per day for women - avoid tobacco products - get at least 2 hours of regular aerobic exercise weekly Patient aware of signs/symptoms requiring further/urgent evaluation.        Other Visit Diagnoses  Acute non-recurrent pansinusitis    -  Primary Doxycycline added for post-COVID sinus infection with productive cough. Try Mucinex and continue Flonase.  Continue supportive measures including rest, hydration, humidifier use, steam showers, warm compresses to sinuses, warm liquids with lemon and honey, and over-the-counter cough, cold, and analgesics as needed.  Please contact office for follow-up if symptoms do not improve or worsen. Seek emergency care if symptoms become severe.   Relevant Medications   doxycycline (VIBRA-TABS) 100 MG tablet   guaiFENesin (MUCINEX) 600 MG 12 hr tablet       Return in about 6 months (around 07/08/2023) for routine follow-up.    Terrilyn Saver, NP

## 2023-01-11 ENCOUNTER — Encounter: Payer: Self-pay | Admitting: Cardiology

## 2023-01-11 ENCOUNTER — Ambulatory Visit: Payer: Medicare Other | Attending: Cardiology | Admitting: Cardiology

## 2023-01-11 VITALS — BP 154/68 | HR 92 | Ht 65.0 in | Wt 277.1 lb

## 2023-01-11 DIAGNOSIS — I10 Essential (primary) hypertension: Secondary | ICD-10-CM

## 2023-01-11 MED ORDER — AMLODIPINE BESYLATE 2.5 MG PO TABS: 2.5000 mg | ORAL_TABLET | Freq: Two times a day (BID) | ORAL | 3 refills | Status: DC

## 2023-01-11 NOTE — Patient Instructions (Signed)
Medication Instructions:  Your physician has recommended you make the following change in your medication:   Start Amlodipine 2.5 mg twice daily.  *If you need a refill on your cardiac medications before your next appointment, please call your pharmacy*   Lab Work: None ordered If you have labs (blood work) drawn today and your tests are completely normal, you will receive your results only by: [place] (if you have MyChart) OR A paper copy in the mail If you have any lab test that is abnormal or we need to change your treatment, we will call you to review the results.   Testing/Procedures: None ordered   Follow-Up: At [facility], you and your health needs are our priority.  As part of our continuing mission to provide you with exceptional heart care, we have created designated Provider Care Teams.  These Care Teams include your primary Cardiologist (physician) and Advanced Practice Providers (APPs -  Physician Assistants and Nurse Practitioners) who all work together to provide you with the care you need, when you need it.  We recommend signing up for the patient portal called "MyChart".  Sign up information is provided on this After Visit Summary.  MyChart is used to connect with patients for Virtual Visits (Telemedicine).  Patients are able to view lab/test results, encounter notes, upcoming appointments, etc.  Non-urgent messages can be sent to your provider as well.   To learn more about what you can do with MyChart, go to [email or web address].    Your next appointment:   9 month(s)  The format for your next appointment:   In Person  Provider:   Jyl Heinz, MD    Other Instructions none  Important Information About Sugar

## 2023-01-11 NOTE — Progress Notes (Signed)
Cardiology Office Note:    Date:  01/11/2023   ID:  Jenny Martin, DOB 01/02/1946, MRN [id number]  PCP:  Terrilyn Saver, NP  Cardiologist:  Jenean Lindau, MD   Referring MD: Houston Siren., MD    ASSESSMENT:    1. HTN (hypertension), benign   2. Morbid obesity ([place])    PLAN:    In order of problems listed above:  Primary prevention stressed with the patient.  Importance of compliance with diet medication stressed and she vocalized understanding.  She was advised to ambulate to the best of her ability. Essential hypertension: She has stopped taking her medications.  I told her to take amlodipine 2.5 mg twice daily.  She has an appointment coming up with the hypertension clinic on March 7 and they will follow-up with this blood pressure issue. Morbid obesity: Weight reduction stressed and diet was emphasized and she promises to do better. Patient will be seen in follow-up appointment in 6 months or earlier if the patient has any concerns    Medication Adjustments/Labs and Tests Ordered: Current medicines are reviewed at length with the patient today.  Concerns regarding medicines are outlined above.  No orders of the defined types were placed in this encounter.  Meds ordered this encounter  Medications   amLODipine (NORVASC) 2.5 MG tablet    Sig: Take 1 tablet (2.5 mg total) by mouth 2 (two) times daily.    Dispense:  180 tablet    Refill:  3     No chief complaint on file.    History of Present Illness:    Jenny Martin is a 77 y.o. female.  Patient is here for follow-up.  She has history of essential hypertension and morbid obesity.  She leads a sedentary lifestyle.  She recently underwent testing and was positive for COVID.  She denies any chest pain orthopnea or PND.  At the time of my evaluation, the patient is alert awake oriented and in no distress.  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   Edema 12/10/2015   Gastroesophageal reflux disease without esophagitis 06/08/2022   H/O bladder infections    History of chicken pox    HTN (hypertension), benign 01/03/2022   Hypercholesterolemia    Hyperlipidemia 12/10/2015   Hyperopia with astigmatism and presbyopia, bilateral 12/10/2015   Hypertension    Hypo-osmolality and hyponatremia 01/08/2022   Hypomagnesemia 01/03/2022   Hyponatremia 01/02/2022   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 04/27/2018   Mild intermittent asthma 12/16/2015   Morbid obesity ([place]) 12/07/2022   Need for prophylactic vaccination with Streptococcus pneumoniae (Pneumococcus) and Influenza vaccines 12/16/2015   Normocytic anemia 01/03/2022   Nuclear sclerotic cataract of both eyes 04/25/2017   Obese    Pneumonia, pneumococcal ([place])    S/P thoracotomy    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   amLODipine (NORVASC) 2.5 MG tablet Take 1 tablet (2.5 mg total) by mouth 2 (two) times daily.   aspirin 325 MG tablet Take 325 mg by mouth daily as needed for mild pain.   doxycycline (VIBRA-TABS) 100 MG tablet Take 1 tablet (100 mg total) by mouth 2 (two) times daily for 10 days.   ezetimibe (ZETIA) 10 MG tablet Take 10 mg by mouth  daily.   ferrous sulfate 325 (65 FE) MG tablet Take 1 tablet (325 mg total) by mouth every other day.   furosemide (LASIX) 20 MG tablet Take 0.5 tablets (10 mg total) by mouth 2 (two) times daily as needed for edema.   guaiFENesin (MUCINEX) 600 MG 12 hr tablet Take 2 tablets (1,200 mg total) by mouth 2 (two) times daily as needed.   Magnesium 500 MG TABS Take 1 tablet by mouth every other day.   Multiple Vitamins-Minerals (CENTRUM MINIS ADULTS 50+) TABS Take 1 tablet by mouth daily in the afternoon.   triamcinolone cream (KENALOG) 0.1 % Apply 1 Application topically 2 (two) times daily as needed (for atopic dermatitis- affected areas).     Allergies:   Clonidine derivatives, Atorvastatin, Hydrochlorothiazide, Losartan, Micardis hct [telmisartan-hctz], Pravastatin, Statins, Telmisartan, Valsartan-hydrochlorothiazide, and Ibuprofen   Social History   Socioeconomic History   Marital status: Divorced    Spouse name: Not on file   Number of children: Not on file   Years of education: Not on file   Highest education level: Not on file  Occupational History   Not on file  Tobacco Use   Smoking status: Never   Smokeless tobacco: Never  Vaping Use   Vaping Use: Never used  Substance and Sexual Activity   Alcohol use: No   Drug use: No   Sexual activity: Never  Other Topics Concern   Not on file  Social History Narrative   Not on file   Social Determinants of Health   Financial Resource Strain: Not on file  Food Insecurity: No Food Insecurity (09/30/2022)   Hunger Vital Sign    Worried About Running Out of Food in the Last Year: Never true    Ran Out of Food in the Last Year: Never true  Transportation Needs: No Transportation Needs (09/30/2022)   PRAPARE - Hydrologist (Medical): No    Lack of Transportation (Non-Medical): No  Physical Activity: Not on file  Stress: Not on file  Social Connections: Not on file     Family History: The patient's  family history includes COPD in her mother; Stroke in her father.  ROS:   Please see the history of present illness.    All other systems reviewed and are negative.  EKGs/Labs/Other Studies Reviewed:    The following studies were reviewed today: I discussed my findings with the patient at length.   Recent Labs: 09/24/2022: Magnesium 1.8 09/30/2022: ALT 23 12/27/2022: B Natriuretic Peptide 50.2; BUN 18; Creatinine, Ser 0.98; Hemoglobin 10.1; Platelets 207; Potassium 4.0; Sodium 134  Recent Lipid Panel No results found for: "CHOL", "TRIG", "HDL", "CHOLHDL", "VLDL", "LDLCALC", "LDLDIRECT"  Physical Exam:    VS:  BP (!) 154/68   Pulse 92   Ht '5\' 5"'$  (1.651 m)   Wt 277 lb 1.9 oz (125.7 kg)   SpO2  95%   BMI 46.12 kg/m     Wt Readings from Last 3 Encounters:  01/11/23 277 lb 1.9 oz (125.7 kg)  01/07/23 278 lb (126.1 kg)  12/28/22 279 lb (126.6 kg)     GEN: Patient is in no acute distress HEENT: Normal NECK: No JVD; No carotid bruits LYMPHATICS: No lymphadenopathy CARDIAC: Hear sounds regular, 2/6 systolic murmur at the apex. RESPIRATORY:  Clear to auscultation without rales, wheezing or rhonchi  ABDOMEN: Soft, non-tender, non-distended MUSCULOSKELETAL:  No edema; No deformity  SKIN: Warm and dry NEUROLOGIC:  Alert and oriented x 3 PSYCHIATRIC:  Normal affect   Signed, Jenean Lindau, MD  01/11/2023 11:31 AM    [place]

## 2023-01-17 NOTE — Progress Notes (Incomplete)
[facility] Initial Assessment:    Date:  01/17/2023   ID:  Jenny Martin, DOB [birth year], MRN [id number]  PCP:  Terrilyn Saver, NP  Cardiologist:  None  Nephrologist:  Referring MD: Jenean Lindau, MD   CC: Hypertension  History of Present Illness:    Jenny Martin is a 77 y.o. female with a hx of hypertension, *** here to establish care in the [facility].   Today,  *** denies any palpitations, chest pain, shortness of breath, or peripheral edema. No lightheadedness, headaches, syncope, orthopnea, or PND.  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   Edema 12/10/2015   Gastroesophageal reflux disease without esophagitis 06/08/2022   H/O bladder infections    History of chicken pox    HTN (hypertension), benign 01/03/2022   Hypercholesterolemia    Hyperlipidemia 12/10/2015   Hyperopia with astigmatism and presbyopia, bilateral 12/10/2015   Hypertension    Hypo-osmolality and hyponatremia 01/08/2022   Hypomagnesemia 01/03/2022   Hyponatremia 01/02/2022   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 04/27/2018   Mild intermittent asthma 12/16/2015   Morbid obesity ([place]) 12/07/2022   Need for prophylactic vaccination with Streptococcus pneumoniae (Pneumococcus) and Influenza vaccines 12/16/2015   Normocytic anemia 01/03/2022   Nuclear sclerotic cataract of both eyes 04/25/2017   Obese    Pneumonia, pneumococcal ([place])    S/P thoracotomy    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     Allergies:   Clonidine derivatives, Atorvastatin, Hydrochlorothiazide, Losartan, Micardis hct [telmisartan-hctz], Pravastatin, Statins, Telmisartan, Valsartan-hydrochlorothiazide, and Ibuprofen   Social History   Socioeconomic History   Marital status: Divorced    Spouse name: Not on file   Number of children: Not on file   Years of education: Not on file   Highest education level: Not on file  Occupational History   Not on file  Tobacco Use   Smoking status: Never   Smokeless tobacco: Never  Vaping Use   Vaping Use: Never used  Substance and Sexual Activity   Alcohol use: No   Drug use: No   Sexual activity: Never  Other Topics Concern   Not on file  Social History Narrative   Not on file   Social Determinants of Health   Financial Resource Strain: Not on file  Food Insecurity: No Food Insecurity (09/30/2022)   Hunger Vital Sign    Worried About Running Out of Food in the Last Year: Never true    Ran Out of Food in the Last Year: Never true  Transportation Needs: No Transportation Needs (09/30/2022)   PRAPARE - Hydrologist (Medical): No  Lack of Transportation (Non-Medical): No  Physical Activity: Not on file  Stress: Not on file  Social Connections: Not on file     Family History: The patient's family history includes COPD in her mother; Stroke in her father.  ROS:   Please see the history of present illness.     All other systems reviewed and are negative.  EKGs/Labs/Other Studies Reviewed:    ***  EKG:  EKG is personally reviewed. : Sinus ***. Rate *** bpm.  Recent Labs: 09/24/2022:  Magnesium 1.8 09/30/2022: ALT 23 12/27/2022: B Natriuretic Peptide 50.2; BUN 18; Creatinine, Ser 0.98; Hemoglobin 10.1; Platelets 207; Potassium 4.0; Sodium 134   Recent Lipid Panel No results found for: "CHOL", "TRIG", "HDL", "CHOLHDL", "VLDL", "LDLCALC", "LDLDIRECT"  Physical Exam:    VS:  There were no vitals taken for this visit. , BMI There is no height or weight on file to calculate BMI. GENERAL:  Well appearing HEENT: Pupils equal round and reactive, fundi not visualized, oral mucosa unremarkable NECK:  No jugular venous distention, waveform within normal limits, carotid upstroke brisk and symmetric, no bruits, no thyromegaly LYMPHATICS:  No cervical adenopathy LUNGS:  Clear to auscultation bilaterally HEART:  RRR.  PMI not displaced or sustained,S1 and S2 within normal limits, no S3, no S4, no clicks, no rubs, *** murmurs ABD:  Flat, positive bowel sounds normal in frequency in pitch, no bruits, no rebound, no guarding, no midline pulsatile mass, no hepatomegaly, no splenomegaly EXT:  2 plus pulses throughout, no edema, no cyanosis, no clubbing SKIN:  No rashes, no nodules NEURO:  Cranial nerves II through XII grossly intact, motor grossly intact throughout PSYCH:  Cognitively intact, oriented to person place and time   ASSESSMENT/PLAN:    No problem-specific Assessment & Plan notes found for this encounter.   Screening for Secondary Hypertension: { Click here to document screening for secondary causes of HTN  :[id number]    Relevant Labs/Studies:    Latest Ref Rng & Units 12/27/2022    8:01 PM 12/20/2022    3:04 PM 12/17/2022    5:26 PM  Basic Labs  Sodium 135 - 145 mmol/L 134  132  131   Potassium 3.5 - 5.1 mmol/L 4.0  3.9  3.7   Creatinine 0.44 - 1.00 mg/dL 0.98  0.94  1.05                    12/15/2022    9:41 AM  Renovascular   Renal Artery [place] Completed Yes        she consents to be monitored in our remote patient monitoring program through [facility].  she  will track his blood pressure twice daily and understands that these trends will help [place] to adjust her medications as needed prior to his next appointment.  she *** interested in enrolling in the PREP exercise and nutrition program through the [facility].     Disposition:   *** FU with APP/PharmD in 1 month for the next 3 months.   FU with Tiffany C. Oval Linsey, MD, [facility] in 4 months.  Medication Adjustments/Labs and Tests Ordered: Current medicines are reviewed at length with the patient today.  Concerns regarding medicines are outlined above.   No orders of the defined types were placed in this encounter.  No orders of the defined types were placed in this encounter.   I,Mathew Stumpf,acting as a Education administrator for Skeet Latch, MD.,have documented all relevant documentation on the behalf of Skeet Latch, MD,as directed by  Skeet Latch, MD while in the presence of Skeet Latch, MD.  ***  Signed, Madelin Rear  01/17/2023 4:40 PM     [facility]

## 2023-01-19 ENCOUNTER — Encounter (HOSPITAL_BASED_OUTPATIENT_CLINIC_OR_DEPARTMENT_OTHER): Payer: Self-pay | Admitting: *Deleted

## 2023-01-20 ENCOUNTER — Ambulatory Visit (HOSPITAL_BASED_OUTPATIENT_CLINIC_OR_DEPARTMENT_OTHER): Payer: Medicare Other | Admitting: Cardiovascular Disease

## 2023-01-20 ENCOUNTER — Encounter: Payer: Self-pay | Admitting: Family Medicine

## 2023-01-22 ENCOUNTER — Encounter: Payer: Self-pay | Admitting: Cardiology

## 2023-01-24 MED ORDER — FUROSEMIDE 20 MG PO TABS: 10.0000 mg | ORAL_TABLET | Freq: Two times a day (BID) | ORAL | 2 refills | Status: DC | PRN

## 2023-02-02 ENCOUNTER — Encounter (HOSPITAL_BASED_OUTPATIENT_CLINIC_OR_DEPARTMENT_OTHER): Payer: Self-pay | Admitting: Cardiovascular Disease

## 2023-02-02 ENCOUNTER — Ambulatory Visit (INDEPENDENT_AMBULATORY_CARE_PROVIDER_SITE_OTHER): Payer: Medicare Other | Admitting: Cardiovascular Disease

## 2023-02-02 VITALS — BP 170/58 | HR 90 | Ht 65.0 in | Wt 281.9 lb

## 2023-02-02 DIAGNOSIS — I10 Essential (primary) hypertension: Secondary | ICD-10-CM | POA: Diagnosis not present

## 2023-02-02 DIAGNOSIS — E782 Mixed hyperlipidemia: Secondary | ICD-10-CM | POA: Diagnosis not present

## 2023-02-02 DIAGNOSIS — Z5181 Encounter for therapeutic drug level monitoring: Secondary | ICD-10-CM

## 2023-02-02 DIAGNOSIS — Z006 Encounter for examination for normal comparison and control in clinical research program: Secondary | ICD-10-CM

## 2023-02-02 DIAGNOSIS — E7800 Pure hypercholesterolemia, unspecified: Secondary | ICD-10-CM | POA: Diagnosis not present

## 2023-02-02 MED ORDER — SPIRONOLACTONE 25 MG PO TABS: 25.0000 mg | ORAL_TABLET | Freq: Every day | ORAL | 3 refills | Status: DC

## 2023-02-02 NOTE — Patient Instructions (Addendum)
Medication Instructions:  START SPIROLACTONE 25 MG DAILY    Labwork: BMET ON 02/15/2023 OK TO EAT AND DRINK WHATEVER YOU WANT    Testing/Procedures: NONE    Follow-Up: 03/10/2023 8:25 AM WITH Urban Gibson W NP  MYCHART VISIT    Special Instructions:  MONITOR YOUR BLOOD PRESSURE TWICE A DAY (10 AM AND 10 PM), LOG IN THE BOOK PROVIDED. HAVE THE BOOK AVAILABLE AT YOUR FOLLOW UP IN 1 MONTH   DASH Eating Plan DASH stands for "Dietary Approaches to Stop Hypertension." The DASH eating plan is a healthy eating plan that has been shown to reduce high blood pressure (hypertension). It may also reduce your risk for type 2 diabetes, heart disease, and stroke. The DASH eating plan may also help with weight loss. What are tips for following this plan?  General guidelines Avoid eating more than 2,300 mg (milligrams) of salt (sodium) a day. If you have hypertension, you may need to reduce your sodium intake to 1,500 mg a day. Limit alcohol intake to no more than 1 drink a day for nonpregnant women and 2 drinks a day for men. One drink equals 12 oz of beer, 5 oz of wine, or 1 oz of hard liquor. Work with your health care provider to maintain a healthy body weight or to lose weight. Ask what an ideal weight is for you. Get at least 30 minutes of exercise that causes your heart to beat faster (aerobic exercise) most days of the week. Activities may include walking, swimming, or biking. Work with your health care provider or diet and nutrition specialist (dietitian) to adjust your eating plan to your individual calorie needs. Reading food labels  Check food labels for the amount of sodium per serving. Choose foods with less than 5 percent of the Daily Value of sodium. Generally, foods with less than 300 mg of sodium per serving fit into this eating plan. To find whole grains, look for the word "whole" as the first word in the ingredient list. Shopping Buy products labeled as "low-sodium" or "no salt added." Buy  fresh foods. Avoid canned foods and premade or frozen meals. Cooking Avoid adding salt when cooking. Use salt-free seasonings or herbs instead of table salt or sea salt. Check with your health care provider or pharmacist before using salt substitutes. Do not fry foods. Cook foods using healthy methods such as baking, boiling, grilling, and broiling instead. Cook with heart-healthy oils, such as olive, canola, soybean, or sunflower oil. Meal planning Eat a balanced diet that includes: 5 or more servings of fruits and vegetables each day. At each meal, try to fill half of your plate with fruits and vegetables. Up to 6-8 servings of whole grains each day. Less than 6 oz of lean meat, poultry, or fish each day. A 3-oz serving of meat is about the same size as a deck of cards. One egg equals 1 oz. 2 servings of low-fat dairy each day. A serving of nuts, seeds, or beans 5 times each week. Heart-healthy fats. Healthy fats called Omega-3 fatty acids are found in foods such as flaxseeds and coldwater fish, like sardines, salmon, and mackerel. Limit how much you eat of the following: Canned or prepackaged foods. Food that is high in trans fat, such as fried foods. Food that is high in saturated fat, such as fatty meat. Sweets, desserts, sugary drinks, and other foods with added sugar. Full-fat dairy products. Do not salt foods before eating. Try to eat at least 2 vegetarian meals each  week. Eat more home-cooked food and less restaurant, buffet, and fast food. When eating at a restaurant, ask that your food be prepared with less salt or no salt, if possible. What foods are recommended? The items listed may not be a complete list. Talk with your dietitian about what dietary choices are best for you. Grains Whole-grain or whole-wheat bread. Whole-grain or whole-wheat pasta. Brown rice. [place]. Bulgur. Whole-grain and low-sodium cereals. Pita bread. Low-fat, low-sodium crackers. Whole-wheat  flour tortillas. Vegetables Fresh or frozen vegetables (raw, steamed, roasted, or grilled). Low-sodium or reduced-sodium tomato and vegetable juice. Low-sodium or reduced-sodium tomato sauce and tomato paste. Low-sodium or reduced-sodium canned vegetables. Fruits All fresh, dried, or frozen fruit. Canned fruit in natural juice (without added sugar). Meat and other protein foods Skinless chicken or [place]. Ground chicken or [place]. Pork with fat trimmed off. Fish and seafood. Egg whites. Dried beans, peas, or lentils. Unsalted nuts, nut butters, and seeds. Unsalted canned beans. Lean cuts of beef with fat trimmed off. Low-sodium, lean deli meat. Dairy Low-fat (1%) or fat-free (skim) milk. Fat-free, low-fat, or reduced-fat cheeses. Nonfat, low-sodium ricotta or cottage cheese. Low-fat or nonfat yogurt. Low-fat, low-sodium cheese. Fats and oils Soft margarine without trans fats. Vegetable oil. Low-fat, reduced-fat, or light mayonnaise and salad dressings (reduced-sodium). Canola, safflower, olive, soybean, and sunflower oils. Avocado. Seasoning and other foods Herbs. Spices. Seasoning mixes without salt. Unsalted popcorn and pretzels. Fat-free sweets. What foods are not recommended? The items listed may not be a complete list. Talk with your dietitian about what dietary choices are best for you. Grains Baked goods made with fat, such as croissants, muffins, or some breads. Dry pasta or rice meal packs. Vegetables Creamed or fried vegetables. Vegetables in a cheese sauce. Regular canned vegetables (not low-sodium or reduced-sodium). Regular canned tomato sauce and paste (not low-sodium or reduced-sodium). Regular tomato and vegetable juice (not low-sodium or reduced-sodium). Angie Fava. Olives. Fruits Canned fruit in a light or heavy syrup. Fried fruit. Fruit in cream or butter sauce. Meat and other protein foods Fatty cuts of meat. Ribs. Fried meat. Berniece Salines. Sausage. Bologna and other processed lunch  meats. Salami. Fatback. Hotdogs. Bratwurst. Salted nuts and seeds. Canned beans with added salt. Canned or smoked fish. Whole eggs or egg yolks. Chicken or [place] with skin. Dairy Whole or 2% milk, cream, and half-and-half. Whole or full-fat cream cheese. Whole-fat or sweetened yogurt. Full-fat cheese. Nondairy creamers. Whipped toppings. Processed cheese and cheese spreads. Fats and oils Butter. Stick margarine. Lard. Shortening. Ghee. Bacon fat. Tropical oils, such as coconut, palm kernel, or palm oil. Seasoning and other foods Salted popcorn and pretzels. Onion salt, garlic salt, seasoned salt, table salt, and sea salt. Worcestershire sauce. Tartar sauce. Barbecue sauce. Teriyaki sauce. Soy sauce, including reduced-sodium. Steak sauce. Canned and packaged gravies. Fish sauce. Oyster sauce. Cocktail sauce. Horseradish that you find on the shelf. Ketchup. Mustard. Meat flavorings and tenderizers. Bouillon cubes. Hot sauce and Tabasco sauce. Premade or packaged marinades. Premade or packaged taco seasonings. Relishes. Regular salad dressings. Where to find more information: National Heart, Lung, and [place]: https://wilson-eaton.com/ American Heart Association: www.heart.org Summary The DASH eating plan is a healthy eating plan that has been shown to reduce high blood pressure (hypertension). It may also reduce your risk for type 2 diabetes, heart disease, and stroke. With the DASH eating plan, you should limit salt (sodium) intake to 2,300 mg a day. If you have hypertension, you may need to reduce your sodium intake to 1,500 mg a  day. When on the DASH eating plan, aim to eat more fresh fruits and vegetables, whole grains, lean proteins, low-fat dairy, and heart-healthy fats. Work with your health care provider or diet and nutrition specialist (dietitian) to adjust your eating plan to your individual calorie needs. This information is not intended to replace advice given to you by your health care  provider. Make sure you discuss any questions you have with your health care provider. Document Released: 10/21/2011 Document Revised: 10/14/2017 Document Reviewed: 10/25/2016 Elsevier Patient Education  2020 [employer].

## 2023-02-02 NOTE — Research (Signed)
  Subject Name: Jenny Martin met inclusion and exclusion criteria for the Virtual Care and Social Determinant Interventions for the management of hypertension trial.  The informed consent form, study requirements and expectations were reviewed with the subject by Dr. Oval Linsey and myself. The subject was given the opportunity to read the consent and ask questions. The subject verbalized understanding of the trial requirements.  All questions were addressed prior to the signing of the consent form. The subject agreed to participate in the trial and signed the informed consent. The informed consent was obtained prior to performance of any protocol-specific procedures for the subject.  A copy of the signed informed consent was given to the subject and a copy was placed in the subject's medical record.  Jenny Martin was randomized to Group 1.

## 2023-02-02 NOTE — Progress Notes (Signed)
[facility] Initial Assessment:    Date:  02/02/2023   ID:  Jenny Martin, DOB Jan 14, 1946, MRN [id number]  PCP:  Terrilyn Saver, NP  Cardiologist:  None  Nephrologist:  Referring MD: Jenean Lindau, MD   CC: Hypertension  History of Present Illness:    Jenny Martin is a 77 y.o. female with a hx of hypertension, hyperlipidemia, anemia, GERD, asthma, pneumonia, carpal tunnel syndrome, and obesity, here to establish care in the [facility]. On 12/02/2022 she presented to the ED with complaints of chest pain and shortness of breath. She noted that she had taken her first dose of carvedilol the prior evening. She woke up with chest tightness and was hypertensive between [id number] and [id number] systolic. Her blood pressure on arrival was in the 190s but improved to the 140s on its own when brought to the exam room. Repeat troponin was normal. Urinalysis noted microscopic hematuria without infection. Her BP trended up to the 170s; she was given hydralazine 10 mg IV and Lasix 10 mg IV for peripheral edema. She returned to the ED the following day complaining of headaches. She wished to discontinue carvedilol although she was advised that headaches would likely resolve after 1-2 weeks on the medication. She was referred to cardiology for outpatient workup and treatment of her high blood pressure.   She was seen by Dr. Geraldo Pitter 12/07/2022 and her BP was 180/73. Her EKG showed NSR and nonspecific ST-T changes. She was only taking 20 mg furosemide daily. It was noted that she had known intolerance to multiple antihypertensives. She had wheezing with beta-blocker such as carvedilol. With ACE inhibitors she developed swelling and hyponatremia. Clonidine 0.1 mg BID was started. Renal artery dopplers 11/2022 showed no evidence of renal artery stenosis; a cyst was noted in the right upper pole. A murmur was heard on exam; she had an echocardiogram 12/2022 revealing LVEF 60-65%, mild LVH,  and grade 1 diastolic dysfunction. There was mild mitral valve regurgitation and no evidence of valvular stenosis.  On 12/16/22 she presented to the ED with complaints of worsening LE edema, SOB, chest tightness, and tingling sensation of her RUE. She also complained of dry mouth and neck rash from clonidine. She had stopped her Lasix about 1 week prior as it was ineffective. CBC and BMP with stable counts, BNP 101.6, and troponin x2 negative. EKG without ischemia, infarction, or arrhythmia. She was instructed to take her clonidine when arriving home and restart her Lasix. The next day she returned to the ED with similar complaints and was found to have hyponatremia to 131. It was felt her symptoms may have had a psychosomatic component. Her clonidine was discontinued and switched to irbesartan. She reported a prior adverse reaction of rash on telmisartan. She again was seen in the ED the following day with hypertensive concerns and was given a trial of bisoprolol 5 mg. On 12/20/22 in the ED she was hypertensive and requested a new medication, but they recommended follow up with her primary providers given her multiple intolerances. She saw her PCP 12/23/22 and her BP was 148/63. They started amlodipine 2.5 mg. She went back to the ED 12/27/22 with cough and Covid-19. She was hypertensive but no changes were made at that time. She saw her PCP 2/23 and blood pressure remained elevated, but they recommended waiting for cardiology follow-up. She followed up with Dr. Geraldo Pitter 01/11/2023 and her BP was 154/68. She had stopped taking her midcations. She was instructed to  take amlodipine 2.5 mg BID.  Today, she reports struggling with hypertension for at least 8-9 years. Her blood pressure is elevated to 180/78 in clinic today. She has noticed that blood pressures in her right arm are always different than her left. On manual recheck, BP in her right arm is 160/62, and in her left arm it is 170/58. Recently at home her blood  pressure is averaging 140s-150s/70s. She notes that it was previously at a similar level when off of her antihypertensives. In the past, her peak blood pressures were as high as 200s/90s. Currently she is prescribed 2.5 mg amlodipine BID. She has been taking both 2.5 mg doses at once in the mornings, which seems to work better. She complains of LE swelling and arthralgias as side effects of her amlodipine, particularly in her knees and ankles. She is wearing compression socks but she is barely able to tolerate them. Lately she has been taking 1/2 tablets of 20 mg Lasix without improvement in her swelling. She also takes an OTC potassium supplement. For exercise she is walking on the treadmill 5-6 days a week, at least for 20 minutes. Since her Covid-19 infection she has been trying to walk slowly and build up her stamina. Generally she feels a lot better with exercising, no anginal symptoms. When she was 62-39 years old, she had streptococcus pneumonia with left pneumothorax. Subsequently she developed a heart murmur ever since. No personal smoking history and no alcohol consumption. However, she endorses second-hand smoke as her father was a smoker. Sometimes she will order out, usually fish or grilled chicken and salads. When she cooks her meals she is conscientious of trying to avoid too much sodium. Usually she drinks 1 cup of coffee a day, sometimes dandelion tea. Nobody has told her that she snores. Lately she does not feel well rested in the mornings due to leg pain disrupting her sleep. She denies any palpitations, chest pain, shortness of breath, lightheadedness, headaches, syncope, orthopnea, or PND.  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[place]) 12/10/2015   Edema 12/10/2015   Gastroesophageal reflux disease without esophagitis 06/08/2022   H/O bladder infections    History of chicken pox    HTN (hypertension), benign 01/03/2022   Hypercholesterolemia    Hyperlipidemia 12/10/2015   Hyperopia with astigmatism and presbyopia, bilateral 12/10/2015   Hypertension    Hypo-osmolality and hyponatremia 01/08/2022   Hypomagnesemia 01/03/2022   Hyponatremia 01/02/2022   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 04/27/2018   Mild intermittent asthma 12/16/2015   Morbid obesity ([place]) 12/07/2022   Need for prophylactic vaccination with Streptococcus pneumoniae (Pneumococcus) and Influenza vaccines 12/16/2015   Normocytic anemia 01/03/2022   Nuclear sclerotic cataract of both eyes 04/25/2017   Obese    Pneumonia, pneumococcal ([place])    S/P thoracotomy    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   amLODipine (NORVASC) 2.5 MG tablet Take 1 tablet (2.5 mg total) by mouth 2 (two) times daily.   aspirin 325 MG tablet Take 325 mg by mouth daily as needed for mild pain.   ezetimibe (ZETIA) 10 MG tablet Take 10 mg by mouth daily.   ferrous sulfate 325 (65 FE) MG tablet Take 1 tablet (325 mg total) by mouth every other day.   furosemide (LASIX) 20  MG tablet Take 0.5 tablets (10 mg total) by mouth 2 (two) times daily as needed for edema.   guaiFENesin (MUCINEX) 600 MG 12 hr tablet Take 2 tablets (1,200 mg total) by mouth 2 (two) times daily as needed.   Magnesium 500 MG TABS Take 1 tablet by mouth every other day.   Multiple Vitamins-Minerals (CENTRUM MINIS ADULTS 50+) TABS Take 1 tablet by mouth daily in the afternoon.   triamcinolone cream (KENALOG) 0.1 % Apply 1 Application topically 2 (two) times daily as needed (for atopic dermatitis- affected areas).     Allergies:   Clonidine derivatives, Atorvastatin, Hydrochlorothiazide, Losartan, Micardis hct [telmisartan-hctz], Pravastatin, Statins, Telmisartan, Valsartan-hydrochlorothiazide, and Ibuprofen   Social History   Socioeconomic History   Marital status: Divorced    Spouse name: Not on file   Number of children: Not on file   Years of education: Not on file   Highest education level: Not on file  Occupational History   Not on file  Tobacco Use   Smoking status: Never   Smokeless tobacco: Never  Vaping Use   Vaping Use: Never used  Substance and Sexual Activity   Alcohol use: No   Drug use: No   Sexual activity: Never  Other Topics Concern   Not on file  Social History Narrative   Not on file   Social Determinants of Health   Financial Resource Strain: Not on file  Food Insecurity: No Food Insecurity (09/30/2022)   Hunger Vital Sign    Worried About Running Out of Food in the Last Year: Never true    Ran Out of Food in the Last Year: Never true  Transportation Needs: No Transportation Needs (09/30/2022)   PRAPARE - Hydrologist (Medical): No    Lack of Transportation (Non-Medical): No  Physical Activity: Insufficiently Active (02/02/2023)   Exercise Vital Sign    Days of Exercise per Week: 5 days    Minutes of Exercise per Session: 20 min  Stress: Not on file  Social Connections: Not on file     Family History: The patient's  family history includes Asthma in her mother; COPD in her mother; Cancer in her maternal grandmother; Hypertension in her father; Stroke in her father.  ROS:   Please see the history of present illness.    (+) LE swelling (+) Arthralgias All other systems reviewed and are negative.  EKGs/Labs/Other Studies Reviewed:    Echocardiogram 12/22/2022: Sonographer Comments: Patient is obese. Restricted mobility, study  performed in Fowler's position.   IMPRESSIONS   1. GLS -19.4. Left ventricular ejection fraction, by estimation, is 60 to  65%. The left ventricle has normal function. The left ventricle has no  regional wall  motion abnormalities. There is mild left ventricular  hypertrophy. Left ventricular diastolic  parameters are consistent with Grade I diastolic dysfunction (impaired  relaxation).   2. Right ventricular systolic function is normal. The right ventricular  size is normal.   3. The mitral valve is normal in structure. Mild mitral valve  regurgitation. No evidence of mitral stenosis.   4. The aortic valve is normal in structure. Aortic valve regurgitation is  not visualized. No aortic stenosis is present.   5. The inferior vena cava is normal in size with greater than 50%  respiratory variability, suggesting right atrial pressure of 3 mmHg.   Bilateral Renal Artery Dopplers  12/15/2022: Summary:  Largest Aortic Diameter: 2.3 cm    Renal:    Right: Cyst(s) noted. RRV flow present. Normal size right kidney.         Abnormal right Resistive Index. Normal cortical thickness of         right kidney. No evidence of right renal artery stenosis.  Left:  Normal size of left kidney. Abnormal left Resisitve Index.         Normal cortical thickness of the left kidney. LRV flow         present. No evidence of left renal artery stenosis.  Mesenteric:  Normal Celiac artery and Superior Mesenteric artery findings.    EKG:  EKG is personally reviewed. 02/02/2023: EKG was not  ordered.  Recent Labs: 09/24/2022: Magnesium 1.8 09/30/2022: ALT 23 12/27/2022: B Natriuretic Peptide 50.2; BUN 18; Creatinine, Ser 0.98; Hemoglobin 10.1; Platelets 207; Potassium 4.0; Sodium 134   Recent Lipid Panel No results found for: "CHOL", "TRIG", "HDL", "CHOLHDL", "VLDL", "LDLCALC", "LDLDIRECT"  Physical Exam:    VS:  BP (!) 170/58 (BP Location: Left Arm, Patient Position: Sitting, Cuff Size: Large)   Pulse 90   Ht 5\' 5"  (1.651 m)   Wt 281 lb 14.4 oz (127.9 kg)   SpO2 97%   BMI 46.91 kg/m  , BMI Body mass index is 46.91 kg/m. GENERAL:  Well appearing HEENT: Pupils equal round and reactive, fundi not visualized, oral mucosa unremarkable NECK:  No jugular venous distention, waveform within normal limits, carotid upstroke brisk and symmetric, no bruits, no thyromegaly LUNGS:  Clear to auscultation bilaterally HEART:  RRR.  PMI not displaced or sustained,S1 and S2 within normal limits, no S3, no S4, no clicks, no rubs, III/VI systolic murmur at the LUSB ABD:  Flat, positive bowel sounds normal in frequency in pitch, no bruits, no rebound, no guarding, no midline pulsatile mass, no hepatomegaly, no splenomegaly EXT:  2 plus pulses throughout,lymphedema, no cyanosis, no clubbing SKIN:  No rashes, no nodules NEURO:  Cranial nerves II through XII grossly intact, motor grossly intact throughout PSYCH:  Cognitively intact, oriented to person place and time   ASSESSMENT/PLAN:    # Hypertension: BP was asymmetric, but it was inconsistent which arm is higher.  This is not consistent with coarctation.  Maintain current dose of amlodipine. Introduce spironolactone once daily in the morning. Temporarily discontinue potassium supplements.  Check BMP in 2 weeks. Encourage adherence to a low-sodium diet and regular exercise at least 150 minutes weekly.  She consents to it enrolling in our remote patient monitoring study and using the [employer] remote patient monitoring system.  Screening for  Secondary Hypertension:      02/02/2023    9:45 AM  Causes  Drugs/Herbals Screened     - Comments No EtOH.  No NSAIDs.  Tries to limit sodium.  1 coffee daily  Renovascular HTN Screened     - Comments Normal 11/2022  Sleep Apnea N/A     - Comments no snoring  Thyroid Disease Screened     - Comments normal 07/2022  Hyperaldosteronism N/A  Pheochromocytoma N/A  Cushing's Syndrome N/A  Hyperparathyroidism N/A  Coarctation of the Aorta Screened  Compliance Screened     - Comments BP higher R>L    Relevant Labs/Studies:    Latest Ref Rng & Units 12/27/2022    8:01 PM 12/20/2022    3:04 PM 12/17/2022    5:26 PM  Basic Labs  Sodium 135 - 145 mmol/L 134  132  131   Potassium 3.5 - 5.1 mmol/L 4.0  3.9  3.7   Creatinine 0.44 - 1.00 mg/dL 0.98  0.94  1.05                    12/15/2022    9:41 AM  Renovascular   Renal Artery [place] Completed Yes      # Murmur:  # HFpEF:  # Peripheral Edema: - Plan: Advise patient to elevate legs when sitting and increase exercise.  No significant valvular disease on recent echo.  Continue laxix.  BP control as above.  Consider Jardiance/Farxiga.  However will wait given her multiple intolerance.      Screening for Secondary Hypertension:     02/02/2023    9:45 AM  Causes  Drugs/Herbals Screened     - Comments No EtOH.  No NSAIDs.  Tries to limit sodium.  1 coffee daily  Renovascular HTN Screened     - Comments Normal 11/2022  Sleep Apnea N/A     - Comments no snoring  Thyroid Disease Screened     - Comments normal 07/2022  Hyperaldosteronism N/A  Pheochromocytoma N/A  Cushing's Syndrome N/A  Hyperparathyroidism N/A  Coarctation of the Aorta Screened  Compliance Screened     - Comments BP higher R>L    Relevant Labs/Studies:    Latest Ref Rng & Units 12/27/2022    8:01 PM 12/20/2022    3:04 PM 12/17/2022    5:26 PM  Basic Labs  Sodium 135 - 145 mmol/L 134  132  131   Potassium 3.5 - 5.1 mmol/L 4.0  3.9  3.7   Creatinine 0.44 - 1.00  mg/dL 0.98  0.94  1.05                    12/15/2022    9:41 AM  Renovascular   Renal Artery [place] Completed Yes    Disposition:    FU with APP/PharmD in 1 month for the next 3 months.   FU with Kendry Pfarr C. Oval Linsey, MD, [facility] in 4 months.  Medication Adjustments/Labs and Tests Ordered: Current medicines are reviewed at length with the patient today.  Concerns regarding medicines are outlined above.   No orders of the defined types were placed in this encounter.  No orders of the defined types were placed in this encounter.   I,Mathew Stumpf,acting as a Education administrator for Skeet Latch, MD.,have documented all relevant documentation on the behalf of Skeet Latch, MD,as directed by  Skeet Latch, MD while in the presence of Skeet Latch, MD.  I, [place] Oval Linsey, MD have reviewed all documentation for this visit.  The documentation of the exam, diagnosis, procedures, and orders on 02/02/2023 are all accurate and complete.   Signed, Skeet Latch, MD  02/02/2023 10:13 AM    [facility]

## 2023-02-07 ENCOUNTER — Telehealth (HOSPITAL_BASED_OUTPATIENT_CLINIC_OR_DEPARTMENT_OTHER): Payer: Self-pay

## 2023-02-07 NOTE — Telephone Encounter (Signed)
Received the following message via secure chat,   "Good morning Ms. Jenny Martin called this morning about her spironolactone. She stated she was allergic to spironolactone and it was causing her swelling. Pt has stopped taking the medication. I told pt I would reach out to yall. Can you give her a call please [phone]."   Returned call to patient, no answer, left message to call back.

## 2023-02-08 NOTE — Telephone Encounter (Signed)
2nd call attempt, no answer, left message to call back  

## 2023-02-09 ENCOUNTER — Encounter (HOSPITAL_BASED_OUTPATIENT_CLINIC_OR_DEPARTMENT_OTHER): Payer: Self-pay | Admitting: Cardiovascular Disease

## 2023-02-10 NOTE — Telephone Encounter (Signed)
Patient sent mychart message addressing same issues, removing from triage

## 2023-02-10 NOTE — Telephone Encounter (Signed)
Please advise 

## 2023-02-11 MED ORDER — DOXAZOSIN MESYLATE 1 MG PO TABS: 1.0000 mg | ORAL_TABLET | Freq: Every day | ORAL | 6 refills | Status: DC

## 2023-02-11 NOTE — Telephone Encounter (Signed)
We're running low on options for her.  Let's try doxazosin 1 mg nightly at bedtime (stress the at bedtime portion).

## 2023-02-18 LAB — BASIC METABOLIC PANEL
BUN/Creatinine Ratio: 24 (ref 12–28)
BUN: 28 mg/dL — ABNORMAL HIGH (ref 8–27)
CO2: 24 mmol/L (ref 20–29)
Calcium: 9.4 mg/dL (ref 8.7–10.3)
Chloride: 96 mmol/L (ref 96–106)
Creatinine, Ser: 1.15 mg/dL — ABNORMAL HIGH (ref 0.57–1.00)
Glucose: 81 mg/dL (ref 70–99)
Potassium: 4.4 mmol/L (ref 3.5–5.2)
Sodium: 135 mmol/L (ref 134–144)
eGFR: 50 mL/min/{1.73_m2} — ABNORMAL LOW (ref >59)

## 2023-02-20 ENCOUNTER — Other Ambulatory Visit: Payer: Self-pay | Admitting: Student

## 2023-02-21 ENCOUNTER — Other Ambulatory Visit (HOSPITAL_BASED_OUTPATIENT_CLINIC_OR_DEPARTMENT_OTHER): Payer: Self-pay | Admitting: *Deleted

## 2023-02-21 DIAGNOSIS — N289 Disorder of kidney and ureter, unspecified: Secondary | ICD-10-CM

## 2023-02-21 DIAGNOSIS — Z5181 Encounter for therapeutic drug level monitoring: Secondary | ICD-10-CM

## 2023-03-01 ENCOUNTER — Encounter (HOSPITAL_BASED_OUTPATIENT_CLINIC_OR_DEPARTMENT_OTHER): Payer: Self-pay | Admitting: Cardiovascular Disease

## 2023-03-02 MED ORDER — CLONIDINE HCL 0.1 MG PO TABS: ORAL_TABLET | ORAL | 11 refills | Status: DC

## 2023-03-09 LAB — BASIC METABOLIC PANEL
BUN/Creatinine Ratio: 23 (ref 12–28)
BUN: 28 mg/dL — ABNORMAL HIGH (ref 8–27)
CO2: 23 mmol/L (ref 20–29)
Calcium: 9 mg/dL (ref 8.7–10.3)
Chloride: 99 mmol/L (ref 96–106)
Creatinine, Ser: 1.2 mg/dL — ABNORMAL HIGH (ref 0.57–1.00)
Glucose: 77 mg/dL (ref 70–99)
Potassium: 4.2 mmol/L (ref 3.5–5.2)
Sodium: 137 mmol/L (ref 134–144)
eGFR: 47 mL/min/{1.73_m2} — ABNORMAL LOW (ref >59)

## 2023-03-10 ENCOUNTER — Telehealth (INDEPENDENT_AMBULATORY_CARE_PROVIDER_SITE_OTHER): Payer: Medicare Other | Admitting: Family

## 2023-03-10 ENCOUNTER — Encounter (HOSPITAL_BASED_OUTPATIENT_CLINIC_OR_DEPARTMENT_OTHER): Payer: Self-pay | Admitting: Family

## 2023-03-10 VITALS — BP 163/87 | HR 75 | Ht 65.0 in | Wt 274.0 lb

## 2023-03-10 DIAGNOSIS — I10 Essential (primary) hypertension: Secondary | ICD-10-CM | POA: Diagnosis not present

## 2023-03-10 DIAGNOSIS — E782 Mixed hyperlipidemia: Secondary | ICD-10-CM | POA: Diagnosis not present

## 2023-03-10 MED ORDER — AMLODIPINE BESYLATE 2.5 MG PO TABS: ORAL_TABLET | ORAL | 3 refills | Status: DC

## 2023-03-10 MED ORDER — EZETIMIBE 10 MG PO TABS: 10.0000 mg | ORAL_TABLET | Freq: Every day | ORAL | 3 refills | Status: DC

## 2023-03-10 MED ORDER — CLONIDINE HCL 0.1 MG PO TABS: ORAL_TABLET | ORAL | 3 refills | Status: DC

## 2023-03-10 NOTE — Patient Instructions (Signed)
Medication Instructions:  Your physician has recommended you make the following change in your medication:   We have removed doxazosin from your medication list at this time   Increase Amlodipine to  ( two tablets) in the morning and 2.5mg  ( 1 tablet) in the evening  Continue Clonidine 1/2 tablet twice daily    Follow-Up: Follow up as scheduled

## 2023-03-10 NOTE — Progress Notes (Signed)
Advanced Hypertension Follow UP Virtual Visit via Video Note   Because of Jenny Martin's co-morbid illnesses, she is at least at moderate risk for complications without adequate follow up.  This format is felt to be most appropriate for this patient at this time.  All issues noted in this document were discussed and addressed.  A limited physical exam was performed with this format.  Please refer to the patient's chart for her consent to telehealth for [facility].    Date:  03/10/2023   ID:  KAMEREN BAADE, DOB 1946-09-19, MRN [id number] The patient was identified using 2 identifiers.  Patient Location: Home Provider Location: Office/Clinic   PCP:  Clayborne Dana, NP   [facility] Providers Cardiologist:  Chilton Si, MD     Evaluation Performed:  Follow-Up Visit  Chief Complaint:  Hypertension  History of Present Illness:    Jenny Martin is a 77 y.o. female with hypertension, hyperlipidemia, anemia, GERD, asthma, pneumonia, carpal tunnel syndrome, obesity, HFpEF, edema.  Presents today for follow-up with the Advanced Hypertension clinic via video visit.  Established with advanced hypertension clinic 02/02/2023.  She had multiple ED visits earlier in the year with elevated blood pressure. He saw Dr. Tomie China 12/07/2022 with BP 180/73.   It was noted she had intolerance to multiple antihypertensives.  Clonidine was initiated.  Renal artery duplex 11/2022 no renal artery stenosis, cyst noted in the right upper pole.  Echo 12/2022 LVEF 60 to 65%, mild LVH, grade 1 diastolic dysfunction, mild MR.  Initial hypertension clinic blood pressure noted be asymmetric but inconsistent which arm was higher and not consistent with coarctation.  She was recommended continue present dose amlodipine and start spironolactone daily in the morning.  She was enrolled in [facility]ify [facility] today. It was felt some of her symptoms in various ED visits may have psychosomatic component.   She  subsequently contacted the office noting her blood pressure was persistently 160s over 80s despite spironolactone and she reported she was allergic to it with increased swelling in ankles, legs, face rash.  She self discontinued spironolactone.  She was recommended to trial doxazosin 1 mg nightly.Was recommended by pharmacy team to take 1 amlodipine 2.5 mg and half clonidine tablet every morning and repeat in the evening.  Presents today independently for follow-up. She is concerned her medications are not working as her blood pressure is not coming down.  Reiterated that she is on overall low doses of her medications often takes 2-3 medications to control blood pressure. She is presently taking Amlodipine 2.5mg  BID, Clonidine half tablet twice daily.  She never started doxazosin as she was concerned about dizziness after reading the side effect profile.  BP readings at home morning and evening when checked 2 to 3 hours after medications: 160/90 ? 152/76 152/74 ? 151/77 166/84  174/88 ? 160/85   She has ordered some exercise pedals to try to add some physical activity into her routine. She is wearing compression socks which she does not enjoy. Endorses following a low sodium diet. Exertional dyspnea with more than usual activity which is unchanged from previous. Inhaler does help. No chest pain, pressure, tightness.   Clonidine whole tablet twice daily was not manageable as it "felt strong "but half tablet twice daily is manageable.  Reassurance provided that the doxazosin previously prescribed is a low-dose.  However, she prefers to adjust only her current medications.  Previous antihypertensives Beta-blockers, carvedilol-wheezing ACE-swelling, hyponatremia Lasix-hyponatremia Hydrochlorothiazide-hyponatremia Telmisartan-rash Clonidine-neck rash,  dry mouth  Past Medical History:  Diagnosis Date   Abnormal TSH 12/16/2015   Allergic rhinitis 12/10/2015   Anemia    Arachnoid cyst    Arcus  senilis of both eyes 12/10/2015   Atopic dermatitis    Blood transfusion without reported diagnosis    Cardiac murmur 12/07/2022   Carpal tunnel syndrome    Chronic vulvitis    Cortical age-related cataract of both eyes 04/27/2018   Cyclical neutropenia 12/10/2015   Edema 12/10/2015   Gastroesophageal reflux disease without esophagitis 06/08/2022   H/O bladder infections    History of chicken pox    HTN (hypertension), benign 01/03/2022   Hypercholesterolemia    Hyperlipidemia 12/10/2015   Hyperopia with astigmatism and presbyopia, bilateral 12/10/2015   Hypertension    Hypo-osmolality and hyponatremia 01/08/2022   Hypomagnesemia 01/03/2022   Hyponatremia 01/02/2022   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 04/27/2018   Mild intermittent asthma 12/16/2015   Morbid obesity 12/07/2022   Need for prophylactic vaccination with Streptococcus pneumoniae (Pneumococcus) and Influenza vaccines 12/16/2015   Normocytic anemia 01/03/2022   Nuclear sclerotic cataract of both eyes 04/25/2017   Obese    Pneumonia, pneumococcal    S/P thoracotomy    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   aspirin 325 MG tablet Take 325 mg by mouth daily as needed for mild pain.   ferrous sulfate 325 (65 FE) MG tablet Take 1 tablet (325 mg total) by mouth every other day.   Magnesium 500 MG TABS Take 1 tablet by mouth every other day.   Multiple Vitamins-Minerals (CENTRUM MINIS ADULTS 50+) TABS Take 1 tablet by mouth daily in the afternoon.   triamcinolone cream (KENALOG) 0.1 % Apply 1 Application  topically 2 (two) times daily as needed (for atopic dermatitis- affected areas).   [DISCONTINUED] amLODipine (NORVASC) 2.5 MG tablet Take 1 tablet (2.5 mg total) by mouth 2 (two) times daily.   [DISCONTINUED] cloNIDine (CATAPRES) 0.1 MG tablet Take 1/2 tablet twice a day   [DISCONTINUED] ezetimibe (ZETIA) 10 MG tablet Take 10 mg by mouth daily.     Allergies:   Clonidine derivatives, Atorvastatin, Hydrochlorothiazide, Losartan, Micardis hct [telmisartan-hctz], Pravastatin, Statins, Telmisartan, Valsartan-hydrochlorothiazide, and Ibuprofen   Social History   Tobacco Use   Smoking status: Never   Smokeless tobacco: Never  Vaping Use   Vaping Use: Never used  Substance Use Topics   Alcohol use: No   Drug use: No     Family Hx: The patient's family history includes Asthma in her mother; COPD in her mother; Cancer in her maternal grandmother; Hypertension in her father; Stroke in her father.  ROS:   Please see the history of present illness.     All other systems reviewed and are negative.  Prior CV studies:   The following studies were reviewed today:  Cardiac Studies & Procedures       ECHOCARDIOGRAM  ECHOCARDIOGRAM COMPLETE 12/23/2022  Narrative ECHOCARDIOGRAM REPORT    Patient Name:   INGER WIEST Date of Exam: 12/22/2022 Medical Rec #:  [id number]  Height:       65.0 in Accession #:    [id number]      Weight:       287.0 lb Date of Birth:  02/10/1946        BSA:          2.306 m Patient Age:    77 years        BP:           171/83 mmHg Patient Gender: F               HR:           73 bpm. Exam Location:  [place]  Procedure: 2D Echo, Cardiac Doppler, Color Doppler and Strain Analysis  Indications:    Murmur, cardiac [R01.1 (ICD-10-CM)]  History:        Patient has no prior history of Echocardiogram examinations. Signs/Symptoms:Murmur; Risk Factors:Dyslipidemia and Hypertension.  Sonographer:    Margreta Journey RDCS Referring Phys: Rito Ehrlich  [facility]   Sonographer Comments: Patient is obese. Restricted mobility, study performed in Fowler's position. IMPRESSIONS   1. GLS -19.4. Left ventricular ejection fraction, by estimation, is 60 to 65%. The left ventricle has normal function. The left ventricle has no regional wall motion abnormalities. There is mild left ventricular hypertrophy. Left ventricular diastolic parameters are consistent with Grade I diastolic dysfunction (impaired relaxation). 2. Right ventricular systolic function is normal. The right ventricular size is normal. 3. The mitral valve is normal in structure. Mild mitral valve regurgitation. No evidence of mitral stenosis. 4. The aortic valve is normal in structure. Aortic valve regurgitation is not visualized. No aortic stenosis is present. 5. The inferior vena cava is normal in size with greater than 50% respiratory variability, suggesting right atrial pressure of 3 mmHg.  FINDINGS Left Ventricle: GLS -19.4. Left ventricular ejection fraction, by estimation, is 60 to 65%. The left ventricle has normal function. The left ventricle has no regional wall motion abnormalities. The left ventricular internal cavity size was normal in size. There is mild left ventricular hypertrophy. Left ventricular diastolic parameters are consistent with Grade I diastolic dysfunction (impaired relaxation).  Right Ventricle: The right ventricular size is normal. No increase in right ventricular wall thickness. Right ventricular systolic function is normal.  Left Atrium: Left atrial size was normal in size.  Right Atrium: Right atrial size was normal in size.  Pericardium: There is no evidence of pericardial effusion.  Mitral Valve: The mitral valve is normal in structure. Mild mitral valve regurgitation. No evidence of mitral valve stenosis.  Tricuspid Valve: The tricuspid valve is normal in structure. Tricuspid valve regurgitation is not demonstrated. No evidence of tricuspid  stenosis.  Aortic Valve: The aortic valve is normal in structure. Aortic valve regurgitation is not visualized. No aortic stenosis is present.  Pulmonic Valve: The pulmonic valve was normal in structure. Pulmonic valve regurgitation is mild. No evidence of pulmonic stenosis.  Aorta: The aortic root is normal in size and structure.  Venous: The inferior vena cava is normal in size with greater than 50% respiratory variability, suggesting right atrial pressure of 3 mmHg.  IAS/Shunts: No atrial level shunt detected by color flow Doppler.   LEFT VENTRICLE PLAX 2D LVIDd:         4.60 cm   Diastology LVIDs:         3.30 cm   LV e' medial:    6.42 cm/s LV PW:         1.10 cm  LV E/e' medial:  12.8 LV IVS:        1.20 cm   LV e' lateral:   7.62 cm/s LVOT diam:     2.00 cm   LV E/e' lateral: 10.8 LV SV:         90 LV SV Index:   39 LVOT Area:     3.14 cm   RIGHT VENTRICLE             IVC RV S prime:     18.50 cm/s  IVC diam: 1.90 cm TAPSE (M-mode): 2.5 cm  LEFT ATRIUM           Index LA diam:      4.80 cm 2.08 cm/m LA Vol (A4C): 64.9 ml 28.14 ml/m AORTIC VALVE LVOT Vmax:   143.00 cm/s LVOT Vmean:  97.400 cm/s LVOT VTI:    0.288 m  AORTA Ao Root diam: 3.30 cm Ao Asc diam:  3.30 cm Ao Desc diam: 2.10 cm  MITRAL VALVE MV Area (PHT): 3.77 cm     SHUNTS MV Decel Time: 201 msec     Systemic VTI:  0.29 m MV E velocity: 82.00 cm/s   Systemic Diam: 2.00 cm MV A velocity: 102.00 cm/s MV E/A ratio:  0.80  Gypsy Balsam MD Electronically signed by Gypsy Balsam MD Signature Date/Time: 12/23/2022/12:48:11 PM    Final              Labs/Other Tests and Data Reviewed:    EKG:  No ECG reviewed.  Recent Labs: 09/24/2022: Magnesium 1.8 09/30/2022: ALT 23 12/27/2022: B Natriuretic Peptide 50.2; Hemoglobin 10.1; Platelets 207 03/08/2023: BUN 28; Creatinine, Ser 1.20; Potassium 4.2; Sodium 137   Recent Lipid Panel No results found for: "CHOL", "TRIG", "HDL", "CHOLHDL",  "LDLCALC", "LDLDIRECT"  Wt Readings from Last 3 Encounters:  03/10/23 274 lb (124.3 kg)  02/02/23 281 lb 14.4 oz (127.9 kg)  01/11/23 277 lb 1.9 oz (125.7 kg)        Objective:    Vital Signs:  BP (!) 163/87   Pulse 75   Ht 5\' 5"  (1.651 m)   Wt 274 lb (124.3 kg)   BMI 45.60 kg/m    VITAL SIGNS:  reviewed GEN:  no acute distress RESPIRATORY:  normal respiratory effort, symmetric expansion CARDIOVASCULAR:  no peripheral edema  ASSESSMENT & PLAN:    Hypertension-BP not at goal less than 130/80.  Multiple prior medication intolerance.  Reiterated that if we do elect to start doxazosin 1 mg nightly as previously prescribed that this is a low-dose and would be safe.  Reiterated that dizziness typically occurs with higher doses as she was concerned after reading medication packet.  However, she prefers to adjust her current medications and consider Doxazosin for future.  Continue clonidine to 0.05 mg twice daily.  Increase amlodipine to 5 mg in the a.m. and continue 2.5 mg in the evening.  Check in via MyChart message in 1 week.  Reiterated need to assess trend of blood pressure for at least 1 week prior to determining whether additional changes needed.  HFpEF/murmur/peripheral edema-euvolemic on exam.  Not presently requiring as needed Lasix.  Continue leg elevation, compression socks.  Could consider Jardiance/Farxiga however given multiple medication intolerances will defer until her blood pressure is better controlled.     Time:   Today, I have spent 22 minutes with the patient with telehealth technology discussing the above problems.     Medication Adjustments/Labs and Tests Ordered: Current medicines are reviewed at  length with the patient today.  Concerns regarding medicines are outlined above.   Tests Ordered: No orders of the defined types were placed in this encounter.   Medication Changes: Meds ordered this encounter  Medications   ezetimibe (ZETIA) 10 MG tablet     Sig: Take 1 tablet (10 mg total) by mouth daily.    Dispense:  90 tablet    Refill:  3   amLODipine (NORVASC) 2.5 MG tablet    Sig: Take 5mg  ( two tablets) in the morning and 2.5mg  (1 tablet) in the evening    Dispense:  270 tablet    Refill:  3   cloNIDine (CATAPRES) 0.1 MG tablet    Sig: Take 1/2 tablet twice a day    Dispense:  180 tablet    Refill:  3    Follow Up:  Virtual Visit  in 1 month(s)  Signed, Alver Sorrow, NP  03/10/2023 9:44 AM    [facility]

## 2023-03-15 ENCOUNTER — Ambulatory Visit (HOSPITAL_BASED_OUTPATIENT_CLINIC_OR_DEPARTMENT_OTHER): Payer: Medicare Other | Admitting: Cardiovascular Disease

## 2023-04-07 NOTE — Telephone Encounter (Signed)
PATIENT FOLLOWING UP

## 2023-04-08 NOTE — Telephone Encounter (Signed)
Continue current medications. Address at visit. Transition visit to in-person.   Alver Sorrow, NP

## 2023-04-21 ENCOUNTER — Encounter (HOSPITAL_BASED_OUTPATIENT_CLINIC_OR_DEPARTMENT_OTHER): Payer: Self-pay | Admitting: Family

## 2023-04-21 ENCOUNTER — Ambulatory Visit (INDEPENDENT_AMBULATORY_CARE_PROVIDER_SITE_OTHER): Payer: Medicare Other | Admitting: Family

## 2023-04-21 VITALS — BP 202/80 | HR 78 | Ht 65.0 in | Wt 287.0 lb

## 2023-04-21 DIAGNOSIS — R400 Somnolence: Secondary | ICD-10-CM

## 2023-04-21 DIAGNOSIS — R252 Cramp and spasm: Secondary | ICD-10-CM

## 2023-04-21 DIAGNOSIS — I10 Essential (primary) hypertension: Secondary | ICD-10-CM

## 2023-04-21 DIAGNOSIS — R0683 Snoring: Secondary | ICD-10-CM | POA: Diagnosis not present

## 2023-04-21 NOTE — Patient Instructions (Addendum)
Medication Instructions:  Your physician has recommended you make the following change in your medication:   Furosemide (Lasix) half tablet once per day for 2-3 days then return to as needed.  START Doxazosin 1mg  every evening   Labwork: Your physician recommends that you return for lab work today: BMP, magnesium, BNP   Testing/Procedures: Your physician has recommended that you have a sleep study. This test records several body functions during sleep, including: brain activity, eye movement, oxygen and carbon dioxide blood levels, heart rate and rhythm, breathing rate and rhythm, the flow of air through your mouth and nose, snoring, body muscle movements, and chest and belly movement.    Follow-Up: In 1 month with Hypertension Clinic   Special Instructions:  Bring blood pressure cuff to next visit, please.

## 2023-04-21 NOTE — Progress Notes (Signed)
Advanced Hypertension Clinic Initial Assessment:    Date:  04/21/2023   ID:  Jenny Martin, DOB February 02, 1946, MRN [id number]  PCP:  Clayborne Dana, NP  Cardiologist:  Chilton Si, MD  Nephrologist:  Referring MD: Clayborne Dana, NP   CC: Hypertension  History of Present Illness:    Jenny Martin is a 77 y.o. female with a hx of  hypertension, hyperlipidemia, anemia, GERD, asthma, pneumonia, carpal tunnel syndrome, obesity, HFpEF, edema.  Presents today for follow-up with the Advanced Hypertension clinic.   Established with advanced hypertension clinic 02/02/2023.  She had multiple ED visits earlier in the year with elevated blood pressure. He saw Dr. Tomie China 12/07/2022 with BP 180/73.   It was noted she had intolerance to multiple antihypertensives.  Clonidine was initiated.  Renal artery duplex 11/2022 no renal artery stenosis, cyst noted in the right upper pole.  Echo 12/2022 LVEF 60 to 65%, mild LVH, grade 1 diastolic dysfunction, mild MR.  Initial hypertension clinic blood pressure noted be asymmetric but inconsistent which arm was higher and not consistent with coarctation.  She was recommended continue present dose amlodipine and start spironolactone daily in the morning.  She was enrolled in [facility] [facility] . It was felt some of her symptoms in various ED visits may have psychosomatic component.    She subsequently contacted the office noting her blood pressure was persistently 160s over 80s despite spironolactone and she reported she was allergic to it with increased swelling in ankles, legs, face rash.  She self discontinued spironolactone.  She was recommended to trial doxazosin 1 mg nightly but she never took this medication.  Last seen via video visit 03/10/2023.  Clonidine continued at  0.05 mg twice daily .  Amlodipine increased to 5 mg in the morning and 2.5 mg in the evening.  However subsequently with multiple MyChart messages with concerns including swelling of the legs, blurry  vision, dry mouth, constipation, shortness of breath. Amlodipine was reduced to 2.5mg  BID.    Presents today for follow-up with her son. BP at home 150-160s/70-80s. She notes bilateral lower extremity edema in her thighs and calves. She is attributing this swelling to her Amlodipine despite the reduced dose. She last took her Furosemide two nights ago. Most often taking a half tablet twice per week. She is wearing compression stockings (though notes they do not stay up and are painful). " Reiterated fluid restriction with history of hyponatremia. Reiterates she is "allergic to a lot of different things". She is exercising using pedals and her treadmill.    She reports a myriad of concerns today including: "walking in a daze"  She reports soreness in her bilateral shoulders and chest that is worse with movement, reassurance provided likely musculoskeletal component.  PRN Inhaler resolves brief episodes of dyspnea. Notes difficulties with constipation. She reports this has been ongoing since her last colonoscopy. Miralax did not work well per her report. She is chewing prunes which does help.  Difficulty sleeping due to leg cramping. Ran out of her OTC magnesium supplement 4 days ago.   We reviewed need to try medications for at least a week and that all possible side effects are listed on mediations but we would not expect her to experience all of those side effects. In reviewing secondary hypertension causes her son does note that she does snore and she reports waking feeling tired.   Previous antihypertensives Beta-blockers (bisoprolol, carvedilol)-wheezing ACE-swelling, hyponatremia Lasix-hyponatremia (otherwise tolerated) Hydrochlorothiazide-hyponatremia ARB (Telmisartan-rash), Losartan, Valsartan, Irbesartan  Clonidine-neck rash, dry mouth Spironolactone - swelling  Past Medical History:  Diagnosis Date   Abnormal TSH 12/16/2015   Allergic rhinitis 12/10/2015   Anemia    Arachnoid cyst     Arcus senilis of both eyes 12/10/2015   Atopic dermatitis    Blood transfusion without reported diagnosis    Cardiac murmur 12/07/2022   Carpal tunnel syndrome    Chronic vulvitis    Cortical age-related cataract of both eyes 04/27/2018   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   Edema 12/10/2015   Gastroesophageal reflux disease without esophagitis 06/08/2022   H/O bladder infections    History of chicken pox    HTN (hypertension), benign 01/03/2022   Hypercholesterolemia    Hyperlipidemia 12/10/2015   Hyperopia with astigmatism and presbyopia, bilateral 12/10/2015   Hypertension    Hypo-osmolality and hyponatremia 01/08/2022   Hypomagnesemia 01/03/2022   Hyponatremia 01/02/2022   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 04/27/2018   Mild intermittent asthma 12/16/2015   Morbid obesity (HCC) 12/07/2022   Need for prophylactic vaccination with Streptococcus pneumoniae (Pneumococcus) and Influenza vaccines 12/16/2015   Normocytic anemia 01/03/2022   Nuclear sclerotic cataract of both eyes 04/25/2017   Obese    Pneumonia, pneumococcal (HCC)    S/P thoracotomy    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   amLODipine (NORVASC) 2.5 MG tablet Take 5mg  ( two tablets) in the morning and 2.5mg  (1 tablet) in the evening   aspirin 325 MG tablet Take 325 mg by mouth daily as needed for mild pain.   cloNIDine (CATAPRES) 0.1 MG tablet Take 1/2 tablet twice a day   ezetimibe (ZETIA) 10 MG tablet Take 1 tablet (10 mg total) by mouth daily.   ferrous sulfate 325 (65  FE) MG tablet Take 1 tablet (325 mg total) by mouth every other day.   furosemide (LASIX) 20 MG tablet Take 0.5 tablets (10 mg total) by mouth 2 (two) times daily as needed for edema.   Magnesium 500 MG TABS Take 1 tablet by mouth every other day.   Multiple Vitamins-Minerals (CENTRUM MINIS ADULTS 50+) TABS Take 1 tablet by mouth daily in the afternoon.   triamcinolone cream (KENALOG) 0.1 % Apply 1 Application topically 2 (two) times daily as needed (for atopic dermatitis- affected areas).     Allergies:   Clonidine derivatives, Atorvastatin, Hydrochlorothiazide, Losartan, Micardis hct [telmisartan-hctz], Pravastatin, Statins, Telmisartan, Valsartan-hydrochlorothiazide, and Ibuprofen   Social History   Socioeconomic History   Marital status: Divorced    Spouse name: Not on file   Number of children: Not on file   Years of education: Not on file   Highest education level: Not on file  Occupational History   Not on file  Tobacco Use   Smoking status: Never   Smokeless tobacco: Never  Vaping Use   Vaping Use: Never used  Substance and Sexual Activity   Alcohol use: No   Drug use: No   Sexual activity: Never  Other Topics Concern   Not on file  Social History Narrative   Not  on file   Social Determinants of Health   Financial Resource Strain: Not on file  Food Insecurity: No Food Insecurity (09/30/2022)   Hunger Vital Sign    Worried About Running Out of Food in the Last Year: Never true    Ran Out of Food in the Last Year: Never true  Transportation Needs: No Transportation Needs (09/30/2022)   PRAPARE - Administrator, Civil Service (Medical): No    Lack of Transportation (Non-Medical): No  Physical Activity: Insufficiently Active (02/02/2023)   Exercise Vital Sign    Days of Exercise per Week: 5 days    Minutes of Exercise per Session: 20 min  Stress: Not on file  Social Connections: Not on file     Family History: The patient's family history includes  Asthma in her mother; COPD in her mother; Cancer in her maternal grandmother; Hypertension in her father; Stroke in her father.  ROS:   Please see the history of present illness.     All other systems reviewed and are negative.  EKGs/Labs/Other Studies Reviewed:    EKG:  EKG is not ordered today.  Recent Labs: 09/24/2022: Magnesium 1.8 09/30/2022: ALT 23 12/27/2022: B Natriuretic Peptide 50.2; Hemoglobin 10.1; Platelets 207 03/08/2023: BUN 28; Creatinine, Ser 1.20; Potassium 4.2; Sodium 137   Recent Lipid Panel No results found for: "CHOL", "TRIG", "HDL", "CHOLHDL", "VLDL", "LDLCALC", "LDLDIRECT"  Physical Exam:   VS:  BP (!) 202/80   Pulse 78   Ht 5\' 5"  (1.651 m)   Wt 287 lb (130.2 kg)   BMI 47.76 kg/m  , BMI Body mass index is 47.76 kg/m. GENERAL:  Well appearing HEENT: Pupils equal round and reactive, fundi not visualized, oral mucosa unremarkable NECK:  No jugular venous distention, waveform within normal limits, carotid upstroke brisk and symmetric, no bruits, no thyromegaly LYMPHATICS:  No cervical adenopathy LUNGS:  Clear to auscultation bilaterally HEART:  RRR.  PMI not displaced or sustained,S1 and S2 within normal limits, no S3, no S4, no clicks, no rubs, no murmurs ABD:  Flat, positive bowel sounds normal in frequency in pitch, no bruits, no rebound, no guarding, no midline pulsatile mass, no hepatomegaly, no splenomegaly EXT:  2 plus pulses throughout, no edema, no cyanosis no clubbing SKIN:  No rashes no nodules NEURO:  Cranial nerves II through XII grossly intact, motor grossly intact throughout PSYCH:  Cognitively intact, oriented to person place and time   ASSESSMENT/PLAN:    Hypertension-BP not at goal less than 130/80.  Multiple prior medication intolerances detailed above.  She is very hesitant regarding medications. Low suspicion Amlodipine 2.5mg  BID is contributory to edema, management of edema below. Continue Clonidine 0.05mg  BID (did not tolerate higher  doses). Start Doxazsoin 1mg  QHS. Reiterated need to try for a week prior to assessing for side effects or efficacy. Reiterated may need to increase dose in future. Plan for sleep study. If no secondary cause of hypertension identified, will refer her for renal denervation.  Reviewed procedure and provided her a pamphlet today in clinic.  Snores / Sleep disordered breathing - Son notes she snores. She notes daytime somnolence and difficulty sleeping.  Home sleep study ordered today.  Leg cramps - BMP/magnesium today for evaluation.   HFpEF/murmur/peripheral edema-euvolemic on exam.  Continue leg elevation, compression socks. Prior intolerance to Spironolactone. Could consider Jardiance/Farxiga however given multiple medication intolerances will defer until her blood pressure is better controlled. Notes worsening edema, BMP/BNP today. Lasix 10mg  QD x 3 days then return  to PRN.    Screening for Secondary Hypertension:     02/02/2023    9:45 AM  Causes  Drugs/Herbals Screened     - Comments No EtOH.  No NSAIDs.  Tries to limit sodium.  1 coffee daily  Renovascular HTN Screened     - Comments Normal 11/2022  Sleep Apnea N/A     - Comments no snoring  Thyroid Disease Screened     - Comments normal 07/2022  Hyperaldosteronism N/A  Pheochromocytoma N/A  Cushing's Syndrome N/A  Hyperparathyroidism N/A  Coarctation of the Aorta Screened  Compliance Screened     - Comments BP higher R>L    Relevant Labs/Studies:    Latest Ref Rng & Units 03/08/2023   11:31 AM 02/17/2023   10:41 AM 12/27/2022    8:01 PM  Basic Labs  Sodium 134 - 144 mmol/L 137  135  134   Potassium 3.5 - 5.2 mmol/L 4.2  4.4  4.0   Creatinine 0.57 - 1.00 mg/dL 3.08  6.57  8.46                    12/15/2022    9:41 AM  Renovascular   Renal Artery [place] Completed Yes        she consents to be monitored in our remote patient monitoring program through [employer].  she will track his blood pressure twice daily and understands  that these trends will help [place] to adjust her medications as needed prior to his next appointment.   Disposition:    FU with MD/PharmD/APP in 1 months    Medication Adjustments/Labs and Tests Ordered: Current medicines are reviewed at length with the patient today.  Concerns regarding medicines are outlined above.  Orders Placed This Encounter  Procedures   Basic metabolic panel   Brain natriuretic peptide   Magnesium   Home sleep test   No orders of the defined types were placed in this encounter.    Signed, Alver Sorrow, NP  04/21/2023 1:32 PM    [facility]

## 2023-04-22 LAB — BASIC METABOLIC PANEL
BUN/Creatinine Ratio: 19 (ref 12–28)
BUN: 26 mg/dL (ref 8–27)
CO2: 23 mmol/L (ref 20–29)
Calcium: 9.2 mg/dL (ref 8.7–10.3)
Chloride: 101 mmol/L (ref 96–106)
Creatinine, Ser: 1.34 mg/dL — ABNORMAL HIGH (ref 0.57–1.00)
Glucose: 84 mg/dL (ref 70–99)
Potassium: 4.6 mmol/L (ref 3.5–5.2)
Sodium: 139 mmol/L (ref 134–144)
eGFR: 41 mL/min/{1.73_m2} — ABNORMAL LOW (ref >59)

## 2023-04-22 LAB — MAGNESIUM: Magnesium: 2.3 mg/dL (ref 1.6–2.3)

## 2023-04-22 LAB — BRAIN NATRIURETIC PEPTIDE: BNP: 115.8 pg/mL — ABNORMAL HIGH (ref 0.0–100.0)

## 2023-04-25 ENCOUNTER — Telehealth (HOSPITAL_BASED_OUTPATIENT_CLINIC_OR_DEPARTMENT_OTHER): Payer: Self-pay

## 2023-04-25 DIAGNOSIS — I10 Essential (primary) hypertension: Secondary | ICD-10-CM

## 2023-04-25 DIAGNOSIS — E782 Mixed hyperlipidemia: Secondary | ICD-10-CM

## 2023-04-25 NOTE — Addendum Note (Signed)
Addended by: Marlene Lard on: 04/25/2023 07:33 AM   Modules accepted: Orders

## 2023-04-25 NOTE — Telephone Encounter (Addendum)
Seen by patient Jenny Martin on 04/24/2023  6:11 PM; follow up mychart message, labs mailed to patient.    ----- Message from Alver Sorrow, NP sent at 04/24/2023  6:10 PM EDT ----- Kidney function shows slight decline from prior. BNP with mild volume overload. Normal electrolytes.   She was instructed in clinic to increase Lasix to 10mg  daily x 3 days which should have helped with volume overload, swelling. If still swollen, take Lasix 10mg  for an additional 2 days then return to as needed. Slight decline in kidney function likely related to volume overload.   Recommend repeat BMP in 2-3 weeks for monitoring.

## 2023-05-09 MED ORDER — EZETIMIBE 10 MG PO TABS: 10.0000 mg | ORAL_TABLET | Freq: Every day | ORAL | 3 refills | Status: DC

## 2023-05-09 MED ORDER — FUROSEMIDE 20 MG PO TABS: 20.0000 mg | ORAL_TABLET | Freq: Two times a day (BID) | ORAL | 2 refills | Status: DC | PRN

## 2023-05-09 NOTE — Addendum Note (Signed)
Addended by: Marlene Lard on: 05/09/2023 04:48 PM   Modules accepted: Orders

## 2023-05-09 NOTE — Telephone Encounter (Signed)
Okay to rx 20mg  of lasix?

## 2023-05-09 NOTE — Telephone Encounter (Signed)
Okay to send in Lasix 20mg  mg tablets.   Alver Sorrow, NP

## 2023-05-10 LAB — BASIC METABOLIC PANEL
BUN/Creatinine Ratio: 25 (ref 12–28)
BUN: 35 mg/dL — ABNORMAL HIGH (ref 8–27)
CO2: 26 mmol/L (ref 20–29)
Calcium: 9.2 mg/dL (ref 8.7–10.3)
Chloride: 100 mmol/L (ref 96–106)
Creatinine, Ser: 1.4 mg/dL — ABNORMAL HIGH (ref 0.57–1.00)
Glucose: 94 mg/dL (ref 70–99)
Potassium: 4.4 mmol/L (ref 3.5–5.2)
Sodium: 139 mmol/L (ref 134–144)
eGFR: 39 mL/min/{1.73_m2} — ABNORMAL LOW (ref >59)

## 2023-05-10 MED ORDER — FUROSEMIDE 40 MG PO TABS: 20.0000 mg | ORAL_TABLET | Freq: Two times a day (BID) | ORAL | 3 refills | Status: AC | PRN

## 2023-05-10 NOTE — Telephone Encounter (Signed)
Ok to send in 40mg  with instructions to take half tablet?

## 2023-05-10 NOTE — Telephone Encounter (Signed)
Low suspicion half of the 40mg  tablet works any differently than the 20mg  tablet but if that is what she wants to take that is fine. Likely just 30 tabs with refills.   Alver Sorrow, NP

## 2023-05-10 NOTE — Addendum Note (Signed)
Addended by: Marlene Lard on: 05/10/2023 03:50 PM   Modules accepted: Orders

## 2023-05-11 ENCOUNTER — Telehealth (HOSPITAL_BASED_OUTPATIENT_CLINIC_OR_DEPARTMENT_OTHER): Payer: Self-pay

## 2023-05-11 DIAGNOSIS — I10 Essential (primary) hypertension: Secondary | ICD-10-CM

## 2023-05-11 NOTE — Telephone Encounter (Addendum)
Called patient to discuss lab results, results reviewed and patient verbalizes understanding.     ----- Message from Alver Sorrow, NP sent at 05/11/2023  7:34 AM EDT ----- Kidney function slightly decreased from previous.  Ensure using Lasix sparingly.  Normal electrolytes.  Recommend increasing water intake and repeat BMP in 1-2 weeks to reassess.

## 2023-05-12 NOTE — Addendum Note (Signed)
Addended by: Brunetta Genera on: 05/12/2023 03:17 PM   Modules accepted: Orders

## 2023-05-26 ENCOUNTER — Ambulatory Visit (INDEPENDENT_AMBULATORY_CARE_PROVIDER_SITE_OTHER): Payer: Medicare Other | Admitting: Family

## 2023-05-26 ENCOUNTER — Encounter (HOSPITAL_BASED_OUTPATIENT_CLINIC_OR_DEPARTMENT_OTHER): Payer: Self-pay | Admitting: Family

## 2023-05-26 VITALS — BP 215/99 | HR 84 | Ht 65.0 in | Wt 293.0 lb

## 2023-05-26 DIAGNOSIS — R0683 Snoring: Secondary | ICD-10-CM

## 2023-05-26 DIAGNOSIS — R0602 Shortness of breath: Secondary | ICD-10-CM

## 2023-05-26 DIAGNOSIS — I1A0 Resistant hypertension: Secondary | ICD-10-CM | POA: Diagnosis not present

## 2023-05-26 DIAGNOSIS — R252 Cramp and spasm: Secondary | ICD-10-CM

## 2023-05-26 DIAGNOSIS — Z006 Encounter for examination for normal comparison and control in clinical research program: Secondary | ICD-10-CM

## 2023-05-26 DIAGNOSIS — I5032 Chronic diastolic (congestive) heart failure: Secondary | ICD-10-CM

## 2023-05-26 MED ORDER — CHLORTHALIDONE 25 MG PO TABS: 25.0000 mg | ORAL_TABLET | Freq: Every day | ORAL | 2 refills | Status: DC

## 2023-05-26 MED ORDER — ALBUTEROL SULFATE HFA 108 (90 BASE) MCG/ACT IN AERS: 2.0000 | INHALATION_SPRAY | Freq: Four times a day (QID) | RESPIRATORY_TRACT | 1 refills | Status: DC | PRN

## 2023-05-26 MED ORDER — HYDRALAZINE HCL 10 MG PO TABS: 10.0000 mg | ORAL_TABLET | ORAL | 1 refills | Status: DC | PRN

## 2023-05-26 NOTE — Progress Notes (Signed)
Advanced Hypertension Clinic Initial Assessment:    Date:  05/26/2023   ID:  Jenny Martin, DOB [birth year], MRN [id number]  PCP:  Clayborne Dana, NP  Cardiologist:  Chilton Si, MD  Nephrologist:  Referring MD: Clayborne Dana, NP   CC: Hypertension  History of Present Illness:    Jenny Martin is a 77 y.o. female with a hx of  hypertension, hyperlipidemia, anemia, GERD, asthma, pneumonia, carpal tunnel syndrome, obesity, HFpEF, edema.  Presents today for follow-up with the Advanced Hypertension clinic.   Established with advanced hypertension clinic 02/02/2023.  She had multiple ED visits earlier in the year with elevated blood pressure. He saw Dr. Tomie China 12/07/2022 with BP 180/73.   It was noted she had intolerance to multiple antihypertensives.  Clonidine was initiated.  Renal artery duplex 11/2022 no renal artery stenosis, cyst noted in the right upper pole.  Echo 12/2022 LVEF 60 to 65%, mild LVH, grade 1 diastolic dysfunction, mild MR.  Initial hypertension clinic blood pressure noted be asymmetric but inconsistent which arm was higher and not consistent with coarctation.  She was recommended continue present dose amlodipine and start spironolactone daily in the morning.  She was enrolled in [facility] . It was felt some of her symptoms in various ED visits may have psychosomatic component.    She subsequently contacted the office noting her blood pressure was persistently 160s over 80s despite spironolactone and she reported she was allergic to it with increased swelling in ankles, legs, face rash.  She self discontinued spironolactone.  She was recommended to trial doxazosin 1 mg nightly but she never took this medication.  Seen via video visit 03/10/2023.  Clonidine continued at  0.05 mg twice daily .  Amlodipine increased to 5 mg in the morning and 2.5 mg in the evening.  However subsequently with multiple MyChart messages with concerns including swelling of the legs, blurry  vision, dry mouth, constipation, shortness of breath. Amlodipine was reduced to 2.5mg  BID.   Last seen 04/21/23 with a myriad of concerns, she was recommended Doxazosin 1mg  at bedtime was added but it is not clear if she ever took this medication. She was instructed to take Lasix 10mg  daily for 3 days due to LE edema. Home sleep study ordered due to fatigue and her son noted snoring.    Presents today for follow-up independently. BP at home 150-160s/70-80s. She is exercising using pedals and her treadmill. We again reviewed need to try medications for at least a week and that all possible side effects are listed on mediations but we would not expect her to experience all of those side effects. BP at home 150-170. She has a myriad of concerns today detailed below. She requests to discontinue Amlodipine and Clonidine as she feels these are the cause of her side effects despite previously reduced doses. Reports sleeping better and does not wish to pursue sleep study. She shares with me that she researched medications for individuals >65yo and would like to take a thiazide diuretic. We discussed her previous hyponatremia which she attributes to a liver cleanse she was doing at the time.   Concerns today Constipation requiring castor oil - encouraged to discuss with PCP. Reassurance provided unlikely from antihypertensives.  Leg swelling - bilateral, wearing compression stockings, has not been taking PRN Lasix due to concerns regarding her kidneys.  Dry mouth  Acid reflux Generalized joint pain Difficulty walking Stress  Waking with phlegm in her chest Concerned about long term COVID  Positional neck pain - most notable when looking down   Previous antihypertensives Beta-blockers (bisoprolol, carvedilol)-wheezing ACE-swelling, hyponatremia Lasix-hyponatremia (otherwise tolerated) Hydrochlorothiazide-hyponatremia ARB (Telmisartan-rash), Losartan, Valsartan, Irbesartan Clonidine-neck rash, dry  mouth Spironolactone - swelling Amlodipine Clonidine  Past Medical History:  Diagnosis Date   Abnormal TSH 12/16/2015   Allergic rhinitis 12/10/2015   Anemia    Arachnoid cyst    Arcus senilis of both eyes 12/10/2015   Atopic dermatitis    Blood transfusion without reported diagnosis    Cardiac murmur 12/07/2022   Carpal tunnel syndrome    Chronic vulvitis    Cortical age-related cataract of both eyes 04/27/2018   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   Edema 12/10/2015   Gastroesophageal reflux disease without esophagitis 06/08/2022   H/O bladder infections    History of chicken pox    HTN (hypertension), benign 01/03/2022   Hypercholesterolemia    Hyperlipidemia 12/10/2015   Hyperopia with astigmatism and presbyopia, bilateral 12/10/2015   Hypertension    Hypo-osmolality and hyponatremia 01/08/2022   Hypomagnesemia 01/03/2022   Hyponatremia 01/02/2022   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 04/27/2018   Mild intermittent asthma 12/16/2015   Morbid obesity (HCC) 12/07/2022   Need for prophylactic vaccination with Streptococcus pneumoniae (Pneumococcus) and Influenza vaccines 12/16/2015   Normocytic anemia 01/03/2022   Nuclear sclerotic cataract of both eyes 04/25/2017   Obese    Pneumonia, pneumococcal (HCC)    S/P thoracotomy    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   ezetimibe (ZETIA) 10 MG tablet Take 1 tablet (10 mg total) by mouth daily.   ferrous sulfate 325 (65 FE) MG tablet Take 1 tablet (325 mg total) by mouth every other day.   furosemide (LASIX) 40 MG tablet Take 0.5 tablets (20 mg total) by  mouth 2 (two) times daily as needed for edema or fluid.   hydrALAZINE (APRESOLINE) 10 MG tablet Take 1 tablet (10 mg total) by mouth as needed (Take one tablet as needed for systolic blood pressure more than 180. If BP still more than 180 after 30 minutes, take a second tablet.).   Magnesium 500 MG TABS Take 1 tablet by mouth every other day.   Multiple Vitamins-Minerals (CENTRUM MINIS ADULTS 50+) TABS Take 1 tablet by mouth daily in the afternoon.   triamcinolone cream (KENALOG) 0.1 % Apply 1 Application topically 2 (two) times daily as needed (for atopic dermatitis- affected areas).   [DISCONTINUED] albuterol (VENTOLIN HFA) 108 (90 Base) MCG/ACT inhaler Inhale 2 puffs into the lungs every 6 (six) hours as needed for shortness of breath or wheezing.   [DISCONTINUED] amLODipine (NORVASC) 2.5 MG tablet Take 5mg  ( two tablets) in the morning and 2.5mg  (1 tablet) in the evening   [DISCONTINUED] aspirin 325 MG tablet Take 325 mg by mouth daily as needed for mild pain.   [DISCONTINUED] cloNIDine (CATAPRES) 0.1 MG tablet Take 1/2 tablet twice a day     Allergies:   Clonidine derivatives, Atorvastatin, Hydrochlorothiazide, Losartan, Micardis hct [telmisartan-hctz], Pravastatin, Statins, Telmisartan, Valsartan-hydrochlorothiazide, and Ibuprofen   Social History   Socioeconomic History   Marital status: Divorced    Spouse name:  Not on file   Number of children: Not on file   Years of education: Not on file   Highest education level: Not on file  Occupational History   Not on file  Tobacco Use   Smoking status: Never   Smokeless tobacco: Never  Vaping Use   Vaping status: Never Used  Substance and Sexual Activity   Alcohol use: No   Drug use: No   Sexual activity: Never  Other Topics Concern   Not on file  Social History Narrative   Not on file   Social Determinants of Health   Financial Resource Strain: Not on file  Food Insecurity: No Food Insecurity (09/30/2022)   Hunger Vital Sign     Worried About Running Out of Food in the Last Year: Never true    Ran Out of Food in the Last Year: Never true  Transportation Needs: No Transportation Needs (09/30/2022)   PRAPARE - Administrator, Civil Service (Medical): No    Lack of Transportation (Non-Medical): No  Physical Activity: Insufficiently Active (02/02/2023)   Exercise Vital Sign    Days of Exercise per Week: 5 days    Minutes of Exercise per Session: 20 min  Stress: Not on file  Social Connections: Not on file     Family History: The patient's family history includes Asthma in her mother; COPD in her mother; Cancer in her maternal grandmother; Hypertension in her father; Stroke in her father.  ROS:   Please see the history of present illness.    Review of Systems  Constitutional: Positive for malaise/fatigue. Negative for chills and fever.  HENT:  Positive for congestion.   Cardiovascular:  Positive for dyspnea on exertion and leg swelling (bilateral). Negative for chest pain, near-syncope, orthopnea, palpitations and syncope.  Respiratory:  Negative for cough, shortness of breath and wheezing.   Musculoskeletal:  Positive for joint pain (diffuse) and neck pain (poitional).  Gastrointestinal:  Positive for change in bowel habit and constipation. Negative for nausea and vomiting.  Neurological:  Negative for dizziness, light-headedness and weakness.     All other systems reviewed and are negative.  EKGs/Labs/Other Studies Reviewed:    EKG:  EKG is not ordered today.  Recent Labs: 09/30/2022: ALT 23 12/27/2022: Hemoglobin 10.1; Platelets 207 04/21/2023: BNP 115.8; Magnesium 2.3 05/10/2023: BUN 35; Creatinine, Ser 1.40; Potassium 4.4; Sodium 139   Recent Lipid Panel No results found for: "CHOL", "TRIG", "HDL", "CHOLHDL", "VLDL", "LDLCALC", "LDLDIRECT"  Physical Exam:   VS:  BP (!) 215/99 (BP Location: Right Arm, Patient Position: Sitting, Cuff Size: Large)   Pulse 84   Ht 5\' 5"  (1.651 m)   Wt 293  lb (132.9 kg)   SpO2 96%   BMI 48.76 kg/m  , BMI Body mass index is 48.76 kg/m. GENERAL:  Well appearing HEENT: Pupils equal round and reactive, fundi not visualized, oral mucosa unremarkable NECK:  No jugular venous distention, waveform within normal limits, carotid upstroke brisk and symmetric, no bruits, no thyromegaly LYMPHATICS:  No cervical adenopathy LUNGS:  Clear to auscultation bilaterally HEART:  RRR.  PMI not displaced or sustained,S1 and S2 within normal limits, no S3, no S4, no clicks, no rubs, no murmurs ABD:  Flat, positive bowel sounds normal in frequency in pitch, no bruits, no rebound, no guarding, no midline pulsatile mass, no hepatomegaly, no splenomegaly EXT:  2 plus pulses throughout, no cyanosis no clubbing, bilateral LE with 2+ pitting edema (lymphedema like appearance) SKIN:  No rashes no nodules  NEURO:  Cranial nerves II through XII grossly intact, motor grossly intact throughout PSYCH:  Cognitively intact, oriented to person place and time. Anxious.    ASSESSMENT/PLAN:    Hypertension-BP not at goal less than 130/80.  Multiple prior medication intolerances detailed above.  She is very hesitant regarding medications. Today she insists most of her symptoms (constipation, joint pain, edema) are related to Amlodipine 2.5mg  and Clonidine 0.05mg  despite previously reduced doses. We had a frank discussion about need to control BP and give medications adequate chance to prevent stroke.  Discontinue Clonidine, Amlodipine. She is most interested in taking thiazide diuretic per her own independently research. She attributes her prior hyponatremia to liver cleanse she was doing at the time. Start Chlorthalidone 25mg  every day, BMP 1 week. Will Rx Hydralazine 10mg  PRN for SBP >180.  Future considerations include Doxazosin (Has been previously prescribed but never taken) Renal denervation could be considered in the future.   Snores / Sleep disordered breathing - Son previously  noted she snores. She notes daytime somnolence and difficulty sleeping have improved since last visit and declines home sleep study.  Leg cramps - 04/21/23 magnesium, potassium were normal. Continue magnesium supplement.   SOB - Will refill PRN Albuterol. Further follow up per PCP.  Constipation - Reiterated low suspicion this is related to her antihypertensive regimen. Encouraged to follow up with PCP.    HFpEF/murmur/peripheral edema - Weight up 6 lbs since last clinic visit. Continue leg elevation, compression socks. Prior intolerance to Spironolactone. Could consider Jardiance/Farxiga however given multiple medication intolerances will defer until her blood pressure is better controlled. Notes worsening edema 2+ on exam. Plan for Lasix 20mg  2x per week. Add Chlorthalidone 25mg  every day, as above.     Screening for Secondary Hypertension:     02/02/2023    9:45 AM  Causes  Drugs/Herbals Screened     - Comments No EtOH.  No NSAIDs.  Tries to limit sodium.  1 coffee daily  Renovascular HTN Screened     - Comments Normal 11/2022  Sleep Apnea N/A     - Comments no snoring  Thyroid Disease Screened     - Comments normal 07/2022  Hyperaldosteronism N/A  Pheochromocytoma N/A  Cushing's Syndrome N/A  Hyperparathyroidism N/A  Coarctation of the Aorta Screened  Compliance Screened     - Comments BP higher R>L    Relevant Labs/Studies:    Latest Ref Rng & Units 05/10/2023   10:54 AM 04/21/2023   11:22 AM 03/08/2023   11:31 AM  Basic Labs  Sodium 134 - 144 mmol/L 139  139  137   Potassium 3.5 - 5.2 mmol/L 4.4  4.6  4.2   Creatinine 0.57 - 1.00 mg/dL 6.04  5.40  9.81                    12/15/2022    9:41 AM  Renovascular   Renal Artery [place] Completed Yes     Disposition:    FU with MD/PharmD/APP in 2-3 weeks   Medication Adjustments/Labs and Tests Ordered: Current medicines are reviewed at length with the patient today.  Concerns regarding medicines are outlined above.  Orders  Placed This Encounter  Procedures   Basic metabolic panel   Brain natriuretic peptide   Meds ordered this encounter  Medications   chlorthalidone (HYGROTON) 25 MG tablet    Sig: Take 1 tablet (25 mg total) by mouth daily.    Dispense:  30 tablet    Refill:  2    DISCONTINUE CLonidine, Amlodipine    Order Specific Question:   Supervising Provider    Answer:   Jodelle Red [[id number]]   hydrALAZINE (APRESOLINE) 10 MG tablet    Sig: Take 1 tablet (10 mg total) by mouth as needed (Take one tablet as needed for systolic blood pressure more than 180. If BP still more than 180 after 30 minutes, take a second tablet.).    Dispense:  30 tablet    Refill:  1    Order Specific Question:   Supervising Provider    Answer:   Janean Sark   albuterol (VENTOLIN HFA) 108 (90 Base) MCG/ACT inhaler    Sig: Inhale 2 puffs into the lungs every 6 (six) hours as needed for shortness of breath or wheezing.    Dispense:  8 g    Refill:  1    May provide whatever inhaler size available    Order Specific Question:   Supervising Provider    Answer:   Jodelle Red [[id number]]     Signed, Alver Sorrow, NP  05/26/2023 7:06 PM    [facility]

## 2023-05-26 NOTE — Patient Instructions (Addendum)
Medication Instructions:  Your physician has recommended you make the following change in your medication:   STOP Amlodipine  STOP Clonidine   START Chlorthalidone 25mg  daily  START Hydralazine 10mg  as needed for systolic blood pressure (top number) more than 180 If you get a BP of more than 180, please take one tablet If after 30 minutes your blood pressure is still more than 180, you may take a second tablet If blood pressure is still more than 180, recommend calling the office for next steps  CONTINUE Furosemide 20mg  (half of a 40mg  tablet) as need twice per week for swelling   Labwork: Your physician recommends that you return for lab work in one week: BNP, BMP   Follow-Up: August 8th at 9:15am with Dr. Duke Salvia   &   9/10 with Dr. Duke Salvia at 10:30am  Special Instructions:   Recommend restricting to less than 2L or 64 oz of fluid per day.   Keep up the good work with the low sodium diet!  To prevent or reduce lower extremity swelling: Eat a low salt diet. Salt makes the body hold onto extra fluid which causes swelling. Sit with legs elevated. For example, in the recliner or on an ottoman.  Wear knee-high compression stockings during the daytime. Ones labeled 15-20 mmHg provide good compression.

## 2023-05-27 ENCOUNTER — Encounter (HOSPITAL_BASED_OUTPATIENT_CLINIC_OR_DEPARTMENT_OTHER): Payer: Self-pay

## 2023-05-27 ENCOUNTER — Telehealth: Payer: Self-pay | Admitting: Cardiovascular Disease

## 2023-05-27 DIAGNOSIS — I1A0 Resistant hypertension: Secondary | ICD-10-CM

## 2023-05-27 DIAGNOSIS — R0602 Shortness of breath: Secondary | ICD-10-CM

## 2023-05-27 NOTE — Telephone Encounter (Signed)
Responded to patient in mychart encounter. Encouraged patient to give medications more than one day for medication changes to take effect. Patient read message and called the office. Again encouraged patient to give the medication time to take effect. Patient states she has taken two doses of her prn hydralazine in addition to the medication that was changed and her pressure is still 193/93. Patient states she was advised to call the office, if she took both Prn hydralazine doses and her blood pressure did not reduce to see what her next steps are.

## 2023-05-27 NOTE — Telephone Encounter (Signed)
As previously recommended she will need to give medications 5-7 days to make a change.   If her BP is still >180 one hour after most recent Hydralazine dose, she may take a 3rd tablet.  Alver Sorrow, NP

## 2023-05-27 NOTE — Addendum Note (Signed)
Addended by: Alver Sorrow on: 05/27/2023 09:12 AM   Modules accepted: Orders

## 2023-05-27 NOTE — Telephone Encounter (Signed)
Patient is asking that the nurse gives her a call. Calling because the medication didn't help her bp to go down. Wanting to know what to do next. Please advise

## 2023-05-27 NOTE — Telephone Encounter (Signed)
Returned call to patient and reviewed the following recommendations, patient verbalizes understanding!     "As previously recommended she will need to give medications 5-7 days to make a change.    If her BP is still >180 one hour after most recent Hydralazine dose, she may take a 3rd tablet.   Alver Sorrow, NP"

## 2023-05-31 MED ORDER — HYDRALAZINE HCL 25 MG PO TABS: 25.0000 mg | ORAL_TABLET | Freq: Two times a day (BID) | ORAL | 6 refills | Status: DC

## 2023-06-03 ENCOUNTER — Telehealth (HOSPITAL_BASED_OUTPATIENT_CLINIC_OR_DEPARTMENT_OTHER): Payer: Self-pay

## 2023-06-03 DIAGNOSIS — I1A0 Resistant hypertension: Secondary | ICD-10-CM

## 2023-06-03 LAB — BASIC METABOLIC PANEL
BUN/Creatinine Ratio: 18 (ref 12–28)
BUN: 31 mg/dL — ABNORMAL HIGH (ref 8–27)
CO2: 26 mmol/L (ref 20–29)
Calcium: 9.6 mg/dL (ref 8.7–10.3)
Chloride: 99 mmol/L (ref 96–106)
Creatinine, Ser: 1.68 mg/dL — ABNORMAL HIGH (ref 0.57–1.00)
Glucose: 102 mg/dL — ABNORMAL HIGH (ref 70–99)
Potassium: 4.1 mmol/L (ref 3.5–5.2)
Sodium: 139 mmol/L (ref 134–144)
eGFR: 32 mL/min/{1.73_m2} — ABNORMAL LOW (ref >59)

## 2023-06-03 LAB — BRAIN NATRIURETIC PEPTIDE: BNP: 101 pg/mL — ABNORMAL HIGH (ref 0.0–100.0)

## 2023-06-03 NOTE — Telephone Encounter (Addendum)
Results called to patient who verbalizes understanding! Labs ordered for week. Patient verbalizes understanding.    ----- Message from Jenny Martin sent at 06/03/2023  4:55 PM EDT ----- Kidney function slightly decreased from previous. The Chlorthalidone is a little but of a fluid pill so it is important to stay well hydrated. Sodium has remained normal. BNP with no significant volume overload.   Continue present dose Chlorthalidone 25mg  daily and Hydralazine 25mg  twice daily. Increase oral hydration and repeat BMP in 1 week for monitoring.

## 2023-06-03 NOTE — Addendum Note (Signed)
Addended by: Marlene Lard on: 06/03/2023 05:02 PM   Modules accepted: Orders

## 2023-06-06 NOTE — Research (Signed)
I saw pt today after Jenny Martin- NP follow up visit. Pt is in Dr. Leonides Sake Virtual [facility] HTN Study. Pt filled out research survey. Pt was enrolled in Group [facility].

## 2023-06-11 ENCOUNTER — Encounter (HOSPITAL_BASED_OUTPATIENT_CLINIC_OR_DEPARTMENT_OTHER): Payer: Self-pay

## 2023-06-11 ENCOUNTER — Observation Stay (HOSPITAL_BASED_OUTPATIENT_CLINIC_OR_DEPARTMENT_OTHER)
Admission: EM | Admit: 2023-06-11 | Payer: Medicare Other | Source: Home / Self Care | Attending: Emergency Medicine | Admitting: Emergency Medicine

## 2023-06-11 ENCOUNTER — Emergency Department (HOSPITAL_BASED_OUTPATIENT_CLINIC_OR_DEPARTMENT_OTHER): Payer: Medicare Other

## 2023-06-11 ENCOUNTER — Other Ambulatory Visit: Payer: Self-pay

## 2023-06-11 DIAGNOSIS — I10 Essential (primary) hypertension: Secondary | ICD-10-CM | POA: Diagnosis present

## 2023-06-11 DIAGNOSIS — Z79899 Other long term (current) drug therapy: Secondary | ICD-10-CM | POA: Diagnosis not present

## 2023-06-11 DIAGNOSIS — J4520 Mild intermittent asthma, uncomplicated: Secondary | ICD-10-CM | POA: Insufficient documentation

## 2023-06-11 DIAGNOSIS — I5032 Chronic diastolic (congestive) heart failure: Secondary | ICD-10-CM | POA: Diagnosis present

## 2023-06-11 DIAGNOSIS — R002 Palpitations: Secondary | ICD-10-CM

## 2023-06-11 DIAGNOSIS — Z789 Other specified health status: Secondary | ICD-10-CM

## 2023-06-11 DIAGNOSIS — R0602 Shortness of breath: Secondary | ICD-10-CM | POA: Diagnosis not present

## 2023-06-11 DIAGNOSIS — R079 Chest pain, unspecified: Secondary | ICD-10-CM | POA: Diagnosis not present

## 2023-06-11 DIAGNOSIS — I130 Hypertensive heart and chronic kidney disease with heart failure and stage 1 through stage 4 chronic kidney disease, or unspecified chronic kidney disease: Secondary | ICD-10-CM | POA: Diagnosis not present

## 2023-06-11 DIAGNOSIS — I1A0 Resistant hypertension: Secondary | ICD-10-CM

## 2023-06-11 DIAGNOSIS — D649 Anemia, unspecified: Secondary | ICD-10-CM | POA: Diagnosis present

## 2023-06-11 DIAGNOSIS — N1832 Chronic kidney disease, stage 3b: Secondary | ICD-10-CM | POA: Diagnosis not present

## 2023-06-11 HISTORY — DX: Palpitations: R00.2

## 2023-06-11 LAB — BASIC METABOLIC PANEL
Anion gap: 12 (ref 5–15)
BUN: 31 mg/dL — ABNORMAL HIGH (ref 8–23)
CO2: 27 mmol/L (ref 22–32)
Calcium: 9.2 mg/dL (ref 8.9–10.3)
Chloride: 94 mmol/L — ABNORMAL LOW (ref 98–111)
Creatinine, Ser: 1.28 mg/dL — ABNORMAL HIGH (ref 0.44–1.00)
GFR, Estimated: 44 mL/min — ABNORMAL LOW (ref >60)
Glucose, Bld: 94 mg/dL (ref 70–99)
Potassium: 4.3 mmol/L (ref 3.5–5.1)
Sodium: 133 mmol/L — ABNORMAL LOW (ref 135–145)

## 2023-06-11 LAB — CBC
HCT: 25.7 % — ABNORMAL LOW (ref 36.0–46.0)
Hemoglobin: 8.4 g/dL — ABNORMAL LOW (ref 12.0–15.0)
MCH: 29.2 pg (ref 26.0–34.0)
MCHC: 32.7 g/dL (ref 30.0–36.0)
MCV: 89.2 fL (ref 80.0–100.0)
Platelets: 248 10*3/uL (ref 150–400)
RBC: 2.88 MIL/uL — ABNORMAL LOW (ref 3.87–5.11)
RDW: 13.5 % (ref 11.5–15.5)
WBC: 5.1 10*3/uL (ref 4.0–10.5)
nRBC: 0 % (ref 0.0–0.2)

## 2023-06-11 LAB — TROPONIN I (HIGH SENSITIVITY)
Troponin I (High Sensitivity): 38 ng/L — ABNORMAL HIGH (ref <18)
Troponin I (High Sensitivity): 45 ng/L — ABNORMAL HIGH (ref <18)

## 2023-06-11 MED ORDER — ALBUTEROL SULFATE HFA 108 (90 BASE) MCG/ACT IN AERS
2.0000 | INHALATION_SPRAY | RESPIRATORY_TRACT | Status: DC | PRN
  Administered 2023-06-11: 2 via RESPIRATORY_TRACT
  Filled 2023-06-11: qty 6.7

## 2023-06-11 MED ORDER — ACETAMINOPHEN 500 MG PO TABS
1000.0000 mg | ORAL_TABLET | Freq: Once | ORAL | Status: AC
  Administered 2023-06-11: 1000 mg via ORAL
  Filled 2023-06-11: qty 2

## 2023-06-11 MED ORDER — HYDRALAZINE HCL 25 MG PO TABS
25.0000 mg | ORAL_TABLET | Freq: Three times a day (TID) | ORAL | Status: DC
  Administered 2023-06-11: 25 mg via ORAL
  Filled 2023-06-11: qty 1

## 2023-06-11 MED ORDER — CHLORTHALIDONE 25 MG PO TABS
25.0000 mg | ORAL_TABLET | Freq: Every day | ORAL | Status: DC
  Administered 2023-06-12 – 2023-06-13 (×2): 25 mg via ORAL
  Filled 2023-06-11 (×3): qty 1

## 2023-06-11 MED ORDER — METOPROLOL TARTRATE 5 MG/5ML IV SOLN: 5.0000 mg | INTRAVENOUS | Status: DC | PRN

## 2023-06-11 MED ORDER — ALBUTEROL SULFATE (2.5 MG/3ML) 0.083% IN NEBU: 2.5000 mg | INHALATION_SOLUTION | RESPIRATORY_TRACT | Status: DC | PRN

## 2023-06-11 NOTE — ED Notes (Signed)
Seen before triage. WOB WNL, concerned that recent change in meds my be causing SOB, SpO 97% HR 105

## 2023-06-11 NOTE — ED Triage Notes (Signed)
The patient stated her blood pressure medication two weeks ago. She stated to hear her heart beat in her head and stated it has been beating really fast. Today she stated she was having trouble breathing.

## 2023-06-11 NOTE — ED Notes (Signed)
ED TO INPATIENT HANDOFF REPORT  ED Nurse Name and Phone #: Dortha Schwalbe, [phone]  S Name/Age/Gender Jenny Martin 77 y.o. female Room/Bed: MH08/MH08  Code Status   Code Status: Prior  Home/SNF/Other Home Patient oriented to: self, place, time, and situation Is this baseline? Yes   Triage Complete: Triage complete  Chief Complaint Chest pain [R07.9]  Triage Note The patient stated her blood pressure medication two weeks ago. She stated to hear her heart beat in her head and stated it has been beating really fast. Today she stated she was having trouble breathing.    Allergies Allergies  Allergen Reactions   Clonidine Derivatives Shortness Of Breath   Atorvastatin Other (See Comments)    Myalgias   Hydrochlorothiazide Other (See Comments)    It drys me out.   Losartan Other (See Comments)    Headaches    Micardis Hct [Telmisartan-Hctz] Swelling and Other (See Comments)    Ankles and legs swell   Pravastatin Other (See Comments)    Myalgias   Statins Other (See Comments)    Cramping and muscle pain   Telmisartan Hives   Valsartan-Hydrochlorothiazide Other (See Comments)    Made the chest burn   Ibuprofen Hives and Rash    Level of Care/Admitting Diagnosis ED Disposition     ED Disposition  Admit   Condition  --   Comment  Hospital Area: [facility] [[id number]]  Level of Care: Telemetry Cardiac [[id number]]  Interfacility transfer: Yes  May place patient in observation at [facility] or [facility] if equivalent level of care is available:: No  Covid Evaluation: Asymptomatic - no recent exposure (last 10 days) testing not required  Diagnosis: Chest pain [098119]  Admitting Physician: Gery Pray [[id number]]  Attending Physician: Alvester Chou          B Medical/Surgery History Past Medical History:  Diagnosis Date   Abnormal TSH 12/16/2015   Allergic rhinitis 12/10/2015   Anemia    Arachnoid cyst    Arcus senilis of both  eyes 12/10/2015   Atopic dermatitis    Blood transfusion without reported diagnosis    Cardiac murmur 12/07/2022   Carpal tunnel syndrome    Chronic vulvitis    Cortical age-related cataract of both eyes 04/27/2018   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   Edema 12/10/2015   Gastroesophageal reflux disease without esophagitis 06/08/2022   H/O bladder infections    History of chicken pox    HTN (hypertension), benign 01/03/2022   Hypercholesterolemia    Hyperlipidemia 12/10/2015   Hyperopia with astigmatism and presbyopia, bilateral 12/10/2015   Hypertension    Hypo-osmolality and hyponatremia 01/08/2022   Hypomagnesemia 01/03/2022   Hyponatremia 01/02/2022   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 04/27/2018   Mild intermittent asthma 12/16/2015   Morbid obesity (HCC) 12/07/2022   Need for prophylactic vaccination with Streptococcus pneumoniae (Pneumococcus) and Influenza vaccines 12/16/2015   Normocytic anemia 01/03/2022   Nuclear sclerotic cataract of both eyes 04/25/2017   Obese    Pneumonia, pneumococcal (HCC)    S/P thoracotomy    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   Uncontrolled hypertension 12/07/2022   Viral URI 12/27/2013   Vitreous floaters, bilateral 04/25/2017   Past Surgical History:  Procedure Laterality Date   COLPOSCOPY  12/2008   normal   WRIST SURGERY       A IV Location/Drains/Wounds Patient Lines/Drains/Airways Status     Active Line/Drains/Airways     Name Placement date Placement time Site  Days   Peripheral IV 06/11/23 20 G Left Antecubital 06/11/23  1456  Antecubital  less than 1            Intake/Output Last 24 hours No intake or output data in the 24 hours ending 06/11/23 2243  Labs/Imaging Results for orders placed or performed during the hospital encounter of 06/11/23 (from the past 48 hour(s))  Basic metabolic panel     Status: Abnormal   Collection Time: 06/11/23  2:56 PM  Result Value Ref Range   Sodium  133 (L) 135 - 145 mmol/L   Potassium 4.3 3.5 - 5.1 mmol/L   Chloride 94 (L) 98 - 111 mmol/L   CO2 27 22 - 32 mmol/L   Glucose, Bld 94 70 - 99 mg/dL    Comment: Glucose reference range applies only to samples taken after fasting for at least 8 hours.   BUN 31 (H) 8 - 23 mg/dL   Creatinine, Ser 7.82 (H) 0.44 - 1.00 mg/dL   Calcium 9.2 8.9 - 95.6 mg/dL   GFR, Estimated 44 (L) >60 mL/min    Comment: (NOTE) Calculated using the CKD-EPI Creatinine Equation (2021)    Anion gap 12 5 - 15    Comment: Performed at [facility], [address] [place], [place] [address]  CBC     Status: Abnormal   Collection Time: 06/11/23  2:56 PM  Result Value Ref Range   WBC 5.1 4.0 - 10.5 K/uL   RBC 2.88 (L) 3.87 - 5.11 MIL/uL   Hemoglobin 8.4 (L) 12.0 - 15.0 g/dL   HCT 65.7 (L) 84.6 - 96.2 %   MCV 89.2 80.0 - 100.0 fL   MCH 29.2 26.0 - 34.0 pg   MCHC 32.7 30.0 - 36.0 g/dL   RDW 95.2 84.1 - 32.4 %   Platelets 248 150 - 400 K/uL   nRBC 0.0 0.0 - 0.2 %    Comment: Performed at [facility], [address] [place], [place] [address]  Troponin I (High Sensitivity)     Status: Abnormal   Collection Time: 06/11/23  2:56 PM  Result Value Ref Range   Troponin I (High Sensitivity) 38 (H) <18 ng/L    Comment: (NOTE) Elevated high sensitivity troponin I (hsTnI) values and significant  changes across serial measurements may suggest ACS but many other  chronic and acute conditions are known to elevate hsTnI results.  Refer to the "Links" section for chest pain algorithms and additional  guidance. Performed at [facility], [address] [place], [place] [address]   Troponin I (High Sensitivity)     Status: Abnormal   Collection Time: 06/11/23  5:00 PM  Result Value Ref Range   Troponin I (High Sensitivity) 45 (H) <18 ng/L    Comment: (NOTE) Elevated high sensitivity troponin I (hsTnI) values and significant  changes across serial measurements may suggest ACS  but many other  chronic and acute conditions are known to elevate hsTnI results.  Refer to the "Links" section for chest pain algorithms and additional  guidance. Performed at [facility], [address] [place], [place] [address]    DG Chest 2 View  Result Date: 06/11/2023 CLINICAL DATA:  Shortness of breath EXAM: CHEST - 2 VIEW COMPARISON:  X-ray 12/27/2022 FINDINGS: Elevation of the left hemidiaphragm. Air-fluid level along the stomach. No consolidation, pneumothorax or effusion. Normal cardiopericardial silhouette without edema. Degenerative changes along the spine. IMPRESSION: Elevated left hemidiaphragm with  an air-fluid level of the stomach. No acute cardiopulmonary disease. Electronically Signed   By: Karen Kays M.D.   On: 06/11/2023 13:37    Pending Labs Unresulted Labs (From admission, onward)    None       Vitals/Pain Today's Vitals   06/11/23 1945 06/11/23 2100 06/11/23 2200 06/11/23 2230  BP: (!) 168/70 (!) 151/70 (!) 152/70 (!) 139/54  Pulse: 78 70 84 88  Resp:  15    Temp:      TempSrc:      SpO2: 99% 99% 98% 100%  Weight:      Height:      PainSc:        Isolation Precautions No active isolations  Medications Medications  albuterol (VENTOLIN HFA) 108 (90 Base) MCG/ACT inhaler 2 puff (2 puffs Inhalation Given 06/11/23 1902)  hydrALAZINE (APRESOLINE) tablet 25 mg (has no administration in time range)  chlorthalidone (HYGROTON) tablet 25 mg (has no administration in time range)  metoprolol tartrate (LOPRESSOR) injection 5 mg (has no administration in time range)  acetaminophen (TYLENOL) tablet 1,000 mg (1,000 mg Oral Given 06/11/23 1740)    Mobility walks     Focused Assessments Cardiac Assessment Handoff:  Cardiac Rhythm: Normal sinus rhythm Lab Results  Component Value Date   CKTOTAL 273 (H) 10/20/2016   TROPONINI <0.03 10/20/2016   Lab Results  Component Value Date   DDIMER 0.77 (H) 01/02/2022   Does the Patient currently  have chest pain? No    R Recommendations: See Admitting Provider Note  Report given to:   Additional Notes: None.

## 2023-06-11 NOTE — ED Notes (Signed)
[facility] called for transport. 

## 2023-06-11 NOTE — ED Notes (Signed)
Patient saying she fills more SHOB.  Spo2 100%, Resp WNL, BBS clear diminshed.  06/11/23 1903  Aerosol Therapy Tx  $ Hand Held Nebulizer  1  Medications Albuterol  Delivery Device MDI  Post-Treatment Pulse 74  Post-Treatment Respirations 14  Treatment Tolerance Tolerated well  Treatment Given 1  RT Breath Sounds  Bilateral Breath Sounds Clear  Oxygen Therapy/Pulse Ox  O2 Therapy Room air  SpO2 100 %

## 2023-06-11 NOTE — ED Provider Notes (Signed)
[place] [facility] Provider Note   CSN: [id number] Arrival date & time: 06/11/23  1243     History {Add pertinent medical, surgical, social history, OB history to HPI:1} Chief Complaint  Patient presents with   Tachycardia   Respiratory Distress    Jenny Martin is a 77 y.o. female with a past medical history of hypertension, hyperlipidemia presents today for evaluation of palpitation, shortness of breath and chest pain.  States she has had some shortness of breath and palpitations since yesterday.  States she feels like she could hear her heartbeat in her ear states she was recently placed on Zetia for hyperlipidemia and has been having some palpitations since.  She denies fever, cough, runny nose, abdominal pain, back pain, urinary symptoms, bowel change, rash.  HPI    Past Medical History:  Diagnosis Date   Abnormal TSH 12/16/2015   Allergic rhinitis 12/10/2015   Anemia    Arachnoid cyst    Arcus senilis of both eyes 12/10/2015   Atopic dermatitis    Blood transfusion without reported diagnosis    Cardiac murmur 12/07/2022   Carpal tunnel syndrome    Chronic vulvitis    Cortical age-related cataract of both eyes 04/27/2018   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   Edema 12/10/2015   Gastroesophageal reflux disease without esophagitis 06/08/2022   H/O bladder infections    History of chicken pox    HTN (hypertension), benign 01/03/2022   Hypercholesterolemia    Hyperlipidemia 12/10/2015   Hyperopia with astigmatism and presbyopia, bilateral 12/10/2015   Hypertension    Hypo-osmolality and hyponatremia 01/08/2022   Hypomagnesemia 01/03/2022   Hyponatremia 01/02/2022   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 04/27/2018   Mild intermittent asthma 12/16/2015   Morbid obesity (HCC) 12/07/2022   Need for prophylactic vaccination with Streptococcus pneumoniae (Pneumococcus) and Influenza vaccines  12/16/2015   Normocytic anemia 01/03/2022   Nuclear sclerotic cataract of both eyes 04/25/2017   Obese    Pneumonia, pneumococcal (HCC)    S/P thoracotomy    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 Authorizing Provider  albuterol (VENTOLIN HFA) 108 (90 Base) MCG/ACT inhaler Inhale 2 puffs into the lungs every 6 (six) hours as needed for shortness of breath or wheezing. 05/26/23   Alver Sorrow, NP  chlorthalidone (HYGROTON) 25 MG tablet Take 1 tablet (25 mg total) by mouth daily. 05/26/23   Alver Sorrow, NP  ezetimibe (ZETIA) 10 MG tablet Take 1 tablet (10 mg total) by mouth daily. 05/09/23   Alver Sorrow, NP  ferrous sulfate 325 (65 FE) MG tablet Take 1 tablet (325 mg total) by mouth every other day. 12/21/22   Revankar, Aundra Dubin, MD  furosemide (LASIX) 40 MG tablet Take 0.5 tablets (20 mg total) by mouth 2 (two) times daily as needed for edema or fluid. 05/10/23   Alver Sorrow, NP  hydrALAZINE (APRESOLINE) 25 MG tablet Take 1 tablet (25 mg total) by mouth in the morning and at bedtime. 05/31/23   Alver Sorrow, NP  Magnesium 500 MG TABS Take 1 tablet by mouth every other day.    [provider]  Multiple Vitamins-Minerals (CENTRUM MINIS ADULTS 50+) TABS Take 1 tablet by mouth daily in the afternoon.    [provider]  triamcinolone cream (KENALOG) 0.1 % Apply 1 Application topically 2 (two) times daily as needed (for atopic dermatitis- affected areas). 12/21/22   Revankar, Aundra Dubin, MD      Allergies    Clonidine derivatives, Atorvastatin, Hydrochlorothiazide, Losartan, Micardis hct [telmisartan-hctz], Pravastatin, Statins, Telmisartan, Valsartan-hydrochlorothiazide,  and Ibuprofen    Review of Systems   Review of Systems Negative except as per HPI.  Physical Exam Updated Vital Signs BP (!) 156/78 (BP Location: Left Arm)   Pulse (!) 103   Temp 98.2 F (36.8 C) (Oral)   Resp 18   Ht 5\' 5"  (1.651 m)   Wt 132 kg   SpO2 99%   BMI 48.43 kg/m  Physical Exam Vitals and nursing note reviewed.  Constitutional:      Appearance: Normal appearance.  HENT:     Head: Normocephalic and atraumatic.     Mouth/Throat:     Mouth: Mucous membranes are moist.  Eyes:     General: No scleral icterus. Cardiovascular:     Rate and Rhythm: Normal rate and regular rhythm.     Pulses: Normal pulses.     Heart sounds: Normal heart sounds.  Pulmonary:     Effort: Pulmonary effort is normal.     Breath sounds: Normal breath sounds.  Abdominal:     General: Abdomen is flat.     Palpations: Abdomen is soft.     Tenderness: There is no abdominal tenderness.  Musculoskeletal:        General: No deformity.  Skin:    General: Skin is warm.     Findings: No rash.  Neurological:     General: No focal deficit present.     Mental Status: She is alert.  Psychiatric:        Mood and Affect: Mood normal.     ED Results / Procedures / Treatments   Labs (all labs ordered are listed, but only abnormal results are displayed) Labs Reviewed - No data to display  EKG EKG Interpretation Date/Time:  Saturday June 11 2023 13:00:55 EDT Ventricular Rate:  101 PR Interval:  195 QRS Duration:  123 QT Interval:  378 QTC Calculation: 490 R Axis:   -21  Text Interpretation: Sinus tachycardia Left bundle branch block flipped t waves in I and aVL seen on prior though more pronounced today Otherwise no significant change Confirmed by Melene Plan [phone]) on 06/11/2023 1:05:06 PM  Radiology DG Chest 2 View  Result Date: 06/11/2023 CLINICAL DATA:  Shortness of breath EXAM: CHEST - 2 VIEW COMPARISON:  X-ray 12/27/2022 FINDINGS: Elevation of the left hemidiaphragm. Air-fluid  level along the stomach. No consolidation, pneumothorax or effusion. Normal cardiopericardial silhouette without edema. Degenerative changes along the spine. IMPRESSION: Elevated left hemidiaphragm with an air-fluid level of the stomach. No acute cardiopulmonary disease. Electronically Signed   By: Karen Kays M.D.   On: 06/11/2023 13:37    Procedures Procedures  {Document cardiac monitor, telemetry assessment procedure when appropriate:1}  Medications Ordered in ED Medications  albuterol (VENTOLIN HFA) 108 (90 Base) MCG/ACT inhaler 2 puff (has no administration in time range)    ED Course/ Medical Decision Making/ A&P   {   Click here for ABCD2, HEART and other calculatorsREFRESH Note before signing :1}  Medical Decision Making Amount and/or Complexity of Data Reviewed Labs: ordered. Radiology: ordered.  Risk Prescription drug management.   ***  {Document critical care time when appropriate:1} {Document review of labs and clinical decision tools ie heart score, Chads2Vasc2 etc:1}  {Document your independent review of radiology images, and any outside records:1} {Document your discussion with family members, caretakers, and with consultants:1} {Document social determinants of health affecting pt's care:1} {Document your decision making why or why not admission, treatments were needed:1} Final Clinical Impression(s) / ED Diagnoses Final diagnoses:  None    Rx / DC Orders ED Discharge Orders     None

## 2023-06-12 ENCOUNTER — Encounter (HOSPITAL_COMMUNITY): Payer: Self-pay | Admitting: Family Medicine

## 2023-06-12 DIAGNOSIS — N1832 Chronic kidney disease, stage 3b: Secondary | ICD-10-CM | POA: Diagnosis present

## 2023-06-12 DIAGNOSIS — I5032 Chronic diastolic (congestive) heart failure: Secondary | ICD-10-CM

## 2023-06-12 DIAGNOSIS — I447 Left bundle-branch block, unspecified: Secondary | ICD-10-CM

## 2023-06-12 DIAGNOSIS — I5030 Unspecified diastolic (congestive) heart failure: Secondary | ICD-10-CM | POA: Diagnosis not present

## 2023-06-12 DIAGNOSIS — I1A0 Resistant hypertension: Secondary | ICD-10-CM | POA: Diagnosis not present

## 2023-06-12 DIAGNOSIS — I10 Essential (primary) hypertension: Secondary | ICD-10-CM | POA: Diagnosis not present

## 2023-06-12 DIAGNOSIS — R002 Palpitations: Secondary | ICD-10-CM | POA: Diagnosis not present

## 2023-06-12 DIAGNOSIS — E876 Hypokalemia: Secondary | ICD-10-CM

## 2023-06-12 HISTORY — DX: Chronic kidney disease, stage 3b: N18.32

## 2023-06-12 HISTORY — DX: Chronic diastolic (congestive) heart failure: I50.32

## 2023-06-12 LAB — BASIC METABOLIC PANEL
Anion gap: 11 (ref 5–15)
BUN: 27 mg/dL — ABNORMAL HIGH (ref 8–23)
CO2: 26 mmol/L (ref 22–32)
Calcium: 8.4 mg/dL — ABNORMAL LOW (ref 8.9–10.3)
Chloride: 95 mmol/L — ABNORMAL LOW (ref 98–111)
Creatinine, Ser: 1.42 mg/dL — ABNORMAL HIGH (ref 0.44–1.00)
GFR, Estimated: 39 mL/min — ABNORMAL LOW (ref >60)
Glucose, Bld: 96 mg/dL (ref 70–99)
Potassium: 3.4 mmol/L — ABNORMAL LOW (ref 3.5–5.1)
Sodium: 132 mmol/L — ABNORMAL LOW (ref 135–145)

## 2023-06-12 LAB — IRON AND TIBC
Iron: 36 ug/dL (ref 28–170)
Saturation Ratios: 13 % (ref 10.4–31.8)
TIBC: 286 ug/dL (ref 250–450)
UIBC: 250 ug/dL

## 2023-06-12 LAB — CBC
HCT: 22.5 % — ABNORMAL LOW (ref 36.0–46.0)
Hemoglobin: 7.4 g/dL — ABNORMAL LOW (ref 12.0–15.0)
MCH: 28.7 pg (ref 26.0–34.0)
MCHC: 32.9 g/dL (ref 30.0–36.0)
MCV: 87.2 fL (ref 80.0–100.0)
Platelets: 243 10*3/uL (ref 150–400)
RBC: 2.58 MIL/uL — ABNORMAL LOW (ref 3.87–5.11)
RDW: 13.4 % (ref 11.5–15.5)
WBC: 4.6 10*3/uL (ref 4.0–10.5)
nRBC: 0 % (ref 0.0–0.2)

## 2023-06-12 LAB — FERRITIN: Ferritin: 18 ng/mL (ref 11–307)

## 2023-06-12 LAB — TROPONIN I (HIGH SENSITIVITY): Troponin I (High Sensitivity): 32 ng/L — ABNORMAL HIGH (ref <18)

## 2023-06-12 LAB — VITAMIN B12: Vitamin B-12: 603 pg/mL (ref 180–914)

## 2023-06-12 LAB — RETICULOCYTES
Immature Retic Fract: 6.4 % (ref 2.3–15.9)
RBC.: 2.5 MIL/uL — ABNORMAL LOW (ref 3.87–5.11)
Retic Count, Absolute: 31.3 10*3/uL (ref 19.0–186.0)
Retic Ct Pct: 1.3 % (ref 0.4–3.1)

## 2023-06-12 LAB — MAGNESIUM: Magnesium: 2.2 mg/dL (ref 1.7–2.4)

## 2023-06-12 LAB — FOLATE: Folate: 20.7 ng/mL (ref >5.9)

## 2023-06-12 MED ORDER — POTASSIUM CHLORIDE CRYS ER 20 MEQ PO TBCR
40.0000 meq | EXTENDED_RELEASE_TABLET | Freq: Once | ORAL | Status: AC
  Administered 2023-06-12: 40 meq via ORAL
  Filled 2023-06-12: qty 2

## 2023-06-12 MED ORDER — LABETALOL HCL 5 MG/ML IV SOLN: 10.0000 mg | INTRAVENOUS | Status: DC | PRN

## 2023-06-12 MED ORDER — HEPARIN SODIUM (PORCINE) 5000 UNIT/ML IJ SOLN
5000.0000 [IU] | Freq: Three times a day (TID) | INTRAMUSCULAR | Status: DC
  Administered 2023-06-12: 5000 [IU] via SUBCUTANEOUS
  Filled 2023-06-12 (×2): qty 1

## 2023-06-12 MED ORDER — ACETAMINOPHEN 650 MG RE SUPP: 650.0000 mg | Freq: Four times a day (QID) | RECTAL | Status: DC | PRN

## 2023-06-12 MED ORDER — BISACODYL 10 MG RE SUPP
10.0000 mg | Freq: Once | RECTAL | Status: AC
  Administered 2023-06-12: 10 mg via RECTAL
  Filled 2023-06-12: qty 1

## 2023-06-12 MED ORDER — POTASSIUM CHLORIDE CRYS ER 20 MEQ PO TBCR
40.0000 meq | EXTENDED_RELEASE_TABLET | ORAL | Status: AC
  Administered 2023-06-12 (×2): 40 meq via ORAL
  Filled 2023-06-12 (×2): qty 2

## 2023-06-12 MED ORDER — ALBUTEROL SULFATE (2.5 MG/3ML) 0.083% IN NEBU: 2.5000 mg | INHALATION_SOLUTION | Freq: Four times a day (QID) | RESPIRATORY_TRACT | Status: DC | PRN

## 2023-06-12 MED ORDER — SENNOSIDES-DOCUSATE SODIUM 8.6-50 MG PO TABS
1.0000 | ORAL_TABLET | Freq: Every evening | ORAL | Status: DC | PRN
  Administered 2023-06-12: 1 via ORAL
  Filled 2023-06-12: qty 1

## 2023-06-12 MED ORDER — ACETAMINOPHEN 325 MG PO TABS: 650.0000 mg | ORAL_TABLET | Freq: Four times a day (QID) | ORAL | Status: DC | PRN

## 2023-06-12 MED ORDER — OXYCODONE HCL 5 MG PO TABS: 5.0000 mg | ORAL_TABLET | ORAL | Status: DC | PRN

## 2023-06-12 MED ORDER — SODIUM CHLORIDE 0.9% FLUSH
3.0000 mL | Freq: Two times a day (BID) | INTRAVENOUS | Status: DC
  Administered 2023-06-12 – 2023-06-13 (×2): 3 mL via INTRAVENOUS

## 2023-06-12 MED ORDER — ENSURE ENLIVE PO LIQD: 237.0000 mL | Freq: Two times a day (BID) | ORAL | Status: DC

## 2023-06-12 NOTE — Progress Notes (Signed)
Pt hgb this am 7.4, K 3.4. VSS.  Dr. Joneen Roach notified.

## 2023-06-12 NOTE — H&P (Signed)
History and Physical    NOHELI BOETTNER NWG:[id number] DOB: 01-Mar-1946 DOA: 06/11/2023  PCP: Clayborne Dana, NP   Patient coming from: Home   Chief Complaint: Palpitations   HPI: BONNIE VELADOR is a 77 y.o. female with medical history significant for hypertension, CKD 3B, chronic HFpEF, anemia, and asthma who presents for evaluation of palpitations.  Patient reports 3 to 4 days of intermittent palpitations that became severe today.  She feels as though her heart is pounding and going too fast during these episodes.  She had a severe episode today and felt as though she was having trouble catching her breath.  She denies any chest pain, nausea, cough, or shortness of breath apart from these episodes.  She has had leg swelling previously but not now.  She was taken off of clonidine and Norvasc, and started on chlorthalidone and hydralazine earlier this month.  [facility] ED Course: Upon arrival to the ED, patient is found to be afebrile and saturating well on room air with slightly elevated heart rate and stable blood pressure.  EKG demonstrates sinus tachycardia with rate 101 and LBBB.  Chest x-ray is negative for acute findings.  Labs are most notable for hemoglobin 8.4, troponin 38 and then 45, and creatinine 1.28.  Cardiology was consulted by the ED PA and recommended medical admission to [facility].  Review of Systems:  All other systems reviewed and apart from HPI, are negative.  Past Medical History:  Diagnosis Date   Abnormal TSH 12/16/2015   Allergic rhinitis 12/10/2015   Anemia    Arachnoid cyst    Arcus senilis of both eyes 12/10/2015   Atopic dermatitis    Blood transfusion without reported diagnosis    Cardiac murmur 12/07/2022   Carpal tunnel syndrome    Chronic vulvitis    Cortical age-related cataract of both eyes 04/27/2018   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   Edema 12/10/2015   Gastroesophageal reflux disease without esophagitis 06/08/2022   H/O bladder  infections    History of chicken pox    HTN (hypertension), benign 01/03/2022   Hypercholesterolemia    Hyperlipidemia 12/10/2015   Hyperopia with astigmatism and presbyopia, bilateral 12/10/2015   Hypertension    Hypo-osmolality and hyponatremia 01/08/2022   Hypomagnesemia 01/03/2022   Hyponatremia 01/02/2022   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 04/27/2018   Mild intermittent asthma 12/16/2015   Morbid obesity (HCC) 12/07/2022   Need for prophylactic vaccination with Streptococcus pneumoniae (Pneumococcus) and Influenza vaccines 12/16/2015   Normocytic anemia 01/03/2022   Nuclear sclerotic cataract of both eyes 04/25/2017   Obese    Pneumonia, pneumococcal (HCC)    S/P thoracotomy    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 She has never used smokeless tobacco. She reports that she does not drink alcohol and does not use drugs.  Allergies  Allergen Reactions   Clonidine Derivatives Shortness Of Breath   Atorvastatin Other (See Comments)    Myalgias   Hydrochlorothiazide Other (See Comments)    It drys me out.   Losartan Other (See Comments)    Headaches    Micardis Hct [Telmisartan-Hctz] Swelling and Other (See Comments)    Ankles and legs swell  Pravastatin Other (See Comments)    Myalgias   Statins Other (See Comments)    Cramping and muscle pain   Telmisartan Hives   Valsartan-Hydrochlorothiazide Other (See Comments)    Made the chest burn   Ibuprofen Hives and Rash    Family History  Problem Relation Age of Onset   COPD Mother    Asthma Mother    Hypertension Father    Stroke Father    Cancer Maternal Grandmother      Prior to Admission medications   Medication Sig  Start Date End Date Taking? Authorizing Provider  albuterol (VENTOLIN HFA) 108 (90 Base) MCG/ACT inhaler Inhale 2 puffs into the lungs every 6 (six) hours as needed for shortness of breath or wheezing. 05/26/23   Alver Sorrow, NP  chlorthalidone (HYGROTON) 25 MG tablet Take 1 tablet (25 mg total) by mouth daily. 05/26/23   Alver Sorrow, NP  ezetimibe (ZETIA) 10 MG tablet Take 1 tablet (10 mg total) by mouth daily. 05/09/23   Alver Sorrow, NP  ferrous sulfate 325 (65 FE) MG tablet Take 1 tablet (325 mg total) by mouth every other day. 12/21/22   Revankar, Aundra Dubin, MD  furosemide (LASIX) 40 MG tablet Take 0.5 tablets (20 mg total) by mouth 2 (two) times daily as needed for edema or fluid. 05/10/23   Alver Sorrow, NP  hydrALAZINE (APRESOLINE) 25 MG tablet Take 1 tablet (25 mg total) by mouth in the morning and at bedtime. 05/31/23   Alver Sorrow, NP  Magnesium 500 MG TABS Take 1 tablet by mouth every other day.    [provider]  Multiple Vitamins-Minerals (CENTRUM MINIS ADULTS 50+) TABS Take 1 tablet by mouth daily in the afternoon.    [provider]  triamcinolone cream (KENALOG) 0.1 % Apply 1 Application topically 2 (two) times daily as needed (for atopic dermatitis- affected areas). 12/21/22   RevankarAundra Dubin, MD    Physical Exam: Vitals:   06/11/23 2245 06/11/23 2346 06/11/23 2350 06/12/23 0018  BP: (!) 136/55 (!) 165/95  (!) 142/77  Pulse: 88 95 81 81  Resp:  16    Temp:  98.5 F (36.9 C) 98.5 F (36.9 C)   TempSrc:  Oral Oral   SpO2: 100% 99% 99% 100%  Weight:  123.1 kg    Height:  5\' 5"  (1.651 m)       Constitutional: NAD, calm  Eyes: PERTLA, lids and conjunctivae normal ENMT: Mucous membranes are moist. Posterior pharynx clear of any exudate or lesions.   Neck: supple, no masses  Respiratory: no wheezing, no crackles. No accessory muscle use.  Cardiovascular: S1 & S2 heard, regular rate and rhythm. No JVD. Abdomen: No distension, no  tenderness, soft. Bowel sounds active.  Musculoskeletal: no clubbing / cyanosis. No joint deformity upper and lower extremities.   Skin: no significant rashes, lesions, ulcers. Warm, dry, well-perfused. Neurologic: CN 2-12 grossly intact. Moving all extremities. Alert and oriented.  Psychiatric: Pleasant. Cooperative.    Labs and Imaging on Admission: I have personally reviewed following labs and imaging studies  CBC: Recent Labs  Lab 06/11/23 1456  WBC 5.1  HGB 8.4*  HCT 25.7*  MCV 89.2  PLT 248   Basic Metabolic Panel: Recent Labs  Lab 06/09/23 1420 06/11/23 1456  NA 135 133*  K 4.0 4.3  CL 94* 94*  CO2 29 27  GLUCOSE 96 94  BUN 27 31*  CREATININE 1.39* 1.28*  CALCIUM 9.3 9.2  GFR: Estimated Creatinine Clearance: 50 mL/min (A) (by C-G formula based on SCr of 1.28 mg/dL (H)). Liver Function Tests: No results for input(s): "AST", "ALT", "ALKPHOS", "BILITOT", "PROT", "ALBUMIN" in the last 168 hours. No results for input(s): "LIPASE", "AMYLASE" in the last 168 hours. No results for input(s): "AMMONIA" in the last 168 hours. Coagulation Profile: No results for input(s): "INR", "PROTIME" in the last 168 hours. Cardiac Enzymes: No results for input(s): "CKTOTAL", "CKMB", "CKMBINDEX", "TROPONINI" in the last 168 hours. BNP (last 3 results) No results for input(s): "PROBNP" in the last 8760 hours. HbA1C: No results for input(s): "HGBA1C" in the last 72 hours. CBG: No results for input(s): "GLUCAP" in the last 168 hours. Lipid Profile: No results for input(s): "CHOL", "HDL", "LDLCALC", "TRIG", "CHOLHDL", "LDLDIRECT" in the last 72 hours. Thyroid Function Tests: No results for input(s): "TSH", "T4TOTAL", "FREET4", "T3FREE", "THYROIDAB" in the last 72 hours. Anemia Panel: No results for input(s): "VITAMINB12", "FOLATE", "FERRITIN", "TIBC", "IRON", "RETICCTPCT" in the last 72 hours. Urine analysis:    Component Value Date/Time   COLORURINE YELLOW 12/02/2022 1441    APPEARANCEUR CLEAR 12/02/2022 1441   LABSPEC 1.015 12/02/2022 1441   PHURINE 6.5 12/02/2022 1441   GLUCOSEU NEGATIVE 12/02/2022 1441   HGBUR LARGE (A) 12/02/2022 1441   BILIRUBINUR NEGATIVE 12/02/2022 1441   KETONESUR NEGATIVE 12/02/2022 1441   PROTEINUR >=300 (A) 12/02/2022 1441   NITRITE NEGATIVE 12/02/2022 1441   LEUKOCYTESUR TRACE (A) 12/02/2022 1441   Sepsis Labs: @LABRCNTIP (procalcitonin:4,lacticidven:4) )No results found for this or any previous visit (from the past 240 hour(s)).   Radiological Exams on Admission: DG Chest 2 View  Result Date: 06/11/2023 CLINICAL DATA:  Shortness of breath EXAM: CHEST - 2 VIEW COMPARISON:  X-ray 12/27/2022 FINDINGS: Elevation of the left hemidiaphragm. Air-fluid level along the stomach. No consolidation, pneumothorax or effusion. Normal cardiopericardial silhouette without edema. Degenerative changes along the spine. IMPRESSION: Elevated left hemidiaphragm with an air-fluid level of the stomach. No acute cardiopulmonary disease. Electronically Signed   By: Karen Kays M.D.   On: 06/11/2023 13:37    EKG: Independently reviewed. Sinus tachycardia, rate 101, LBBB.   Assessment/Plan   1. Palpitations; LBBB - Presents for eval of palpitations and found to have mild troponin elevation and LBBB that appears to be new  - No anginal symptoms  - She recently started hydralazine  - Continue cardiac monitoring, trend troponin, hold hydralazine for now and follow-up on recommendations from cardiology    2. Chronic HFpEF  - Appears compensated  - Continue chlorthalidone, monitor weight    3. Hypertension  - Continue chlorthalidone    4. CKD 3B  - SCr is 1.28 on admission, appears close to baseline  - Renally-dose medications, monitor    5. Asthma  - Not in exacerbation on admission  - Continue albuterol as needed   6. Anemia  - Hgb is 8.4 on admission, down from 10.1 in February  - She denies melena or hematochezia  - Check anemia panel,  repeat CBC in am     DVT prophylaxis: sq heparin  Code Status: Full  Level of Care: Level of care: Telemetry Cardiac Family Communication: None present  Disposition Plan:  Patient is from: Home  Anticipated d/c is to: home  Anticipated d/c date is: 06/12/23  Patient currently: Pending cardiology consultation  Consults called: Cardiology consulted by ED PA  Admission status: Observation     Briscoe Deutscher, MD [facility]  06/12/2023, 2:02 AM

## 2023-06-12 NOTE — Progress Notes (Signed)
PROGRESS NOTE    Jenny Martin  ZOX:[id number] DOB: 09/15/1946 DOA: 06/11/2023 PCP: Clayborne Dana, NP    Brief Narrative:    Jenny Martin is a 77 y.o. female with medical history significant for hypertension, CKD 3b, chronic HFpEF, anemia, and asthma presented to hospital with palpitations for 3 to 4 days with difficulty breathing.  She was recently taken off clonidine and Norvasc was started on chlorthalidone and hydralazine earlier this month.  In the ED, patient had tachycardia with a rate of 101 with LBBB.  Chest x-ray was negative for acute findings.  Labs showed hemoglobin of 9.4 with troponin of 38 and 45 with creatinine of 1.2.  Cardiology was consulted from the ED and was admitted hospital for further evaluation and treatment   Assessment and plan.  Palpitation/History of LBBB. Presenting with elevated troponins as well without any chest pain.  Cardiology was consulted.  Cardiology recommends a further monitoring in the hospital.  So far sinus tachycardia with LBBB which is new.  Patient does have multiple intolerances to medication.  Chest x-ray with elevated left hemidiaphragm but no infiltrate.  Patient did have a recent 2D echocardiogram on 12/22/2022 with LV ejection fraction of 65% with grade 1 diastolic dysfunction.  Will follow cardiology recommendations..    Chronic HFpEF  History appears compensated, Continue chlorthalidone, monitor intake and output charting, weights.  Check BMP in AM.  Mild hypokalemia.  Will aggressively replace.  Patient is on chlorthalidone.  Check BMP in AM.   Hypertension  On chlorthalidone which was continued.  Will monitor BMP closely. Latest blood pressure of 155/72.     CKD 3b  - SCr 1.28 on admission, likely at baseline.  Will continue to monitor closely while in the hospital.  Check BMP in AM.    Asthma  Continue albuterol.  Not in exacerbation.   Anemia  Likely anemia of chronic disease.  Hgb 8.4 on admission, down from 10.1 in  February.  Repeat hemoglobin of 7.4.  No mention of external blood loss.  Ferritin level at 18, iron of 36, folic acid 20, vitamin B12 at 603.  Occult blood pending.  Patient would likely benefit from iron supplementation.     DVT prophylaxis: heparin injection 5,000 Units Start: 06/12/23 1400   Code Status:     Code Status: Full Code  Disposition: Home likely in 1 to 2 days  Status is: Observation  The patient will require care spanning > 2 midnights and should be moved to inpatient because: Need for further monitoring and cardiology follow-up   Family Communication:  Spoke with the patient's son at bedside.  Consultants:  Cardiology  Procedures:  None  Antimicrobials:  None  Anti-infectives (From admission, onward)    None      Subjective: Today, patient was seen and examined at bedside.  Complains of palpitation, not had a bowel movement.  States that she does have some shortness of breath on and off with chest discomfort.  Objective: Vitals:   06/12/23 0400 06/12/23 0738 06/12/23 0743 06/12/23 1136  BP: (!) 148/68 (!) 153/70 (!) 153/70 (!) 155/72  Pulse: 76  76   Resp: 18 17  16   Temp: 98.1 F (36.7 C) 98.4 F (36.9 C)  98.7 F (37.1 C)  TempSrc: Oral Oral  Oral  SpO2: 100%  99%   Weight:      Height:        Intake/Output Summary (Last 24 hours) at 06/12/2023 1549 Last data filed at  06/12/2023 0830 Gross per 24 hour  Intake 700 ml  Output --  Net 700 ml   Filed Weights   06/11/23 1256 06/11/23 2346  Weight: 132 kg 123.1 kg    Physical Examination: Body mass index is 45.16 kg/m.   General: Obese built, not in obvious distress HENT:   No scleral pallor or icterus noted. Oral mucosa is moist.  Chest:  Clear breath sounds.  Diminished breath sounds bilaterally. No crackles or wheezes.  CVS: S1 &S2 heard. No murmur.  Tachycardia, Abdomen: Soft, nontender, nondistended.  Bowel sounds are heard.   Extremities: No cyanosis, clubbing or edema.   Peripheral pulses are palpable. Psych: Alert, awake and oriented, normal mood CNS:  No cranial nerve deficits.  Power equal in all extremities.   Skin: Warm and dry.  No rashes noted.  Data Reviewed:   CBC: Recent Labs  Lab 06/11/23 1456 06/12/23 0354  WBC 5.1 4.6  HGB 8.4* 7.4*  HCT 25.7* 22.5*  MCV 89.2 87.2  PLT 248 243    Basic Metabolic Panel: Recent Labs  Lab 06/09/23 1420 06/11/23 1456 06/12/23 0354  NA 135 133* 132*  K 4.0 4.3 3.4*  CL 94* 94* 95*  CO2 29 27 26   GLUCOSE 96 94 96  BUN 27 31* 27*  CREATININE 1.39* 1.28* 1.42*  CALCIUM 9.3 9.2 8.4*  MG  --   --  2.2    Liver Function Tests: No results for input(s): "AST", "ALT", "ALKPHOS", "BILITOT", "PROT", "ALBUMIN" in the last 168 hours.   Radiology Studies: DG Chest 2 View  Result Date: 06/11/2023 CLINICAL DATA:  Shortness of breath EXAM: CHEST - 2 VIEW COMPARISON:  X-ray 12/27/2022 FINDINGS: Elevation of the left hemidiaphragm. Air-fluid level along the stomach. No consolidation, pneumothorax or effusion. Normal cardiopericardial silhouette without edema. Degenerative changes along the spine. IMPRESSION: Elevated left hemidiaphragm with an air-fluid level of the stomach. No acute cardiopulmonary disease. Electronically Signed   By: Karen Kays M.D.   On: 06/11/2023 13:37      LOS: 0 days    Joycelyn Das, MD [facility] Available via Epic secure chat 7am-7pm After these hours, please refer to coverage provider listed on [email or web address] 06/12/2023, 3:49 PM

## 2023-06-12 NOTE — Consult Note (Addendum)
Cardiology Consultation   Patient ID: Jenny Martin MRN: [id number]; DOB: January 17, 1946  Admit date: 06/11/2023 Date of Consult: 06/12/2023  PCP:  Clayborne Dana, NP   [facility] Providers Cardiologist:  Jenny Si, MD        Patient Profile:   Jenny Martin is a 77 y.o. female with a hx of HTN, HLD, anemia, GERD, asthma, PNA, carpal tunnel syndrome, obesity, HFpEF and intolerance to medications for HTN, who is being seen 06/12/2023 for the evaluation of palpitations and dyspnea at the request of Dr Jenny Martin.  History of Present Illness:   [name] and difficulty managing her HTN.  Seen in office 05/26/23 and clonidine and amlodipine were stopped.  Chlorthalidone 25 mg daily started and hydralazine prn.  Also Future considerations include Doxazosin (Has been previously prescribed but never taken) Renal denervation could be considered in the future. She has had intolerance to spironolactone.   Echo from 12/22/22 with EF 60-^%%, mild LVH.  G1DD.  RV is normal.  Mild MR, fairly normal Echo.  Renal dopplers with no RA stenosis.    Since medication changes she has noticed more dyspnea and increased HR. EKG with ST and LBBB  yesterday her heart was racing and regular, she felt at one point she would pass out.  She could hear her heart beat in her ears.  She was concerned this had not happened before so she called EMS.  Though she ended up driving her self to med [facility].  No chest pain but uncomfortable in her chest from rapid HR.  Marland Kitchen   Her BP was 156/78 P 103 afebrile sp02 at 99% RA.    LABS Na 132, K+ 3.4 Cr 1.42 Mg+ 2.2  Hs troponin 38>>45>>32 WBC 4.6 and on admit HGB 8.4 today 7.4  plts 243.  Iron 36 UIBC 250 TIBC 286 Ferritin 18 folate 20.7   2V CXR MPRESSION: Elevated left hemidiaphragm with an air-fluid level of the stomach. No acute cardiopulmonary disease.  BP now 155/72 P 78 R 16  afebrile.   Maybe a few beats here but nothing like yesterday.    Complains of constipation today  Past Medical History:  Diagnosis Date   Abnormal TSH 12/16/2015   Allergic rhinitis 12/10/2015   Anemia    Arachnoid cyst    Arcus senilis of both eyes 12/10/2015   Atopic dermatitis    Blood transfusion without reported diagnosis    Cardiac murmur 12/07/2022   Carpal tunnel syndrome    Chronic vulvitis    Cortical age-related cataract of both eyes 04/27/2018   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   Edema 12/10/2015   Gastroesophageal reflux disease without esophagitis 06/08/2022   H/O bladder infections    History of chicken pox    HTN (hypertension), benign 01/03/2022   Hypercholesterolemia    Hyperlipidemia 12/10/2015   Hyperopia with astigmatism and presbyopia, bilateral 12/10/2015   Hypertension    Hypo-osmolality and hyponatremia 01/08/2022   Hypomagnesemia 01/03/2022   Hyponatremia 01/02/2022   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 04/27/2018   Mild intermittent asthma 12/16/2015   Morbid obesity (HCC) 12/07/2022   Need for prophylactic vaccination with Streptococcus pneumoniae (Pneumococcus) and Influenza vaccines 12/16/2015   Normocytic anemia 01/03/2022   Nuclear sclerotic cataract of both eyes 04/25/2017   Obese    Pneumonia, pneumococcal (HCC)    S/P thoracotomy    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 Authorizing Provider  albuterol (VENTOLIN HFA) 108 (90 Base) MCG/ACT inhaler Inhale 2 puffs into the lungs every 6 (six) hours as needed for shortness of breath or wheezing. 05/26/23   Alver Sorrow, NP  chlorthalidone (HYGROTON) 25 MG tablet Take 1 tablet (25 mg total) by mouth daily. 05/26/23    Alver Sorrow, NP  ezetimibe (ZETIA) 10 MG tablet Take 1 tablet (10 mg total) by mouth daily. 05/09/23   Alver Sorrow, NP  ferrous sulfate 325 (65 FE) MG tablet Take 1 tablet (325 mg total) by mouth every other day. 12/21/22   Revankar, Aundra Dubin, MD  furosemide (LASIX) 40 MG tablet Take 0.5 tablets (20 mg total) by mouth 2 (two) times daily as needed for edema or fluid. 05/10/23   Alver Sorrow, NP  hydrALAZINE (APRESOLINE) 25 MG tablet Take 1 tablet (25 mg total) by mouth in the morning and at bedtime. 05/31/23   Alver Sorrow, NP  Magnesium 500 MG TABS Take 1 tablet by mouth every other day.    [provider]  Multiple Vitamins-Minerals (CENTRUM MINIS ADULTS 50+) TABS Take 1 tablet by mouth daily in the afternoon.    [provider]  triamcinolone cream (KENALOG) 0.1 % Apply 1 Application topically 2 (two) times daily as needed (for atopic dermatitis- affected areas). 12/21/22   Revankar, Aundra Dubin, MD    Inpatient Medications: Scheduled Meds:  chlorthalidone  25 mg Oral Daily   heparin  5,000 Units Subcutaneous Q8H   sodium chloride flush  3 mL Intravenous Q12H   Continuous Infusions:  PRN Meds: acetaminophen **OR** acetaminophen, albuterol, labetalol, oxyCODONE, senna-docusate  Allergies:    Allergies  Allergen Reactions   Clonidine Derivatives Shortness Of Breath   Hydralazine Shortness Of Breath   Atorvastatin Other (See Comments)    Myalgias   Hydrochlorothiazide Other (See Comments)    It drys me out.   Losartan Other (See Comments)    Headaches    Micardis Hct [Telmisartan-Hctz] Swelling and Other (See Comments)    Ankles and legs swell   Pravastatin Other (See Comments)    Myalgias   Statins Other (See Comments)    Cramping and muscle pain   Telmisartan Hives   Valsartan-Hydrochlorothiazide Other (See Comments)    Made the chest burn   Ibuprofen Hives and Rash    Social History:   Social History   Socioeconomic History   Marital  status: Divorced    Spouse name: Not on file   Number of children: Not on file   Years of education: Not on file   Highest education level: Not on file  Occupational History   Not on file  Tobacco Use   Smoking status: Never   Smokeless tobacco: Never  Vaping Use   Vaping status: Never Used  Substance and Sexual Activity   Alcohol use: No   Drug use: No   Sexual activity: Never  Other Topics Concern   Not on file  Social History Narrative   Not on file   Social Determinants of Health   Financial Resource Strain: Not on file  Food Insecurity: No Food Insecurity (06/11/2023)   Hunger Vital Sign    Worried  About Running Out of Food in the Last Year: Never true    Ran Out of Food in the Last Year: Never true  Transportation Needs: No Transportation Needs (06/11/2023)   PRAPARE - Administrator, Civil Service (Medical): No    Lack of Transportation (Non-Medical): No  Physical Activity: Insufficiently Active (02/02/2023)   Exercise Vital Sign    Days of Exercise per Week: 5 days    Minutes of Exercise per Session: 20 min  Stress: Not on file  Social Connections: Not on file  Intimate Partner Violence: Not At Risk (06/11/2023)   Humiliation, Afraid, Rape, and Kick questionnaire    Fear of Current or Ex-Partner: No    Emotionally Abused: No    Physically Abused: No    Sexually Abused: No    Family History:    Family History  Problem Relation Age of Onset   COPD Mother    Asthma Mother    Hypertension Father    Stroke Father    Cancer Maternal Grandmother      ROS:  Please see the history of present illness.  General:no colds or fevers, no weight changes Skin:no rashes or ulcers HEENT:no blurred vision, no congestion CV:see HPI PUL:see HPI GI:no diarrhea constipation or melena, no indigestion GU:no hematuria, no dysuria MS:no joint pain, no claudication Neuro:no syncope, no lightheadedness Endo:no diabetes, no thyroid disease  All other ROS reviewed  and negative.     Physical Exam/Data:   Vitals:   06/12/23 0400 06/12/23 0738 06/12/23 0743 06/12/23 1136  BP: (!) 148/68 (!) 153/70 (!) 153/70 (!) 155/72  Pulse: 76  76   Resp: 18 17  16   Temp: 98.1 F (36.7 C) 98.4 F (36.9 C)  98.7 F (37.1 C)  TempSrc: Oral Oral  Oral  SpO2: 100%  99%   Weight:      Height:        Intake/Output Summary (Last 24 hours) at 06/12/2023 1204 Last data filed at 06/12/2023 0830 Gross per 24 hour  Intake 700 ml  Output --  Net 700 ml      06/11/2023   11:46 PM 06/11/2023   12:56 PM 05/26/2023    2:06 PM  Last 3 Weights  Weight (lbs) 271 lb 6.4 oz 291 lb 0.1 oz 293 lb  Weight (kg) 123.106 kg 132 kg 132.904 kg     Body mass index is 45.16 kg/m.  General:  Well nourished, well developed, in no acute distress HEENT: normal Neck: no JVD Vascular: No carotid bruits; Distal pulses 2+ bilaterally Cardiac:  normal S1, S2; RRR; 2/6 systolic murmur  no gallup rub or click Lungs:  clear to auscultation bilaterally, no wheezing, rhonchi or rales  Abd: soft, nontender, no hepatomegaly  Ext: 1+ lower ext edema Musculoskeletal:  No deformities, BUE and BLE strength normal and equal Skin: warm and dry  Neuro:  alert and oreinted X 3 MAE follows commands no focal abnormalities noted Psych:  Normal affect   EKG:  The EKG was personally reviewed and demonstrates:  ST 101 and no acute ST changes but new LBBB Telemetry:  Telemetry was personally reviewed and demonstrates:  SR to slight ST  Relevant CV Studies: Echo 12/22/22 IMPRESSIONS     1. GLS -19.4. Left ventricular ejection fraction, by estimation, is 60 to  65%. The left ventricle has normal function. The left ventricle has no  regional wall motion abnormalities. There is mild left ventricular  hypertrophy. Left ventricular diastolic  parameters are  consistent with Grade I diastolic dysfunction (impaired  relaxation).   2. Right ventricular systolic function is normal. The right ventricular   size is normal.   3. The mitral valve is normal in structure. Mild mitral valve  regurgitation. No evidence of mitral stenosis.   4. The aortic valve is normal in structure. Aortic valve regurgitation is  not visualized. No aortic stenosis is present.   5. The inferior vena cava is normal in size with greater than 50%  respiratory variability, suggesting right atrial pressure of 3 mmHg.   FINDINGS   Left Ventricle: GLS -19.4. Left ventricular ejection fraction, by  estimation, is 60 to 65%. The left ventricle has normal function. The left  ventricle has no regional wall motion abnormalities. The left ventricular  internal cavity size was normal in  size. There is mild left ventricular hypertrophy. Left ventricular  diastolic parameters are consistent with Grade I diastolic dysfunction  (impaired relaxation).   Right Ventricle: The right ventricular size is normal. No increase in  right ventricular wall thickness. Right ventricular systolic function is  normal.   Left Atrium: Left atrial size was normal in size.   Right Atrium: Right atrial size was normal in size.   Pericardium: There is no evidence of pericardial effusion.   Mitral Valve: The mitral valve is normal in structure. Mild mitral valve  regurgitation. No evidence of mitral valve stenosis.   Tricuspid Valve: The tricuspid valve is normal in structure. Tricuspid  valve regurgitation is not demonstrated. No evidence of tricuspid  stenosis.   Aortic Valve: The aortic valve is normal in structure. Aortic valve  regurgitation is not visualized. No aortic stenosis is present.   Pulmonic Valve: The pulmonic valve was normal in structure. Pulmonic valve  regurgitation is mild. No evidence of pulmonic stenosis.   Aorta: The aortic root is normal in size and structure.   Venous: The inferior vena cava is normal in size with greater than 50%  respiratory variability, suggesting right atrial pressure of 3 mmHg.    IAS/Shunts: No atrial level shunt detected by color flow Doppler.    Laboratory Data:  High Sensitivity Troponin:   Recent Labs  Lab 06/11/23 1456 06/11/23 1700 06/12/23 0630  TROPONINIHS 38* 45* 32*     Chemistry Recent Labs  Lab 06/09/23 1420 06/11/23 1456 06/12/23 0354  NA 135 133* 132*  K 4.0 4.3 3.4*  CL 94* 94* 95*  CO2 29 27 26   GLUCOSE 96 94 96  BUN 27 31* 27*  CREATININE 1.39* 1.28* 1.42*  CALCIUM 9.3 9.2 8.4*  MG  --   --  2.2  GFRNONAA  --  44* 39*  ANIONGAP  --  12 11    No results for input(s): "PROT", "ALBUMIN", "AST", "ALT", "ALKPHOS", "BILITOT" in the last 168 hours. Lipids No results for input(s): "CHOL", "TRIG", "HDL", "LABVLDL", "LDLCALC", "CHOLHDL" in the last 168 hours.  Hematology Recent Labs  Lab 06/11/23 1456 06/12/23 0354  WBC 5.1 4.6  RBC 2.88* 2.58*  2.50*  HGB 8.4* 7.4*  HCT 25.7* 22.5*  MCV 89.2 87.2  MCH 29.2 28.7  MCHC 32.7 32.9  RDW 13.5 13.4  PLT 248 243   Thyroid No results for input(s): "TSH", "FREET4" in the last 168 hours.  BNPNo results for input(s): "BNP", "PROBNP" in the last 168 hours.  DDimer No results for input(s): "DDIMER" in the last 168 hours.   Radiology/Studies:  DG Chest 2 View  Result Date: 06/11/2023 CLINICAL DATA:  Shortness  of breath EXAM: CHEST - 2 VIEW COMPARISON:  X-ray 12/27/2022 FINDINGS: Elevation of the left hemidiaphragm. Air-fluid level along the stomach. No consolidation, pneumothorax or effusion. Normal cardiopericardial silhouette without edema. Degenerative changes along the spine. IMPRESSION: Elevated left hemidiaphragm with an air-fluid level of the stomach. No acute cardiopulmonary disease. Electronically Signed   By: Karen Kays M.D.   On: 06/11/2023 13:37     Assessment and Plan:   Palpitations - have only seen mild ST so far.  Jenny Martin need to be monitored though out stay. May require outpt monitor.   LBBB new - reviewed old EKGs and did not see.   Difficult to control HTN due to  medication intolerances, is followed in Hypertension clinic.Her clonidine was stopped and amlodipine was stopped -chlorthalidone was added and she was given lasix for twice a week.  She has hydralazine prn  Take one tablet as needed for systolic blood pressure more than 180. If BP still more than 180 after 30 minutes, take a second tablet.)  Dyspnea increased after changing meds.  With HFpEF.  Add compressions stockings would try lasix IV for a day, though consider blood transfusion Anemia per IM  Hgb 7.4 today  Constipation. Per IM         Previous antihypertensives Beta-blockers (bisoprolol, carvedilol)-wheezing ACE-swelling, hyponatremia Lasix-hyponatremia (otherwise tolerated) Hydrochlorothiazide-hyponatremia ARB (Telmisartan-rash), Losartan, Valsartan, Irbesartan Clonidine-neck rash, dry mouth Spironolactone - swelling Amlodipine Clonidine   Risk Assessment/Risk Scores:     For questions or updates, please contact [facility] Please consult [email or web address] for contact info under    Signed, Nada Boozer, NP  06/12/2023 12:04 PM  I have seen and examined this patient with Nada Boozer.  Agree with above, note added to reflect my findings.  Patient with a history as above.  She has been trying to adjust her medications.  Medication adjustments as above.  She has been having increased dyspnea with increased heart rates as well.  She had been having palpitations.  At 1 point she felt that she would pass out.  She has been hearing her heartbeat in her ears.  She thus presented to the hospital.  GEN: Well nourished, well developed, in no acute distress  HEENT: normal  Neck: no JVD, carotid bruits, or masses Cardiac: RRR; no murmurs, rubs, or gallops,no edema  Respiratory:  clear to auscultation bilaterally, normal work of breathing GI: soft, nontender, nondistended, + BS MS: no deformity or atrophy  Skin: warm and dry Neuro:  Strength and sensation are intact Psych:  euthymic mood, full affect   Palpitations: Has been in sinus rhythm and sinus tachycardia thus far.  No intervention at this time.  Continue telemetry. Hypertension: Has been difficult to control due to medication intolerances.  Continue as needed hydralazine and chlorthalidone. Dyspnea: Has compression stockings.  Would continue IV Lasix for now.  Patient also anemic. Anemia: Per internal medicine  Anshul Meddings M. Massey Ruhland MD 06/12/2023 2:40 PM

## 2023-06-13 ENCOUNTER — Other Ambulatory Visit: Payer: Self-pay | Admitting: Cardiology

## 2023-06-13 ENCOUNTER — Observation Stay (INDEPENDENT_AMBULATORY_CARE_PROVIDER_SITE_OTHER): Payer: Medicare Other

## 2023-06-13 DIAGNOSIS — R0602 Shortness of breath: Secondary | ICD-10-CM

## 2023-06-13 DIAGNOSIS — I10 Essential (primary) hypertension: Secondary | ICD-10-CM

## 2023-06-13 DIAGNOSIS — Z789 Other specified health status: Secondary | ICD-10-CM

## 2023-06-13 DIAGNOSIS — R002 Palpitations: Secondary | ICD-10-CM | POA: Diagnosis not present

## 2023-06-13 DIAGNOSIS — R079 Chest pain, unspecified: Secondary | ICD-10-CM

## 2023-06-13 DIAGNOSIS — N1832 Chronic kidney disease, stage 3b: Secondary | ICD-10-CM | POA: Diagnosis not present

## 2023-06-13 DIAGNOSIS — I5032 Chronic diastolic (congestive) heart failure: Secondary | ICD-10-CM | POA: Diagnosis not present

## 2023-06-13 HISTORY — DX: Chest pain, unspecified: R07.9

## 2023-06-13 HISTORY — DX: Other specified health status: Z78.9

## 2023-06-13 HISTORY — DX: Shortness of breath: R06.02

## 2023-06-13 LAB — OCCULT BLOOD X 1 CARD TO LAB, STOOL: Fecal Occult Bld: POSITIVE — AB

## 2023-06-13 MED ORDER — SODIUM CHLORIDE 0.9 % IV SOLN
300.0000 mg | Freq: Once | INTRAVENOUS | Status: AC
  Administered 2023-06-13: 300 mg via INTRAVENOUS
  Filled 2023-06-13: qty 15

## 2023-06-13 NOTE — Discharge Summary (Signed)
Physician Discharge Summary  BROOKELYN BOBINSKI GNF:[id number] DOB: [birth year] DOA: 06/11/2023  PCP: Clayborne Dana, NP  Admit date: 06/11/2023 Discharge date: 06/13/2023  Admitted From: Home  Discharge disposition: Home  Recommendations for Outpatient Follow-Up:   Follow up with your primary care provider in one week.  Check CBC, BMP, magnesium in the next visit Patient will be arranged for outpatient cardiac monitor on discharge.  to follow instructions Patient will benefit from iron supplementation as outpatient.  FOBT positive but no gross blood.  Had colonoscopy January 2022 would recommend outpatient follow-up.   Discharge Diagnosis:   Principal Problem:   Palpitations Active Problems:   Normocytic anemia   Hypertension   Mild intermittent asthma   CKD stage 3b, GFR 30-44 ml/min (HCC)   Chronic heart failure with preserved ejection fraction (HFpEF) (HCC)   Shortness of breath   Chest pain   Medication intolerance   Discharge Condition: Improved.  Diet recommendation:Regular.  Wound care: None.  Code status: Full.   History of Present Illness:   Jenny Martin is a 77 y.o. female with medical history significant for hypertension, CKD 3b, chronic HFpEF, anemia, and asthma presented to hospital with palpitations for 3 to 4 days with difficulty breathing.  She was recently taken off clonidine and Norvasc was started on chlorthalidone and hydralazine earlier this month.  In the ED, patient had tachycardia with a rate of 101 with LBBB.  Chest x-ray was negative for acute findings.  Labs showed hemoglobin of 9.4 with troponin of 38 and 45 with creatinine of 1.2.  Cardiology was consulted from the ED and was admitted hospital for further evaluation and treatment.  Hospital Course:   Following conditions were addressed during hospitalization as listed below,  Palpitation/new LBBB. Presenting with elevated troponins without any chest pain.  Cardiology saw the patient  during hospitalization with plans for outpatient monitoring for tachycardia.   Patient does have multiple intolerances to medication.  Chest x-ray with elevated left hemidiaphragm but no infiltrate.  Patient did have a recent 2D echocardiogram on 12/22/2022 with LV ejection fraction of 65% with grade 1 diastolic dysfunction.  At this time plan for outpatient monitoring on discharge.    Chronic HFpEF  History appears compensated, Continue chlorthalidone, unable to take  multiple medications due to intolerances.  Mild hypokalemia.  Improved after replacement.  Latest potassium of 4.3.  Hypertension  On chlorthalidone which was continued.  This will be continued on discharge.  CKD 3b  - SCr 1.28 on admission, likely at baseline.  Creatinine prior to discharge was 1.5.  Will need to follow-up as outpatient.    Asthma  Continue albuterol.  Not in exacerbation.    Anemia   Hgb 8.4 on admission, down from 10.1 in February.  Repeat hemoglobin of 7.4.  No mention of external blood loss but FOBT was positive..  Ferritin level at 18, iron of 36, folic acid 20, vitamin B12 at 603.  Patient received 1 dose of IV Venofer prior to discharge.  Would benefit from ongoing iron supplementation as outpatient and possible GI workup for.  Of note patient states that she strains a lot during bowel movement and likely had hemorrhoids in the past.  She did have a outpatient colonoscopy in the January 2022 with a small diverticula with no bleeding.Grade 1  hemorrhoids.  Disposition.  At this time, patient is stable for disposition home with outpatient PCP follow-up.  Medical Consultants:   Cardiology  Procedures:    None  Subjective:   Today, patient was seen and examined at bedside.  Complains of constipation and straining during bowel movement.  History of hemorrhoids in the past.  No gross red blood .  Patient with colonoscopy in [facility] in January 2022.  Discharge Exam:   Vitals:    06/13/23 1200 06/13/23 1357  BP: (!) 152/67 (!) 144/59  Pulse:  77  Resp:  18  Temp:  98.1 F (36.7 C)  SpO2:     Vitals:   06/13/23 0458 06/13/23 0755 06/13/23 1200 06/13/23 1357  BP:  (!) 153/52 (!) 152/67 (!) 144/59  Pulse:    77  Resp:  18  18  Temp:  99.3 F (37.4 C)  98.1 F (36.7 C)  TempSrc:  Oral  Oral  SpO2:      Weight: 123.4 kg     Height:       Body mass index is 45.27 kg/m.  General: Alert awake, not in obvious distress, obese built HENT: pupils equally reacting to light, pallor noted  chest:  Clear breath sounds.  Diminished breath sounds bilaterally. No crackles or wheezes.  CVS: S1 &S2 heard. No murmur.  Regular rate and rhythm. Abdomen: Soft, nontender, nondistended.  Bowel sounds are heard.   Extremities: No cyanosis, clubbing or edema.  P Eripheral pulses are palpable. Psych: Alert, awake and oriented, normal mood CNS:  No cranial nerve deficits.  Power equal in all extremities.   Skin: Warm and dry.  No rashes noted.  The results of significant diagnostics from this hospitalization (including imaging, microbiology, ancillary and laboratory) are listed below for reference.     Diagnostic Studies:   DG Chest 2 View  Result Date: 06/11/2023 CLINICAL DATA:  Shortness of breath EXAM: CHEST - 2 VIEW COMPARISON:  X-ray 12/27/2022 FINDINGS: Elevation of the left hemidiaphragm. Air-fluid level along the stomach. No consolidation, pneumothorax or effusion. Normal cardiopericardial silhouette without edema. Degenerative changes along the spine. IMPRESSION: Elevated left hemidiaphragm with an air-fluid level of the stomach. No acute cardiopulmonary disease. Electronically Signed   By: Karen Kays M.D.   On: 06/11/2023 13:37     Labs:   Basic Metabolic Panel: Recent Labs  Lab 06/09/23 1420 06/11/23 1456 06/12/23 0354 06/13/23 0102  NA 135 133* 132* 131*  K 4.0 4.3 3.4* 4.3  CL 94* 94* 95* 96*  CO2 29 27 26 25   GLUCOSE 96 94 96 98  BUN 27 31* 27* 29*   CREATININE 1.39* 1.28* 1.42* 1.52*  CALCIUM 9.3 9.2 8.4* 8.7*  MG  --   --  2.2 2.3   GFR Estimated Creatinine Clearance: 42.2 mL/min (A) (by C-G formula based on SCr of 1.52 mg/dL (H)). Liver Function Tests: No results for input(s): "AST", "ALT", "ALKPHOS", "BILITOT", "PROT", "ALBUMIN" in the last 168 hours. No results for input(s): "LIPASE", "AMYLASE" in the last 168 hours. No results for input(s): "AMMONIA" in the last 168 hours. Coagulation profile No results for input(s): "INR", "PROTIME" in the last 168 hours.  CBC: Recent Labs  Lab 06/11/23 1456 06/12/23 0354 06/13/23 0102  WBC 5.1 4.6 4.7  HGB 8.4* 7.4* 8.5*  HCT 25.7* 22.5* 26.4*  MCV 89.2 87.2 90.4  PLT 248 243 263   Cardiac Enzymes: No results for input(s): "CKTOTAL", "CKMB", "CKMBINDEX", "TROPONINI" in the last 168 hours. BNP: Invalid input(s): "POCBNP" CBG: No results for input(s): "GLUCAP" in the last 168 hours. D-Dimer No results for input(s): "DDIMER" in the last 72 hours. Hgb A1c No  results for input(s): "HGBA1C" in the last 72 hours. Lipid Profile No results for input(s): "CHOL", "HDL", "LDLCALC", "TRIG", "CHOLHDL", "LDLDIRECT" in the last 72 hours. Thyroid function studies No results for input(s): "TSH", "T4TOTAL", "T3FREE", "THYROIDAB" in the last 72 hours.  Invalid input(s): "FREET3" Anemia work up Recent Labs    06/12/23 0354  VITAMINB12 603  FOLATE 20.7  FERRITIN 18  TIBC 286  IRON 36  RETICCTPCT 1.3   Microbiology No results found for this or any previous visit (from the past 240 hour(s)).   Discharge Instructions:   Discharge Instructions     Diet general   Complete by: As directed    Discharge instructions   Complete by: As directed    Follow-up with your primary care provider in 1 week.  Check blood work at that time.  You will be prescribed the heart monitor after discharge.  Follow-up as per instructions.  Seek medical attention for worsening symptoms.   Increase activity  slowly   Complete by: As directed       Allergies as of 06/13/2023       Reactions   Clonidine Derivatives Shortness Of Breath   Hydralazine Shortness Of Breath   Atorvastatin Other (See Comments)   Myalgias   Hydrochlorothiazide Other (See Comments)   It drys me out.   Losartan Other (See Comments)   Headaches   Micardis Hct [telmisartan-hctz] Swelling, Other (See Comments)   Ankles and legs swell   Pravastatin Other (See Comments)   Myalgias   Statins Other (See Comments)   Cramping and muscle pain   Telmisartan Hives   Valsartan-hydrochlorothiazide Other (See Comments)   Made the chest burn   Ibuprofen Hives, Rash        Medication List     STOP taking these medications    hydrALAZINE 25 MG tablet Commonly known as: APRESOLINE       TAKE these medications    albuterol 108 (90 Base) MCG/ACT inhaler Commonly known as: VENTOLIN HFA Inhale 2 puffs into the lungs every 6 (six) hours as needed for shortness of breath or wheezing.   aspirin 325 MG tablet Take 325 mg by mouth every 6 (six) hours as needed for mild pain or moderate pain.   Centrum Minis Adults 50+ Tabs Take 1 tablet by mouth daily in the afternoon.   chlorthalidone 25 MG tablet Commonly known as: HYGROTON Take 1 tablet (25 mg total) by mouth daily.   ezetimibe 10 MG tablet Commonly known as: ZETIA Take 1 tablet (10 mg total) by mouth daily.   FAMOTIDINE PO Take 1 tablet by mouth daily as needed (Indigestion).   ferrous sulfate 325 (65 FE) MG tablet Take 1 tablet (325 mg total) by mouth every other day.   furosemide 40 MG tablet Commonly known as: Lasix Take 0.5 tablets (20 mg total) by mouth 2 (two) times daily as needed for edema or fluid.   Magnesium 500 MG Tabs Take 1 tablet by mouth every other day.   triamcinolone cream 0.1 % Commonly known as: KENALOG Apply 1 Application topically 2 (two) times daily as needed (for atopic dermatitis- affected areas).          Time  coordinating discharge: 39 minutes  Signed:  Chaz Mcglasson  [facility] 06/13/2023, 4:11 PM

## 2023-06-13 NOTE — Progress Notes (Unsigned)
Enrolled for [employer] to mail a ZIO XT long term holter monitor to the patients address on file.  

## 2023-06-13 NOTE — Progress Notes (Signed)
    Per Bettina Gavia, PA-C and Dr. Duke Salvia, heart monitor for palpitations and tachycardia.   Perlie Gold, PA-C

## 2023-06-13 NOTE — Progress Notes (Signed)
Patient Name: Jenny Martin Date of Encounter: 06/13/2023 [facility] Cardiologist: Chilton Si, MD   Interval Summary  .    Feeling much better.  Able to rest.  Denies CP or palpitations.  Concerned about trying any other BP meds.   Vital Signs .    Vitals:   06/12/23 1900 06/13/23 0254 06/13/23 0458 06/13/23 0755  BP: (!) 147/75 134/69  (!) 153/52  Pulse: 81 66    Resp: 18 18  18   Temp: 98.2 F (36.8 C) (!) 97.5 F (36.4 C)  99.3 F (37.4 C)  TempSrc: Oral Oral  Oral  SpO2: 98% 97%    Weight:   123.4 kg   Height:        Intake/Output Summary (Last 24 hours) at 06/13/2023 0838 Last data filed at 06/13/2023 0459 Gross per 24 hour  Intake 940 ml  Output 750 ml  Net 190 ml      06/13/2023    4:58 AM 06/12/2023    5:00 AM 06/11/2023   11:46 PM  Last 3 Weights  Weight (lbs) 272 lb 0.8 oz 271 lb 6.2 oz 271 lb 6.4 oz  Weight (kg) 123.4 kg 123.1 kg 123.106 kg      Telemetry/ECG    Sinus rhythm.  No events - Personally Reviewed  Echo 12/22/22:  1. GLS -19.4. Left ventricular ejection fraction, by estimation, is 60 to  65%. The left ventricle has normal function. The left ventricle has no  regional wall motion abnormalities. There is mild left ventricular  hypertrophy. Left ventricular diastolic  parameters are consistent with Grade I diastolic dysfunction (impaired  relaxation).   2. Right ventricular systolic function is normal. The right ventricular  size is normal.   3. The mitral valve is normal in structure. Mild mitral valve  regurgitation. No evidence of mitral stenosis.   4. The aortic valve is normal in structure. Aortic valve regurgitation is  not visualized. No aortic stenosis is present.   5. The inferior vena cava is normal in size with greater than 50%  respiratory variability, suggesting right atrial pressure of 3 mmHg.   Physical Exam .    VS:  BP (!) 153/52 (BP Location: Right Arm)   Pulse 66   Temp 99.3 F (37.4 C) (Oral)    Resp 18   Ht 5\' 5"  (1.651 m)   Wt 123.4 kg   SpO2 97%   BMI 45.27 kg/m  , BMI Body mass index is 45.27 kg/m. GENERAL:  Well appearing HEENT: Pupils equal round and reactive, fundi not visualized, oral mucosa unremarkable NECK:  No jugular venous distention, waveform within normal limits, carotid upstroke brisk and symmetric, no bruits, no thyromegaly LUNGS:  Clear to auscultation bilaterally HEART:  RRR.  PMI not displaced or sustained,S1 and S2 within normal limits, no S3, no S4, no clicks, no rubs, II/VI systolic murmur at LUSB ABD:  Flat, positive bowel sounds normal in frequency in pitch, no bruits, no rebound, no guarding, no midline pulsatile mass, no hepatomegaly, no splenomegaly EXT:  2 plus pulses throughout, no edema, no cyanosis no clubbing SKIN:  No rashes no nodules NEURO:  Cranial nerves II through XII grossly intact, motor grossly intact throughout PSYCH:  Cognitively intact, oriented to person place and time   Assessment & Plan .     46F with HFpEF, CKD 3b, HTN, HL GERD, ashtma and multiple medication intolerances admitted with palpitaitons.  Cardiology consulted for HTN, dyspnea and palpitations.   #  HTN:  Increased dyspnea since stopping amlodipine and clonidine and starting chlorthalidone.  BP remains uncontrolled.  She is not interested in trying any other medications at this time.  Continue chlorthalidone.  She does have hyponatremia, which has been an issue in the past.  Continue to monitor.  Other options include doxazosin.  She would also be a good candidate for the [place] study with Zilebesiran.  Previous antihypertensives: Beta-Blockers, Carvedilol - Wheezing ACE inhibitors - Swelling, Hyponatremia Lasix - Hyponatremia HCTZ - Hyponatremia Telmisartan - Rash Clonidine Amlodipine Hydralazine  # LBBB:  # + Troponin: LBBB is new.  Troponin very mildly elevated.  Given her renal dysfunction, not eager to pursue cardiac catheterization, especially given lack  of symptoms.  Consider outpatient stress testing.  # Palpitaitons:  No inpatient arrhytmia.  +Sinus tachycardia.  Consider outpatient monitor if symptoms recur.  # Dyspnea:  Likely mutlifactorial.  Hgb 7.4.  This is down from 10.1 5 months ago.  +elevated hemidiaphragm.  Echo unremarkable other than grade 1 diastolic dysfunction. And mild LVH.   For questions or updates, please contact [facility] Please consult [email or web address] for contact info under        Signed, Chilton Si, MD

## 2023-06-14 ENCOUNTER — Telehealth: Payer: Self-pay

## 2023-06-14 NOTE — Telephone Encounter (Signed)
Pt thought she missed call from her provider to see how she is feeling. Pt said that she is staying with her son in [place] right now but she is doing okay. Pt advised that she be reached on mobile number if she needs to be reached.

## 2023-06-14 NOTE — Transitions of Care (Post Inpatient/ED Visit) (Signed)
   06/14/2023  Name: Jenny Martin MRN: [id number] DOB: 1946/05/16  Today's TOC FU Call Status: Today's TOC FU Call Status:: Unsuccessul Call (1st Attempt) Unsuccessful Call (1st Attempt) Date: 06/14/23  Attempted to reach the patient regarding the most recent Inpatient/ED visit.  Follow Up Plan: Additional outreach attempts will be made to reach the patient to complete the Transitions of Care (Post Inpatient/ED visit) call.   Signature Karena Addison, LPN [facility] Nurse Health Advisor Direct Dial [phone]

## 2023-06-14 NOTE — Telephone Encounter (Signed)
Patient did reach back out, see note. Thanks!

## 2023-06-15 ENCOUNTER — Encounter (INDEPENDENT_AMBULATORY_CARE_PROVIDER_SITE_OTHER): Payer: Self-pay

## 2023-06-16 DIAGNOSIS — R002 Palpitations: Secondary | ICD-10-CM | POA: Diagnosis not present

## 2023-06-23 ENCOUNTER — Encounter (HOSPITAL_BASED_OUTPATIENT_CLINIC_OR_DEPARTMENT_OTHER): Payer: Medicare Other | Admitting: Cardiovascular Disease

## 2023-07-07 ENCOUNTER — Telehealth: Payer: Self-pay

## 2023-07-07 MED ORDER — METOPROLOL SUCCINATE ER 25 MG PO TB24: 25.0000 mg | ORAL_TABLET | Freq: Every day | ORAL | 3 refills | Status: DC

## 2023-07-07 NOTE — Telephone Encounter (Signed)
-----   Message from Aundra Dubin Revankar sent at 07/06/2023  5:13 PM EDT ----- sHe has some extra beats and fast heart rates.  Can start Toprol-XL 25 mg daily.  Copy primary care Garwin Brothers, MD 07/06/2023 5:13 PM

## 2023-07-08 ENCOUNTER — Encounter: Payer: Medicare Other | Admitting: *Deleted

## 2023-07-08 NOTE — Progress Notes (Signed)
Pt scheduled for [facility] too soon. This encounter was created in error - please disregard.

## 2023-07-20 ENCOUNTER — Encounter: Payer: Self-pay | Admitting: Cardiology

## 2023-07-21 NOTE — Telephone Encounter (Signed)
Please advise on recall pt is referring to.

## 2023-07-26 ENCOUNTER — Ambulatory Visit (HOSPITAL_BASED_OUTPATIENT_CLINIC_OR_DEPARTMENT_OTHER): Payer: Medicare Other | Admitting: Cardiovascular Disease

## 2023-07-26 ENCOUNTER — Encounter (HOSPITAL_BASED_OUTPATIENT_CLINIC_OR_DEPARTMENT_OTHER): Payer: Self-pay | Admitting: Cardiovascular Disease

## 2023-07-26 VITALS — BP 122/67 | HR 51 | Temp 98.6°F | Resp 20 | Ht 65.0 in | Wt 268.0 lb

## 2023-07-26 DIAGNOSIS — Z006 Encounter for examination for normal comparison and control in clinical research program: Secondary | ICD-10-CM

## 2023-07-26 DIAGNOSIS — N1832 Chronic kidney disease, stage 3b: Secondary | ICD-10-CM | POA: Diagnosis not present

## 2023-07-26 DIAGNOSIS — E782 Mixed hyperlipidemia: Secondary | ICD-10-CM | POA: Diagnosis not present

## 2023-07-26 DIAGNOSIS — I10 Essential (primary) hypertension: Secondary | ICD-10-CM | POA: Diagnosis not present

## 2023-07-26 DIAGNOSIS — I5032 Chronic diastolic (congestive) heart failure: Secondary | ICD-10-CM | POA: Diagnosis not present

## 2023-07-26 MED ORDER — METOPROLOL TARTRATE 25 MG PO TABS: 25.0000 mg | ORAL_TABLET | Freq: Two times a day (BID) | ORAL | 3 refills | Status: DC

## 2023-07-26 NOTE — Addendum Note (Signed)
Addended by: Chilton Si C on: 07/26/2023 07:49 PM   Modules accepted: Orders

## 2023-07-26 NOTE — Progress Notes (Signed)
[facility] Follow-up:    Date:  07/26/2023   ID:  Jenny Martin, DOB [birth year], MRN [id number]  PCP:  Clayborne Dana, NP  Cardiologist:  Chilton Si, MD  Nephrologist:  Referring MD: Clayborne Dana, NP   CC: Hypertension  History of Present Illness:    Jenny Martin is a 77 y.o. female with a hx of hypertension, hyperlipidemia, anemia, GERD, asthma, pneumonia, carpal tunnel syndrome, and obesity, here for follow-up.  She first established care in the [facility] 01/2023. On 12/02/2022 she presented to the ED with complaints of chest pain and shortness of breath. She noted that she had taken her first dose of carvedilol the prior evening. She woke up with chest tightness and was hypertensive between 180 and 214 systolic. Her blood pressure on arrival was in the 190s but improved to the 140s on its own when brought to the exam room. Repeat troponin was normal. Urinalysis noted microscopic hematuria without infection. Her BP trended up to the 170s; she was given hydralazine 10 mg IV and Lasix 10 mg IV for peripheral edema. She returned to the ED the following day complaining of headaches. She wished to discontinue carvedilol although she was advised that headaches would likely resolve after 1-2 weeks on the medication. She was referred to cardiology for outpatient workup and treatment of her high blood pressure.   She was seen by Dr. Tomie China 12/07/2022 and her BP was 180/73. Her EKG showed NSR and nonspecific ST-T changes. She was only taking 20 mg furosemide daily. It was noted that she had known intolerance to multiple antihypertensives. She had wheezing with beta-blocker such as carvedilol. With ACE inhibitors she developed swelling and hyponatremia. Clonidine 0.1 mg BID was started. Renal artery dopplers 11/2022 showed no evidence of renal artery stenosis; a cyst was noted in the right upper pole. A murmur was heard on exam; she had an echocardiogram  12/2022 revealing LVEF 60-65%, mild LVH, and grade 1 diastolic dysfunction. There was mild mitral valve regurgitation and no evidence of valvular stenosis.  On 12/16/22 she presented to the ED with complaints of worsening LE edema, SOB, chest tightness, and tingling sensation of her RUE. She also complained of dry mouth and neck rash from clonidine. She had stopped her Lasix about 1 week prior as it was ineffective. CBC and BMP with stable counts, BNP 101.6, and troponin x2 negative. EKG without ischemia, infarction, or arrhythmia. She was instructed to take her clonidine when arriving home and restart her Lasix. The next day she returned to the ED with similar complaints and was found to have hyponatremia to 131. It was felt her symptoms may have had a psychosomatic component. Her clonidine was discontinued and switched to irbesartan. She reported a prior adverse reaction of rash on telmisartan. She again was seen in the ED the following day with hypertensive concerns and was given a trial of bisoprolol 5 mg. On 12/20/22 in the ED she was hypertensive and requested a new medication, but they recommended follow up with her primary providers given her multiple intolerances. She saw her PCP 12/23/22 and her BP was 148/63. They started amlodipine 2.5 mg. She went back to the ED 12/27/22 with cough and Covid-19. She was hypertensive but no changes were made at that time. She saw her PCP 2/23 and blood pressure remained elevated, but they recommended waiting for cardiology follow-up. She followed up with Dr. Tomie China 01/11/2023 and her BP was 154/68. She had stopped taking  her midcations. She was instructed to take amlodipine 2.5 mg BID.  At her initial visit she was started on spironolactone.  She followed up with Gillian Shields, NP. She had been prescribed doxazosin but was afraid to take it due to concern about potential dizziness.  Amlodipine was increased.  She has struggled with multiple medication intolerances and  frequently stops her medications soon after initiating a new medication.  At her visit 04/2023 blood pressure was over 200 systolic.  She did agree to try low-dose doxazosin.  She was hospitalized 05/2023 with palpitations and was found to have sinus tachycardia.  Blood pressures were remained uncontrolled.  She started on chlorthalidone and both amlodipine and clonidine were discontinued due to potential side effects but she remained uncontrolled and was not interested in trying any other medications.  She wore an ambulatory monitor that revealed 52 runs of SVT and was recommended to start metoprolol.  Today, she reports that she had tried half a tablet of metoprolol, but then stopped it after discovering a prior lawsuit online and potentially significant side effects. We discussed this at length, with reassurance. In the office her blood pressure is elevated to 198/85 initially, and 182/68 on manual recheck. Recently had breakfast this morning. She notes that she had tolerated triamterene-HCTZ in the past and wonders if she should retrial this. Currently the only diuretic she takes is chlorthalidone. She confirms that she has been feeling very stressed and anxious. Lately she has been aware of rapid palpitations, and she is able to hear her heart beating. Additionally she complains of some indigestion. She denies any chest pain, shortness of breath, headaches, syncope, orthopnea, or PND.  Previous antihypertensives: Beta-Blockers, Carvedilol - Wheezing ACE inhibitors - Swelling, Hyponatremia Lasix - Hyponatremia HCTZ - Hyponatremia Telmisartan - Rash Clonidine  Past Medical History:  Diagnosis Date   Abnormal TSH 12/16/2015   Allergic rhinitis 12/10/2015   Anemia    Arachnoid cyst    Arcus senilis of both eyes 12/10/2015   Atopic dermatitis    Blood transfusion without reported diagnosis    Cardiac murmur 12/07/2022   Carpal tunnel syndrome    Chronic vulvitis    Cortical age-related cataract  of both eyes 04/27/2018   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   Edema 12/10/2015   Gastroesophageal reflux disease without esophagitis 06/08/2022   H/O bladder infections    History of chicken pox    HTN (hypertension), benign 01/03/2022   Hypercholesterolemia    Hyperlipidemia 12/10/2015   Hyperopia with astigmatism and presbyopia, bilateral 12/10/2015   Hypertension    Hypo-osmolality and hyponatremia 01/08/2022   Hypomagnesemia 01/03/2022   Hyponatremia 01/02/2022   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 04/27/2018   Mild intermittent asthma 12/16/2015   Morbid obesity (HCC) 12/07/2022   Need for prophylactic vaccination with Streptococcus pneumoniae (Pneumococcus) and Influenza vaccines 12/16/2015   Normocytic anemia 01/03/2022   Nuclear sclerotic cataract of both eyes 04/25/2017   Obese    Pneumonia, pneumococcal (HCC)    S/P thoracotomy    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   aspirin 325 MG tablet Take 325 mg by mouth every 6 (six) hours as needed for mild pain or moderate pain.   chlorthalidone (HYGROTON) 25 MG tablet Take 1 tablet (25 mg total) by mouth daily.   ezetimibe (ZETIA) 10 MG tablet Take 1 tablet (10 mg total) by mouth daily.   FAMOTIDINE PO Take 1 tablet by mouth daily as needed (Indigestion).   ferrous sulfate 325 (65 FE) MG tablet Take 1 tablet (325 mg total) by mouth every other day.   furosemide (LASIX) 40 MG tablet Take 0.5 tablets (20 mg total) by mouth 2 (two) times daily as needed for edema or fluid.   Magnesium 500 MG  TABS Take 1 tablet by mouth every other day.   metoprolol tartrate (LOPRESSOR) 25 MG tablet Take 1 tablet (25 mg total) by mouth 2 (two) times daily.   Multiple Vitamins-Minerals (CENTRUM MINIS ADULTS 50+) TABS Take 1 tablet by mouth daily in the afternoon.   triamcinolone cream (KENALOG) 0.1 % Apply 1 Application topically 2 (two) times daily as needed (for atopic dermatitis- affected areas).     Allergies:   Clonidine derivatives, Hydralazine, Atorvastatin, Hydrochlorothiazide, Losartan, Micardis hct [telmisartan-hctz], Pravastatin, Statins, Telmisartan, Valsartan-hydrochlorothiazide, and Ibuprofen   Social History   Socioeconomic History   Marital status: Divorced    Spouse name: Not on file   Number of children: Not on file   Years of education: Not on file   Highest education level: Not on file  Occupational History   Not on file  Tobacco Use   Smoking status: Never   Smokeless tobacco: Never  Vaping Use   Vaping status: Never Used  Substance and Sexual Activity   Alcohol use: No   Drug use: No   Sexual activity: Never  Other Topics Concern   Not on file  Social History Narrative   Not on file   Social Determinants of Health   Financial Resource Strain: Not on file  Food Insecurity: No Food Insecurity (06/11/2023)   Hunger Vital Sign    Worried About Running Out of Food in the Last Year: Never true    Ran Out of Food in the Last Year: Never true  Transportation Needs: No Transportation Needs (06/11/2023)   PRAPARE - Administrator, Civil Service (Medical): No    Lack of Transportation (Non-Medical): No  Physical Activity: Insufficiently Active (02/02/2023)   Exercise Vital Sign    Days of Exercise per Week: 5 days    Minutes of Exercise per Session: 20 min  Stress: Not on file  Social Connections: Not on file     Family History: The patient's family history includes Asthma in her mother; COPD in her mother; Cancer in her maternal grandmother;  Hypertension in her father; Stroke in her father.  ROS:   Please see the history of present illness.    (+) Stress/Anxiety (+) Rapid palpitations (+) Indigestion All other systems reviewed and are negative.  EKGs/Labs/Other Studies Reviewed:    Monitor 06/2023: Patch Wear Time:  13 days and 15 hours (2024-08-01T15:38:27-0400 to 2024-08-15T07:36:38-0400)   Patient had a min HR of 57 bpm, max HR of 171 bpm, and avg HR of 75 bpm. Predominant underlying rhythm was Sinus Rhythm. Intermittent Bundle Branch Block was present.    52 Supraventricular Tachycardia runs occurred, the run with the fastest interval lasting 5 beats with a max rate of 171 bpm, the longest lasting 19.2 secs with an avg rate of 124 bpm.  Supraventricular Tachycardia was detected within +/- 45 seconds of symptomatic patient event(s). Isolated SVEs were rare (<1.0%), SVE Couplets were rare (<1.0%), and no SVE Triplets were present.    Isolated VEs were rare (<1.0%), VE Couplets were rare (<1.0%), and no VE Triplets were present.  Echocardiogram 12/22/2022: Sonographer Comments: Patient is obese. Restricted mobility, study  performed in Fowler's position.   IMPRESSIONS   1. GLS -19.4. Left ventricular ejection fraction, by estimation, is 60 to  65%. The left ventricle has normal function. The left ventricle has no  regional wall motion abnormalities. There is mild left ventricular  hypertrophy. Left ventricular diastolic  parameters are consistent with Grade I diastolic dysfunction (impaired  relaxation).   2. Right ventricular systolic function is normal. The right ventricular  size is normal.   3. The mitral valve is normal in structure. Mild mitral valve  regurgitation. No evidence of mitral stenosis.   4. The aortic valve is normal in structure. Aortic valve regurgitation is  not visualized. No aortic stenosis is present.   5. The inferior vena cava is normal in size with greater than 50%  respiratory variability,  suggesting right atrial pressure of 3 mmHg.   Bilateral Renal Artery Dopplers  12/15/2022: Summary:  Largest Aortic Diameter: 2.3 cm    Renal:    Right: Cyst(s) noted. RRV flow present. Normal size right kidney.         Abnormal right Resistive Index. Normal cortical thickness of         right kidney. No evidence of right renal artery stenosis.  Left:  Normal size of left kidney. Abnormal left Resisitve Index.         Normal cortical thickness of the left kidney. LRV flow         present. No evidence of left renal artery stenosis.  Mesenteric:  Normal Celiac artery and Superior Mesenteric artery findings.    EKG:  EKG is personally reviewed. 07/26/2023:  Not ordered. 02/02/2023:  Not ordered.  Recent Labs: 09/30/2022: ALT 23 06/02/2023: BNP 101.0 06/13/2023: BUN 29; Creatinine, Ser 1.52; Hemoglobin 8.5; Magnesium 2.3; Platelets 263; Potassium 4.3; Sodium 131   Recent Lipid Panel No results found for: "CHOL", "TRIG", "HDL", "CHOLHDL", "VLDL", "LDLCALC", "LDLDIRECT"  Physical Exam:    VS:  BP (!) 182/68 (BP Location: Right Arm, Patient Position: Sitting, Cuff Size: Large)   Pulse 82   Ht 5\' 5"  (1.651 m)   Wt 268 lb (121.6 kg)   SpO2 98%   BMI 44.60 kg/m  , BMI Body mass index is 44.6 kg/m. GENERAL:  Well appearing HEENT: Pupils equal round and reactive, fundi not visualized, oral mucosa unremarkable NECK:  No jugular venous distention, waveform within normal limits, carotid upstroke brisk and symmetric, no bruits, no thyromegaly LUNGS:  Clear to auscultation bilaterally HEART:  RRR.  PMI not displaced or sustained,S1 and S2 within normal limits, no S3, no S4, no clicks, no rubs, III/VI systolic murmur at the LUSB ABD:  Flat, positive bowel sounds normal in frequency in pitch, no bruits, no rebound, no guarding, no midline pulsatile mass, no hepatomegaly, no splenomegaly EXT:  2 plus pulses throughout, lymphedema, no cyanosis, no clubbing SKIN:  No rashes, no nodules NEURO:   Cranial nerves II through XII grossly intact, motor grossly intact throughout PSYCH:  Cognitively intact, oriented to person place and time   ASSESSMENT/PLAN:    # HFpEF:  # Hypertension Uncontrolled, with patient experiencing palpitations and concerns about medication side effects. Discussed the benefits  and side effects of Metoprolol, including its safety profile and efficacy in managing hypertension and palpitations. Patient has a history of inconsistent medication use and concerns about side effects. -Start Metoprolol 25mg  BID, short-acting version. -Advise patient to start with half a tablet BID for a week, then increase to a full tablet BID if tolerated. -Continue Chlorthalidone.  Check BMP.  # Anxiety Patient reports increased stress and anxiety, potentially contributing to hypertension. -Encourage patient to seek help for managing stress and anxiety.  # Anemia # Hyponatremia: Patient reports history of anemia and recent hospitalization for low sodium. -Order complete blood count and basic metabolic panel to assess current status.  # General Health Maintenance -Advise patient to get flu shot at pharmacy. -Encourage increased physical activity and continued weight loss. -Follow-up in one month to reassess blood pressure control and response to Metoprolol.       Screening for Secondary Hypertension:      02/02/2023    9:45 AM  Causes  Drugs/Herbals Screened     - Comments No EtOH.  No NSAIDs.  Tries to limit sodium.  1 coffee daily  Renovascular HTN Screened     - Comments Normal 11/2022  Sleep Apnea N/A     - Comments no snoring  Thyroid Disease Screened     - Comments normal 07/2022  Hyperaldosteronism N/A  Pheochromocytoma N/A  Cushing's Syndrome N/A  Hyperparathyroidism N/A  Coarctation of the Aorta Screened  Compliance Screened     - Comments BP higher R>L    Relevant Labs/Studies:    Latest Ref Rng & Units 06/13/2023    1:02 AM 06/12/2023    3:54 AM  06/11/2023    2:56 PM  Basic Labs  Sodium 135 - 145 mmol/L 131  132  133   Potassium 3.5 - 5.1 mmol/L 4.3  3.4  4.3   Creatinine 0.44 - 1.00 mg/dL 2.95  6.21  3.08                    12/15/2022    9:41 AM  Renovascular   Renal Artery [place] Completed Yes      Disposition:    FU in [facility] in 1-2 months.  Medication Adjustments/Labs and Tests Ordered: Current medicines are reviewed at length with the patient today.  Concerns regarding medicines are outlined above.   Orders Placed This Encounter  Procedures   CBC with Differential/Platelet   Basic metabolic panel   Meds ordered this encounter  Medications   metoprolol tartrate (LOPRESSOR) 25 MG tablet    Sig: Take 1 tablet (25 mg total) by mouth 2 (two) times daily.    Dispense:  180 tablet    Refill:  3    D/C METOPROLOL SUC   I,Mathew Stumpf,acting as a scribe for Chilton Si, MD.,have documented all relevant documentation on the behalf of Chilton Si, MD,as directed by  Chilton Si, MD while in the presence of Chilton Si, MD.  I, Maimuna Leaman C. Duke Salvia, MD have reviewed all documentation for this visit.  The documentation of the exam, diagnosis, procedures, and orders on 07/26/2023 are all accurate and complete.  Signed, Chilton Si, MD  07/26/2023 5:21 PM    [facility]

## 2023-07-26 NOTE — Research (Signed)
I saw pt today after Dr. Leonides Sake follow up visit. Pt is in Dr. Leonides Sake Virtual Care HTN Study. Pt filled out research survey. Pt was enrolled in Group 1. Pt has completed the Virtual Care HTN Study.

## 2023-07-26 NOTE — Progress Notes (Deleted)
[facility] Initial Assessment:    Date:  07/26/2023   ID:  DIEU MINION, DOB 1946/07/21, MRN [id number]  PCP:  Clayborne Dana, NP  Cardiologist:  Chilton Si, MD  Nephrologist:  Referring MD: Clayborne Dana, NP   CC: Hypertension  History of Present Illness:    Jenny Martin is a 77 y.o. female with a hx of hypertension, hyperlipidemia, anemia, GERD, asthma, pneumonia, carpal tunnel syndrome, and obesity, here for follow-up.  She first established care in the [facility] 01/2023. On 12/02/2022 she presented to the ED with complaints of chest pain and shortness of breath. She noted that she had taken her first dose of carvedilol the prior evening. She woke up with chest tightness and was hypertensive between 180 and 214 systolic. Her blood pressure on arrival was in the 190s but improved to the 140s on its own when brought to the exam room. Repeat troponin was normal. Urinalysis noted microscopic hematuria without infection. Her BP trended up to the 170s; she was given hydralazine 10 mg IV and Lasix 10 mg IV for peripheral edema. She returned to the ED the following day complaining of headaches. She wished to discontinue carvedilol although she was advised that headaches would likely resolve after 1-2 weeks on the medication. She was referred to cardiology for outpatient workup and treatment of her high blood pressure.   She was seen by Dr. Tomie China 12/07/2022 and her BP was 180/73. Her EKG showed NSR and nonspecific ST-T changes. She was only taking 20 mg furosemide daily. It was noted that she had known intolerance to multiple antihypertensives. She had wheezing with beta-blocker such as carvedilol. With ACE inhibitors she developed swelling and hyponatremia. Clonidine 0.1 mg BID was started. Renal artery dopplers 11/2022 showed no evidence of renal artery stenosis; a cyst was noted in the right upper pole. A murmur was heard on exam; she had an  echocardiogram 12/2022 revealing LVEF 60-65%, mild LVH, and grade 1 diastolic dysfunction. There was mild mitral valve regurgitation and no evidence of valvular stenosis.  On 12/16/22 she presented to the ED with complaints of worsening LE edema, SOB, chest tightness, and tingling sensation of her RUE. She also complained of dry mouth and neck rash from clonidine. She had stopped her Lasix about 1 week prior as it was ineffective. CBC and BMP with stable counts, BNP 101.6, and troponin x2 negative. EKG without ischemia, infarction, or arrhythmia. She was instructed to take her clonidine when arriving home and restart her Lasix. The next day she returned to the ED with similar complaints and was found to have hyponatremia to 131. It was felt her symptoms may have had a psychosomatic component. Her clonidine was discontinued and switched to irbesartan. She reported a prior adverse reaction of rash on telmisartan. She again was seen in the ED the following day with hypertensive concerns and was given a trial of bisoprolol 5 mg. On 12/20/22 in the ED she was hypertensive and requested a new medication, but they recommended follow up with her primary providers given her multiple intolerances. She saw her PCP 12/23/22 and her BP was 148/63. They started amlodipine 2.5 mg. She went back to the ED 12/27/22 with cough and Covid-19. She was hypertensive but no changes were made at that time. She saw her PCP 2/23 and blood pressure remained elevated, but they recommended waiting for cardiology follow-up. She followed up with Dr. Tomie China 01/11/2023 and her BP was 154/68. She had stopped  taking her midcations. She was instructed to take amlodipine 2.5 mg BID.  At her initial visit she was started on spironolactone.  She followed up with Gillian Shields, NP she had been prescribed doxazosin but was afraid to take it due to concern about potential dizziness.  Amlodipine was increased.  She has struggled with multiple medication  intolerances and frequently stops her medications soon after initiating a new medication.  At her visit 04/2023 blood pressure was over 200 systolic.  She did agree to try low-dose doxazosin.  She was hospitalized 05/2023 with palpitations and was found to have sinus tachycardia.  Blood pressures were remained uncontrolled.  She started on chlorthalidone and both amlodipine and clonidine were discontinued due to potential side effects but she remained uncontrolled and was not interested in trying any other medications.  She went ambulatory monitor that revealed 52 runs of SVT and was recommended to start metoprolol.   Previous antihypertensives: Beta-Blockers, Carvedilol - Wheezing ACE inhibitors - Swelling, Hyponatremia Lasix - Hyponatremia HCTZ - Hyponatremia Telmisartan - Rash Clonidine  Past Medical History:  Diagnosis Date   Abnormal TSH 12/16/2015   Allergic rhinitis 12/10/2015   Anemia    Arachnoid cyst    Arcus senilis of both eyes 12/10/2015   Atopic dermatitis    Blood transfusion without reported diagnosis    Cardiac murmur 12/07/2022   Carpal tunnel syndrome    Chronic vulvitis    Cortical age-related cataract of both eyes 04/27/2018   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   Edema 12/10/2015   Gastroesophageal reflux disease without esophagitis 06/08/2022   H/O bladder infections    History of chicken pox    HTN (hypertension), benign 01/03/2022   Hypercholesterolemia    Hyperlipidemia 12/10/2015   Hyperopia with astigmatism and presbyopia, bilateral 12/10/2015   Hypertension    Hypo-osmolality and hyponatremia 01/08/2022   Hypomagnesemia 01/03/2022   Hyponatremia 01/02/2022   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 04/27/2018   Mild intermittent asthma 12/16/2015   Morbid obesity (HCC) 12/07/2022   Need for prophylactic vaccination with Streptococcus pneumoniae (Pneumococcus) and Influenza vaccines 12/16/2015   Normocytic anemia 01/03/2022    Nuclear sclerotic cataract of both eyes 04/25/2017   Obese    Pneumonia, pneumococcal (HCC)    S/P thoracotomy    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   aspirin 325 MG tablet Take 325 mg by mouth every 6 (six) hours as needed for mild pain or moderate pain.   chlorthalidone (HYGROTON) 25 MG tablet Take 1 tablet (25 mg total) by mouth daily.   ezetimibe (ZETIA) 10 MG tablet Take 1 tablet (10 mg total) by mouth daily.   FAMOTIDINE PO Take 1 tablet by mouth daily as needed (Indigestion).   ferrous sulfate 325 (65 FE) MG tablet Take 1 tablet (325 mg total) by mouth every other day.   furosemide (LASIX) 40 MG tablet Take 0.5 tablets (20 mg total) by mouth 2 (two) times daily as needed for edema or fluid.   Magnesium 500 MG TABS Take 1 tablet by mouth every  other day.   Multiple Vitamins-Minerals (CENTRUM MINIS ADULTS 50+) TABS Take 1 tablet by mouth daily in the afternoon.   triamcinolone cream (KENALOG) 0.1 % Apply 1 Application topically 2 (two) times daily as needed (for atopic dermatitis- affected areas).     Allergies:   Clonidine derivatives, Hydralazine, Atorvastatin, Hydrochlorothiazide, Losartan, Micardis hct [telmisartan-hctz], Pravastatin, Statins, Telmisartan, Valsartan-hydrochlorothiazide, and Ibuprofen   Social History   Socioeconomic History   Marital status: Divorced    Spouse name: Not on file   Number of children: Not on file   Years of education: Not on file   Highest education level: Not on file  Occupational History   Not on file  Tobacco Use   Smoking  status: Never   Smokeless tobacco: Never  Vaping Use   Vaping status: Never Used  Substance and Sexual Activity   Alcohol use: No   Drug use: No   Sexual activity: Never  Other Topics Concern   Not on file  Social History Narrative   Not on file   Social Determinants of Health   Financial Resource Strain: Not on file  Food Insecurity: No Food Insecurity (06/11/2023)   Hunger Vital Sign    Worried About Running Out of Food in the Last Year: Never true    Ran Out of Food in the Last Year: Never true  Transportation Needs: No Transportation Needs (06/11/2023)   PRAPARE - Administrator, Civil Service (Medical): No    Lack of Transportation (Non-Medical): No  Physical Activity: Insufficiently Active (02/02/2023)   Exercise Vital Sign    Days of Exercise per Week: 5 days    Minutes of Exercise per Session: 20 min  Stress: Not on file  Social Connections: Not on file     Family History: The patient's family history includes Asthma in her mother; COPD in her mother; Cancer in her maternal grandmother; Hypertension in her father; Stroke in her father.  ROS:   Please see the history of present illness.    (+) LE swelling (+) Arthralgias All other systems reviewed and are negative.  EKGs/Labs/Other Studies Reviewed:    Echocardiogram 12/22/2022: Sonographer Comments: Patient is obese. Restricted mobility, study  performed in Fowler's position.   IMPRESSIONS   1. GLS -19.4. Left ventricular ejection fraction, by estimation, is 60 to  65%. The left ventricle has normal function. The left ventricle has no  regional wall motion abnormalities. There is mild left ventricular  hypertrophy. Left ventricular diastolic  parameters are consistent with Grade I diastolic dysfunction (impaired  relaxation).   2. Right ventricular systolic function is normal. The right ventricular  size is normal.   3. The mitral valve is normal in structure. Mild mitral valve  regurgitation. No  evidence of mitral stenosis.   4. The aortic valve is normal in structure. Aortic valve regurgitation is  not visualized. No aortic stenosis is present.   5. The inferior vena cava is normal in size with greater than 50%  respiratory variability, suggesting right atrial pressure of 3 mmHg.   Bilateral Renal Artery Dopplers  12/15/2022: Summary:  Largest Aortic Diameter: 2.3 cm    Renal:    Right: Cyst(s) noted. RRV flow present. Normal size right kidney.         Abnormal right Resistive Index. Normal cortical thickness of         right kidney. No evidence of right renal artery stenosis.  Left:  Normal size of left kidney. Abnormal left Resisitve Index.  Normal cortical thickness of the left kidney. LRV flow         present. No evidence of left renal artery stenosis.  Mesenteric:  Normal Celiac artery and Superior Mesenteric artery findings.    EKG:  EKG is personally reviewed. 02/02/2023: EKG was not ordered.  Recent Labs: 09/30/2022: ALT 23 06/02/2023: BNP 101.0 06/13/2023: BUN 29; Creatinine, Ser 1.52; Hemoglobin 8.5; Magnesium 2.3; Platelets 263; Potassium 4.3; Sodium 131   Recent Lipid Panel No results found for: "CHOL", "TRIG", "HDL", "CHOLHDL", "VLDL", "LDLCALC", "LDLDIRECT"  Physical Exam:    VS:  BP (!) 198/85 (BP Location: Right Arm, Patient Position: Sitting, Cuff Size: Large)   Pulse 82   Ht 5\' 5"  (1.651 m)   Wt 268 lb (121.6 kg)   SpO2 98%   BMI 44.60 kg/m  , BMI Body mass index is 44.6 kg/m. GENERAL:  Well appearing HEENT: Pupils equal round and reactive, fundi not visualized, oral mucosa unremarkable NECK:  No jugular venous distention, waveform within normal limits, carotid upstroke brisk and symmetric, no bruits, no thyromegaly LUNGS:  Clear to auscultation bilaterally HEART:  RRR.  PMI not displaced or sustained,S1 and S2 within normal limits, no S3, no S4, no clicks, no rubs, III/VI systolic murmur at the LUSB ABD:  Flat, positive bowel sounds  normal in frequency in pitch, no bruits, no rebound, no guarding, no midline pulsatile mass, no hepatomegaly, no splenomegaly EXT:  2 plus pulses throughout,lymphedema, no cyanosis, no clubbing SKIN:  No rashes, no nodules NEURO:  Cranial nerves II through XII grossly intact, motor grossly intact throughout PSYCH:  Cognitively intact, oriented to person place and time   ASSESSMENT/PLAN:      # HFpEF:  # Hypertension Uncontrolled, with patient experiencing palpitations and concerns about medication side effects. Discussed the benefits and side effects of Metoprolol, including its safety profile and efficacy in managing hypertension and palpitations. Patient has a history of inconsistent medication use and concerns about side effects. -Start Metoprolol 25mg  BID, short-acting version. -Advise patient to start with half a tablet BID for a week, then increase to a full tablet BID if tolerated. -Continue Chlorthalidone.  Check BMP.  # Anxiety Patient reports increased stress and anxiety, potentially contributing to hypertension. -Encourage patient to seek help for managing stress and anxiety.  # Anemia # Hyponatremia: Patient reports history of anemia and recent hospitalization for low sodium. -Order complete blood count and basic metabolic panel to assess current status.  # General Health Maintenance -Advise patient to get flu shot at pharmacy. -Encourage increased physical activity and continued weight loss. -Follow-up in one month to reassess blood pressure control and response to Metoprolol.      Screening for Secondary Hypertension:     02/02/2023    9:45 AM  Causes  Drugs/Herbals Screened     - Comments No EtOH.  No NSAIDs.  Tries to limit sodium.  1 coffee daily  Renovascular HTN Screened     - Comments Normal 11/2022  Sleep Apnea N/A     - Comments no snoring  Thyroid Disease Screened     - Comments normal 07/2022  Hyperaldosteronism N/A  Pheochromocytoma N/A  Cushing's  Syndrome N/A  Hyperparathyroidism N/A  Coarctation of the Aorta Screened  Compliance Screened     - Comments BP higher R>L    Relevant Labs/Studies:    Latest Ref Rng & Units 06/13/2023    1:02 AM 06/12/2023    3:54 AM 06/11/2023    2:56 PM  Basic  Labs  Sodium 135 - 145 mmol/L 131  132  133   Potassium 3.5 - 5.1 mmol/L 4.3  3.4  4.3   Creatinine 0.44 - 1.00 mg/dL 1.61  0.96  0.45                    12/15/2022    9:41 AM  Renovascular   Renal Artery [place] Completed Yes    Disposition:    FU with APP/PharmD in 1 month for the next 3 months.   FU with Leovanni Bjorkman C. Duke Salvia, MD, [facility] in 4 months.  Medication Adjustments/Labs and Tests Ordered: Current medicines are reviewed at length with the patient today.  Concerns regarding medicines are outlined above.   No orders of the defined types were placed in this encounter.  No orders of the defined types were placed in this encounter.   I,Mathew Stumpf,acting as a Neurosurgeon for Chilton Si, MD.,have documented all relevant documentation on the behalf of Chilton Si, MD,as directed by  Chilton Si, MD while in the presence of Chilton Si, MD.  I, Sarahlynn Cisnero C. Duke Salvia, MD have reviewed all documentation for this visit.  The documentation of the exam, diagnosis, procedures, and orders on 07/26/2023 are all accurate and complete.   Signed, Chilton Si, MD  07/26/2023 11:04 AM    [facility]

## 2023-07-26 NOTE — Patient Instructions (Addendum)
Medication Instructions:  STOP METOPROLOL SUC   START METOPROLOL TARTRATE 25 MG 1/2 TABLET TWICE A DAY. IF YOU ARE TOLERATING OK OVER THE WEEKEND INCREASE TO FULL TABLET TWICE A DAY   Labwork: BMET/CBC TODAY   Testing/Procedures: NONE  Follow-Up: 1 TO 2 MONTHS IN ADV HTN WITH CAITLIN W NP OR DR [place]   If you need a refill on your cardiac medications before your next appointment, please call your pharmacy.

## 2023-07-27 LAB — BASIC METABOLIC PANEL
BUN/Creatinine Ratio: 29 — ABNORMAL HIGH (ref 12–28)
BUN: 38 mg/dL — ABNORMAL HIGH (ref 8–27)
CO2: 24 mmol/L (ref 20–29)
Calcium: 9.5 mg/dL (ref 8.7–10.3)
Chloride: 99 mmol/L (ref 96–106)
Creatinine, Ser: 1.31 mg/dL — ABNORMAL HIGH (ref 0.57–1.00)
Glucose: 95 mg/dL (ref 70–99)
Potassium: 4.3 mmol/L (ref 3.5–5.2)
Sodium: 139 mmol/L (ref 134–144)
eGFR: 42 mL/min/{1.73_m2} — ABNORMAL LOW (ref >59)

## 2023-07-27 LAB — CBC WITH DIFFERENTIAL/PLATELET
Basophils Absolute: 0.1 10*3/uL (ref 0.0–0.2)
Basos: 1 %
EOS (ABSOLUTE): 0.3 10*3/uL (ref 0.0–0.4)
Eos: 6 %
Hematocrit: 28.4 % — ABNORMAL LOW (ref 34.0–46.6)
Hemoglobin: 9.2 g/dL — ABNORMAL LOW (ref 11.1–15.9)
Immature Grans (Abs): 0 10*3/uL (ref 0.0–0.1)
Immature Granulocytes: 0 %
Lymphocytes Absolute: 0.8 10*3/uL (ref 0.7–3.1)
Lymphs: 17 %
MCH: 29.2 pg (ref 26.6–33.0)
MCHC: 32.4 g/dL (ref 31.5–35.7)
MCV: 90 fL (ref 79–97)
Monocytes Absolute: 0.5 10*3/uL (ref 0.1–0.9)
Monocytes: 10 %
Neutrophils Absolute: 3 10*3/uL (ref 1.4–7.0)
Neutrophils: 66 %
Platelets: 241 10*3/uL (ref 150–450)
RBC: 3.15 x10E6/uL — ABNORMAL LOW (ref 3.77–5.28)
RDW: 13.2 % (ref 11.7–15.4)
WBC: 4.5 10*3/uL (ref 3.4–10.8)

## 2023-08-17 ENCOUNTER — Ambulatory Visit (INDEPENDENT_AMBULATORY_CARE_PROVIDER_SITE_OTHER): Payer: Medicare Other | Admitting: Cardiovascular Disease

## 2023-08-17 ENCOUNTER — Encounter (HOSPITAL_BASED_OUTPATIENT_CLINIC_OR_DEPARTMENT_OTHER): Payer: Self-pay | Admitting: Cardiovascular Disease

## 2023-08-17 VITALS — BP 178/68 | HR 67 | Ht 65.0 in | Wt 267.4 lb

## 2023-08-17 DIAGNOSIS — I5032 Chronic diastolic (congestive) heart failure: Secondary | ICD-10-CM | POA: Diagnosis not present

## 2023-08-17 DIAGNOSIS — N1832 Chronic kidney disease, stage 3b: Secondary | ICD-10-CM | POA: Diagnosis not present

## 2023-08-17 DIAGNOSIS — E7800 Pure hypercholesterolemia, unspecified: Secondary | ICD-10-CM | POA: Diagnosis not present

## 2023-08-17 DIAGNOSIS — I10 Essential (primary) hypertension: Secondary | ICD-10-CM | POA: Diagnosis not present

## 2023-08-17 DIAGNOSIS — R0602 Shortness of breath: Secondary | ICD-10-CM

## 2023-08-17 DIAGNOSIS — I1A0 Resistant hypertension: Secondary | ICD-10-CM

## 2023-08-17 DIAGNOSIS — Z5181 Encounter for therapeutic drug level monitoring: Secondary | ICD-10-CM

## 2023-08-17 MED ORDER — CHLORTHALIDONE 25 MG PO TABS: ORAL_TABLET | ORAL | 2 refills | Status: DC

## 2023-08-17 NOTE — Progress Notes (Deleted)
[facility] Follow-up:    Date:  08/17/2023   ID:  Jenny Martin, DOB 1945-12-01, MRN [id number]  PCP:  Clayborne Dana, NP  Cardiologist:  Chilton Si, MD  Nephrologist:  Referring MD: Clayborne Dana, NP   CC: Hypertension  History of Present Illness:    Jenny Martin is a 77 y.o. female with a hx of hypertension, hyperlipidemia, anemia, GERD, asthma, pneumonia, carpal tunnel syndrome, and obesity, here for follow-up.  She first established care in the [facility] 01/2023. On 12/02/2022 she presented to the ED with complaints of chest pain and shortness of breath. She noted that she had taken her first dose of carvedilol the prior evening. She woke up with chest tightness and was hypertensive between 180 and 214 systolic. Her blood pressure on arrival was in the 190s but improved to the 140s on its own when brought to the exam room. Repeat troponin was normal. Urinalysis noted microscopic hematuria without infection. Her BP trended up to the 170s; she was given hydralazine 10 mg IV and Lasix 10 mg IV for peripheral edema. She returned to the ED the following day complaining of headaches. She wished to discontinue carvedilol although she was advised that headaches would likely resolve after 1-2 weeks on the medication. She was referred to cardiology for outpatient workup and treatment of her high blood pressure.   She was seen by Dr. Tomie China 12/07/2022 and her BP was 180/73. Her EKG showed NSR and nonspecific ST-T changes. She was only taking 20 mg furosemide daily. It was noted that she had known intolerance to multiple antihypertensives. She had wheezing with beta-blocker such as carvedilol. With ACE inhibitors she developed swelling and hyponatremia. Clonidine 0.1 mg BID was started. Renal artery dopplers 11/2022 showed no evidence of renal artery stenosis; a cyst was noted in the right upper pole. A murmur was heard on exam; she had an echocardiogram  12/2022 revealing LVEF 60-65%, mild LVH, and grade 1 diastolic dysfunction. There was mild mitral valve regurgitation and no evidence of valvular stenosis.  On 12/16/22 she presented to the ED with complaints of worsening LE edema, SOB, chest tightness, and tingling sensation of her RUE. She also complained of dry mouth and neck rash from clonidine. She had stopped her Lasix about 1 week prior as it was ineffective. CBC and BMP with stable counts, BNP 101.6, and troponin x2 negative. EKG without ischemia, infarction, or arrhythmia. She was instructed to take her clonidine when arriving home and restart her Lasix. The next day she returned to the ED with similar complaints and was found to have hyponatremia to 131. It was felt her symptoms may have had a psychosomatic component. Her clonidine was discontinued and switched to irbesartan. She reported a prior adverse reaction of rash on telmisartan. She again was seen in the ED the following day with hypertensive concerns and was given a trial of bisoprolol 5 mg. On 12/20/22 in the ED she was hypertensive and requested a new medication, but they recommended follow up with her primary providers given her multiple intolerances. She saw her PCP 12/23/22 and her BP was 148/63. They started amlodipine 2.5 mg. She went back to the ED 12/27/22 with cough and Covid-19. She was hypertensive but no changes were made at that time. She saw her PCP 2/23 and blood pressure remained elevated, but they recommended waiting for cardiology follow-up. She followed up with Dr. Tomie China 01/11/2023 and her BP was 154/68. She had stopped taking  her midcations. She was instructed to take amlodipine 2.5 mg BID.  At her initial visit she was started on spironolactone.  She followed up with Gillian Shields, NP. She had been prescribed doxazosin but was afraid to take it due to concern about potential dizziness.  Amlodipine was increased.  She has struggled with multiple medication intolerances and  frequently stops her medications soon after initiating a new medication.  At her visit 04/2023 blood pressure was over 200 systolic.  She did agree to try low-dose doxazosin.  She was hospitalized 05/2023 with palpitations and was found to have sinus tachycardia.  Blood pressures were remained uncontrolled.  She started on chlorthalidone and both amlodipine and clonidine were discontinued due to potential side effects but she remained uncontrolled and was not interested in trying any other medications.  She wore an ambulatory monitor that revealed 52 runs of SVT and was recommended to start metoprolol.  At her visit 07/2023 she reported that she had tried half a tablet of metoprolol, but then stopped it after discovering a prior lawsuit online and potentially significant side effects. We discussed this at length, with reassurance. In the office her blood pressure was elevated to 198/85 initially, and 182/68 on manual recheck. She noted that she had tolerated triamterene-HCTZ in the past and wondered if she should retrial this.  She also noted that she was feeling very stressed and anxious.  She did agree to try metoprolol and she was encouraged to work with her provider for stress and anxiety management.  Previous antihypertensives: Beta-Blockers, Carvedilol - Wheezing ACE inhibitors - Swelling, Hyponatremia Lasix - Hyponatremia HCTZ - Hyponatremia Telmisartan - Rash Clonidine  Past Medical History:  Diagnosis Date   Abnormal TSH 12/16/2015   Allergic rhinitis 12/10/2015   Anemia    Arachnoid cyst    Arcus senilis of both eyes 12/10/2015   Atopic dermatitis    Blood transfusion without reported diagnosis    Cardiac murmur 12/07/2022   Carpal tunnel syndrome    Chronic vulvitis    Cortical age-related cataract of both eyes 04/27/2018   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   Edema 12/10/2015   Gastroesophageal reflux disease without esophagitis 06/08/2022   H/O bladder infections    History  of chicken pox    HTN (hypertension), benign 01/03/2022   Hypercholesterolemia    Hyperlipidemia 12/10/2015   Hyperopia with astigmatism and presbyopia, bilateral 12/10/2015   Hypertension    Hypo-osmolality and hyponatremia 01/08/2022   Hypomagnesemia 01/03/2022   Hyponatremia 01/02/2022   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 04/27/2018   Mild intermittent asthma 12/16/2015   Morbid obesity (HCC) 12/07/2022   Need for prophylactic vaccination with Streptococcus pneumoniae (Pneumococcus) and Influenza vaccines 12/16/2015   Normocytic anemia 01/03/2022   Nuclear sclerotic cataract of both eyes 04/25/2017   Obese    Pneumonia, pneumococcal (HCC)    S/P thoracotomy    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     Allergies:   Clonidine derivatives, Hydralazine, Atorvastatin, Hydrochlorothiazide, Losartan, Micardis hct [telmisartan-hctz], Pravastatin, Statins, Telmisartan, Valsartan-hydrochlorothiazide, and Ibuprofen   Social History   Socioeconomic History   Marital status: Divorced  Spouse name: Not on file   Number of children: Not on file   Years of education: Not on file   Highest education level: Not on file  Occupational History   Not on file  Tobacco Use   Smoking status: Never   Smokeless tobacco: Never  Vaping Use   Vaping status: Never Used  Substance and Sexual Activity   Alcohol use: No   Drug use: No   Sexual activity: Never  Other Topics Concern   Not on file  Social History Narrative   Not on file   Social Determinants of Health   Financial Resource Strain: Not on file  Food  Insecurity: No Food Insecurity (06/11/2023)   Hunger Vital Sign    Worried About Running Out of Food in the Last Year: Never true    Ran Out of Food in the Last Year: Never true  Transportation Needs: No Transportation Needs (06/11/2023)   PRAPARE - Administrator, Civil Service (Medical): No    Lack of Transportation (Non-Medical): No  Physical Activity: Insufficiently Active (02/02/2023)   Exercise Vital Sign    Days of Exercise per Week: 5 days    Minutes of Exercise per Session: 20 min  Stress: Not on file  Social Connections: Not on file     Family History: The patient's family history includes Asthma in her mother; COPD in her mother; Cancer in her maternal grandmother; Hypertension in her father; Stroke in her father.  ROS:   Please see the history of present illness.    (+) Stress/Anxiety (+) Rapid palpitations (+) Indigestion All other systems reviewed and are negative.  EKGs/Labs/Other Studies Reviewed:    Monitor 06/2023: Patch Wear Time:  13 days and 15 hours (2024-08-01T15:38:27-0400 to 2024-08-15T07:36:38-0400)   Patient had a min HR of 57 bpm, max HR of 171 bpm, and avg HR of 75 bpm. Predominant underlying rhythm was Sinus Rhythm. Intermittent Bundle Branch Block was present.    52 Supraventricular Tachycardia runs occurred, the run with the fastest interval lasting 5 beats with a max rate of 171 bpm, the longest lasting 19.2 secs with an avg rate of 124 bpm. Supraventricular Tachycardia was detected within +/- 45 seconds of symptomatic patient event(s). Isolated SVEs were rare (<1.0%), SVE Couplets were rare (<1.0%), and no SVE Triplets were present.    Isolated VEs were rare (<1.0%), VE Couplets were rare (<1.0%), and no VE Triplets were present.  Echocardiogram 12/22/2022: Sonographer Comments: Patient is obese. Restricted mobility, study  performed in Fowler's position.   IMPRESSIONS   1. GLS -19.4. Left ventricular ejection fraction, by estimation,  is 60 to  65%. The left ventricle has normal function. The left ventricle has no  regional wall motion abnormalities. There is mild left ventricular  hypertrophy. Left ventricular diastolic  parameters are consistent with Grade I diastolic dysfunction (impaired  relaxation).   2. Right ventricular systolic function is normal. The right ventricular  size is normal.   3. The mitral valve is normal in structure. Mild mitral valve  regurgitation. No evidence of mitral stenosis.   4. The aortic valve is normal in structure. Aortic valve regurgitation is  not visualized. No aortic stenosis is present.   5. The inferior vena cava is normal in size with greater than 50%  respiratory variability, suggesting right atrial pressure of 3 mmHg.   Bilateral Renal Artery Dopplers  12/15/2022: Summary:  Largest Aortic Diameter: 2.3 cm    Renal:    Right: Cyst(s)  noted. RRV flow present. Normal size right kidney.         Abnormal right Resistive Index. Normal cortical thickness of         right kidney. No evidence of right renal artery stenosis.  Left:  Normal size of left kidney. Abnormal left Resisitve Index.         Normal cortical thickness of the left kidney. LRV flow         present. No evidence of left renal artery stenosis.  Mesenteric:  Normal Celiac artery and Superior Mesenteric artery findings.    EKG:  EKG is personally reviewed. 07/26/2023:  Not ordered. 02/02/2023:  Not ordered.  Recent Labs: 09/30/2022: ALT 23 06/02/2023: BNP 101.0 06/13/2023: Magnesium 2.3 07/26/2023: BUN 38; Creatinine, Ser 1.31; Hemoglobin 9.2; Platelets 241; Potassium 4.3; Sodium 139   Recent Lipid Panel No results found for: "CHOL", "TRIG", "HDL", "CHOLHDL", "VLDL", "LDLCALC", "LDLDIRECT"  Physical Exam:    VS:  There were no vitals taken for this visit. , BMI There is no height or weight on file to calculate BMI. GENERAL:  Well appearing HEENT: Pupils equal round and reactive, fundi not visualized, oral  mucosa unremarkable NECK:  No jugular venous distention, waveform within normal limits, carotid upstroke brisk and symmetric, no bruits, no thyromegaly LUNGS:  Clear to auscultation bilaterally HEART:  RRR.  PMI not displaced or sustained,S1 and S2 within normal limits, no S3, no S4, no clicks, no rubs, III/VI systolic murmur at the LUSB ABD:  Flat, positive bowel sounds normal in frequency in pitch, no bruits, no rebound, no guarding, no midline pulsatile mass, no hepatomegaly, no splenomegaly EXT:  2 plus pulses throughout, lymphedema, no cyanosis, no clubbing SKIN:  No rashes, no nodules NEURO:  Cranial nerves II through XII grossly intact, motor grossly intact throughout PSYCH:  Cognitively intact, oriented to person place and time   ASSESSMENT/PLAN:    # HFpEF:          Screening for Secondary Hypertension:      02/02/2023    9:45 AM  Causes  Drugs/Herbals Screened     - Comments No EtOH.  No NSAIDs.  Tries to limit sodium.  1 coffee daily  Renovascular HTN Screened     - Comments Normal 11/2022  Sleep Apnea N/A     - Comments no snoring  Thyroid Disease Screened     - Comments normal 07/2022  Hyperaldosteronism N/A  Pheochromocytoma N/A  Cushing's Syndrome N/A  Hyperparathyroidism N/A  Coarctation of the Aorta Screened  Compliance Screened     - Comments BP higher R>L    Relevant Labs/Studies:    Latest Ref Rng & Units 07/26/2023   11:41 AM 06/13/2023    1:02 AM 06/12/2023    3:54 AM  Basic Labs  Sodium 134 - 144 mmol/L 139  131  132   Potassium 3.5 - 5.2 mmol/L 4.3  4.3  3.4   Creatinine 0.57 - 1.00 mg/dL 0.27  2.53  6.64                    12/15/2022    9:41 AM  Renovascular   Renal Artery [place] Completed Yes      Disposition:    FU in [facility] in 1-2 months.  Medication Adjustments/Labs and Tests Ordered: Current medicines are reviewed at length with the patient today.  Concerns regarding medicines are outlined above.   No orders  of the defined types were placed in this encounter.  No orders of the defined types were placed in this encounter.  I,Mathew Stumpf,acting as a Neurosurgeon for Chilton Si, MD.,have documented all relevant documentation on the behalf of Chilton Si, MD,as directed by  Chilton Si, MD while in the presence of Chilton Si, MD.  I, Bridey Brookover C. Duke Salvia, MD have reviewed all documentation for this visit.  The documentation of the exam, diagnosis, procedures, and orders on 08/17/2023 are all accurate and complete.  Signed, Chilton Si, MD  08/17/2023 8:20 AM    [facility]

## 2023-08-17 NOTE — Patient Instructions (Signed)
Medication Instructions:  INCREASE YOUR CHLORTHALIDONE TO 1 AND 1/2 TABLETS DAILY   Labwork: BMET IN 1 WEEK   Testing/Procedures: NONE  Follow-Up: 1 TO 2 MONTHS WITH PHARM D, CAITLIN W NP, OR DR Hatch IN [facility]IN[facility]

## 2023-08-17 NOTE — Progress Notes (Signed)
Advanced [facility]pertension Clinic Follow-up:    Date:  08/17/2023   ID:  Jenny Martin, DOB 01/24/1946, MRN [id number]  PCP:  Clayborne Dana, NP  Cardiologist:  Chilton Si, MD  Nephrologist:  Referring MD: Clayborne Dana, NP   CC: Hypertension  History of Present Illness:    Jenny Martin is a 77 y.o. female with a hx of hypertension, hyperlipidemia, anemia, GERD, asthma, pneumonia, carpal tunnel syndrome, and obesity, here for follow-up.  She first established care in the [facility] 01/2023. On 12/02/2022 she presented to the ED with complaints of chest pain and shortness of breath. She noted that she had taken her first dose of carvedilol the prior evening. She woke up with chest tightness and was hypertensive between 180 and 214 systolic. Her blood pressure on arrival was in the 190s but improved to the 140s on its own when brought to the exam room. Repeat troponin was normal. Urinalysis noted microscopic hematuria without infection. Her BP trended up to the 170s; she was given hydralazine 10 mg IV and Lasix 10 mg IV for peripheral edema. She returned to the ED the following day complaining of headaches. She wished to discontinue carvedilol although she was advised that headaches would likely resolve after 1-2 weeks on the medication. She was referred to cardiology for outpatient workup and treatment of her high blood pressure.   She was seen by Dr. Tomie China 12/07/2022 and her BP was 180/73. Her EKG showed NSR and nonspecific ST-T changes. She was only taking 20 mg furosemide daily. It was noted that she had known intolerance to multiple antihypertensives. She had wheezing with beta-blocker such as carvedilol. With ACE inhibitors she developed swelling and hyponatremia. Clonidine 0.1 mg BID was started. Renal artery dopplers 11/2022 showed no evidence of renal artery stenosis; a cyst was noted in the right upper pole. A murmur was heard on exam; she had an echocardiogram  12/2022 revealing LVEF 60-65%, mild LVH, and grade 1 diastolic dysfunction. There was mild mitral valve regurgitation and no evidence of valvular stenosis.  On 12/16/22 she presented to the ED with complaints of worsening LE edema, SOB, chest tightness, and tingling sensation of her RUE. She also complained of dry mouth and neck rash from clonidine. She had stopped her Lasix about 1 week prior as it was ineffective. CBC and BMP with stable counts, BNP 101.6, and troponin x2 negative. EKG without ischemia, infarction, or arrhythmia. She was instructed to take her clonidine when arriving home and restart her Lasix. The next day she returned to the ED with similar complaints and was found to have hyponatremia to 131. It was felt her symptoms may have had a psychosomatic component. Her clonidine was discontinued and switched to irbesartan. She reported a prior adverse reaction of rash on telmisartan. She again was seen in the ED the following day with hypertensive concerns and was given a trial of bisoprolol 5 mg. On 12/20/22 in the ED she was hypertensive and requested a new medication, but they recommended follow up with her primary providers given her multiple intolerances. She saw her PCP 12/23/22 and her BP was 148/63. They started amlodipine 2.5 mg. She went back to the ED 12/27/22 with cough and Covid-19. She was hypertensive but no changes were made at that time. She saw her PCP 2/23 and blood pressure remained elevated, but they recommended waiting for cardiology follow-up. She followed up with Dr. Tomie China 01/11/2023 and her BP was 154/68. She had stopped taking  her midcations. She was instructed to take amlodipine 2.5 mg BID.  At her initial visit she was started on spironolactone.  She followed up with Gillian Shields, NP. She had been prescribed doxazosin but was afraid to take it due to concern about potential dizziness.  Amlodipine was increased.  She has struggled with multiple medication intolerances and  frequently stops her medications soon after initiating a new medication.  At her visit 04/2023 blood pressure was over 200 systolic.  She did agree to try low-dose doxazosin.  She was hospitalized 05/2023 with palpitations and was found to have sinus tachycardia.  Blood pressures were remained uncontrolled.  She started on chlorthalidone and both amlodipine and clonidine were discontinued due to potential side effects but she remained uncontrolled and was not interested in trying any other medications.  She wore an ambulatory monitor that revealed 52 runs of SVT and was recommended to start metoprolol.  At her visit 07/2023 she reported that she had tried half a tablet of metoprolol, but then stopped it after discovering a prior lawsuit online and potentially significant side effects. We discussed this at length, with reassurance. In the office her blood pressure was elevated to 198/85 initially, and 182/68 on manual recheck. She noted that she had tolerated triamterene-HCTZ in the past and wondered if she should retrial this.  She also noted that she was feeling very stressed and anxious.  She did agree to try metoprolol and she was encouraged to work with her provider for stress and anxiety management.  Today, she reports her home blood pressures have been averaging in the 140s systolic. In the office her blood pressure is 171/71 initially, and 178/68 on manual recheck. She has not yet taken her antihypertensives this morning and she is feeling stressed regarding the drive to her appointment. Lately she has noticed that her blood pressures may be higher or lower dependent on her diet and sodium intake. Generally she has been more conscientious of her sodium intake; more foods taste too salty to her so she isn't using much salt. She plans to return to drinking 1 cup of coffee daily. If she stays hydrated and drinks enough water, she doesn't notice hearing her heart beat in her ears. For exercise she has been using  her Eclipse for up to an hour, about 3-4 times per week. She is able to achieve increased heart rates and denies any anginal symptoms. She complains of chronic RLE pain due to a prior LE fracture. For pain management she takes tylenol every now and then which helps. She denies any palpitations, chest pain, shortness of breath, peripheral edema, lightheadedness, headaches, syncope, orthopnea, or PND.  Previous antihypertensives: Beta-Blockers, Carvedilol - Wheezing ACE inhibitors - Swelling, Hyponatremia Lasix - Hyponatremia HCTZ - Hyponatremia Telmisartan - Rash Clonidine  Past Medical History:  Diagnosis Date   Abnormal TSH 12/16/2015   Allergic rhinitis 12/10/2015   Anemia    Arachnoid cyst    Arcus senilis of both eyes 12/10/2015   Atopic dermatitis    Blood transfusion without reported diagnosis    Cardiac murmur 12/07/2022   Carpal tunnel syndrome    Chronic vulvitis    Cortical age-related cataract of both eyes 04/27/2018   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   Edema 12/10/2015   Gastroesophageal reflux disease without esophagitis 06/08/2022   H/O bladder infections    History of chicken pox    HTN (hypertension), benign 01/03/2022   Hypercholesterolemia    Hyperlipidemia 12/10/2015   Hyperopia with astigmatism and  presbyopia, bilateral 12/10/2015   Hypertension    Hypo-osmolality and hyponatremia 01/08/2022   Hypomagnesemia 01/03/2022   Hyponatremia 01/02/2022   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 04/27/2018   Mild intermittent asthma 12/16/2015   Morbid obesity (HCC) 12/07/2022   Need for prophylactic vaccination with Streptococcus pneumoniae (Pneumococcus) and Influenza vaccines 12/16/2015   Normocytic anemia 01/03/2022   Nuclear sclerotic cataract of both eyes 04/25/2017   Obese    Pneumonia, pneumococcal (HCC)    S/P thoracotomy    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   albuterol (VENTOLIN HFA) 108 (90 Base) MCG/ACT inhaler Inhale 2 puffs into the lungs every 6 (six) hours as needed for shortness of breath or wheezing.   aspirin 325 MG tablet Take 325 mg by mouth every 6 (six) hours as needed for mild pain or moderate pain.   ezetimibe (ZETIA) 10 MG tablet Take 1 tablet (10 mg total) by mouth daily.   FAMOTIDINE PO Take 1 tablet by mouth daily as needed (Indigestion).   ferrous sulfate 325 (65 FE) MG tablet Take 1 tablet (325 mg total) by mouth every other day.   furosemide (LASIX) 40 MG tablet Take 0.5 tablets (20 mg total) by mouth 2 (two) times daily as needed for edema or fluid.   Magnesium 500 MG TABS Take 1 tablet by mouth every other day.   metoprolol tartrate (LOPRESSOR) 25 MG tablet Take 1 tablet (25 mg total) by mouth 2 (two) times daily.   Multiple Vitamins-Minerals (CENTRUM MINIS ADULTS 50+) TABS Take 1 tablet by mouth daily in the afternoon.   triamcinolone cream (KENALOG) 0.1 % Apply 1 Application topically 2 (two) times daily as needed (for atopic dermatitis- affected areas).   [DISCONTINUED] chlorthalidone (HYGROTON) 25 MG tablet Take 1 tablet (25 mg total) by mouth daily.     Allergies:   Clonidine derivatives, Hydralazine, Atorvastatin, Hydrochlorothiazide, Losartan, Micardis hct [telmisartan-hctz], Pravastatin, Statins, Telmisartan, Valsartan-hydrochlorothiazide, and Ibuprofen   Social History   Socioeconomic History   Marital status: Divorced    Spouse name: Not on file   Number of children: Not on file   Years of education: Not on file   Highest education level: Not on file  Occupational History    Not on file  Tobacco Use   Smoking status: Never   Smokeless tobacco: Never  Vaping Use   Vaping status: Never Used  Substance and Sexual Activity   Alcohol use: No   Drug use: No   Sexual activity: Never  Other Topics Concern   Not on file  Social History Narrative   Not on file   Social Determinants of Health   Financial Resource Strain: Not on file  Food Insecurity: No Food Insecurity (06/11/2023)   Hunger Vital Sign    Worried About Running Out of Food in the Last Year: Never true    Ran Out of Food in the Last Year: Never true  Transportation Needs: No Transportation Needs (06/11/2023)   PRAPARE - Transportation    Lack of Transportation (Medical): No    Lack  of Transportation (Non-Medical): No  Physical Activity: Insufficiently Active (02/02/2023)   Exercise Vital Sign    Days of Exercise per Week: 5 days    Minutes of Exercise per Session: 20 min  Stress: Not on file  Social Connections: Not on file     Family History: The patient's family history includes Asthma in her mother; COPD in her mother; Cancer in her maternal grandmother; Hypertension in her father; Stroke in her father.  ROS:   Please see the history of present illness.    (+) Stress (+) Right LE pain All other systems reviewed and are negative.  EKGs/Labs/Other Studies Reviewed:    Monitor 06/2023: Patch Wear Time:  13 days and 15 hours (2024-08-01T15:38:27-0400 to 2024-08-15T07:36:38-0400)   Patient had a min HR of 57 bpm, max HR of 171 bpm, and avg HR of 75 bpm. Predominant underlying rhythm was Sinus Rhythm. Intermittent Bundle Branch Block was present.    52 Supraventricular Tachycardia runs occurred, the run with the fastest interval lasting 5 beats with a max rate of 171 bpm, the longest lasting 19.2 secs with an avg rate of 124 bpm. Supraventricular Tachycardia was detected within +/- 45 seconds of symptomatic patient event(s). Isolated SVEs were rare (<1.0%), SVE Couplets were rare (<1.0%),  and no SVE Triplets were present.    Isolated VEs were rare (<1.0%), VE Couplets were rare (<1.0%), and no VE Triplets were present.  Echocardiogram 12/22/2022: Sonographer Comments: Patient is obese. Restricted mobility, study  performed in Fowler's position.   IMPRESSIONS   1. GLS -19.4. Left ventricular ejection fraction, by estimation, is 60 to  65%. The left ventricle has normal function. The left ventricle has no  regional wall motion abnormalities. There is mild left ventricular  hypertrophy. Left ventricular diastolic  parameters are consistent with Grade I diastolic dysfunction (impaired  relaxation).   2. Right ventricular systolic function is normal. The right ventricular  size is normal.   3. The mitral valve is normal in structure. Mild mitral valve  regurgitation. No evidence of mitral stenosis.   4. The aortic valve is normal in structure. Aortic valve regurgitation is  not visualized. No aortic stenosis is present.   5. The inferior vena cava is normal in size with greater than 50%  respiratory variability, suggesting right atrial pressure of 3 mmHg.   Bilateral Renal Artery Dopplers  12/15/2022: Summary:  Largest Aortic Diameter: 2.3 cm    Renal:  Right: Cyst(s) noted. RRV flow present. Normal size right kidney.         Abnormal right Resistive Index. Normal cortical thickness of         right kidney. No evidence of right renal artery stenosis.  Left:  Normal size of left kidney. Abnormal left Resisitve Index.         Normal cortical thickness of the left kidney. LRV flow         present. No evidence of left renal artery stenosis.  Mesenteric:  Normal Celiac artery and Superior Mesenteric artery findings.    EKG:  EKG is personally reviewed. 08/17/2023: Not ordered. 07/26/2023:  Not ordered. 02/02/2023:  Not ordered.  Recent Labs: 09/30/2022: ALT 23 06/02/2023: BNP 101.0 06/13/2023: Magnesium 2.3 07/26/2023: BUN 38; Creatinine, Ser 1.31; Hemoglobin 9.2;  Platelets 241; Potassium 4.3; Sodium 139   Recent Lipid Panel No results found for: "CHOL", "TRIG", "HDL", "CHOLHDL", "VLDL", "LDLCALC", "LDLDIRECT"  Physical Exam:    VS:  BP (!) 178/68 (BP Location: Right Arm, Patient Position: Sitting, Cuff  Size: Large)   Pulse 67   Ht 5\' 5"  (1.651 m)   Wt 267 lb 6.4 oz (121.3 kg)   SpO2 92%   BMI 44.50 kg/m  , BMI Body mass index is 44.5 kg/m. GENERAL:  Well appearing HEENT: Pupils equal round and reactive, fundi not visualized, oral mucosa unremarkable NECK:  No jugular venous distention, waveform within normal limits, carotid upstroke brisk and symmetric, no bruits, no thyromegaly LUNGS:  Clear to auscultation bilaterally HEART:  RRR.  PMI not displaced or sustained,S1 and S2 within normal limits, no S3, no S4, no clicks, no rubs, II/VI systolic murmur at the LUSB ABD:  Flat, positive bowel sounds normal in frequency in pitch, no bruits, no rebound, no guarding, no midline pulsatile mass, no hepatomegaly, no splenomegaly EXT:  2 plus pulses throughout, no LE edema, no cyanosis, no clubbing SKIN:  No rashes, no nodules NEURO:  Cranial nerves II through XII grossly intact, motor grossly intact throughout PSYCH:  Cognitively intact, oriented to person place and time   ASSESSMENT/PLAN:    Assessment and Plan    # Hypertension Elevated blood pressure readings at home (140s) and in clinic today (178/68). Patient reports dietary sodium intake influences blood pressure. Currently on Metoprolol 25mg  BID and Chlorthalidone 25mg  daily. No reported side effects.  Limited options given multiple medication intolerances.   -Increase Chlorthalidone to 37.5mg  daily.  She wants to do this prior to increasing metoprolol.   She doesn't qualify for KARDIA-3 due to multiple medication intolerances. -Check basic metabolic panel in 1 week. -Follow up in 1-2 months to reassess blood pressure control. - Encourage regular exercise    # Hyperlipidemia:  Continue  Zetia.  General Health Maintenance Patient reports needing a mammogram and a possible upper GI study. -She will f/u with her primary care provider for mammogram referral and further evaluation of need for upper GI study.        Screening for Secondary Hypertension:      02/02/2023    9:45 AM  Causes  Drugs/Herbals Screened     - Comments No EtOH.  No NSAIDs.  Tries to limit sodium.  1 coffee daily  Renovascular HTN Screened     - Comments Normal 11/2022  Sleep Apnea N/A     - Comments no snoring  Thyroid Disease Screened     - Comments normal 07/2022  Hyperaldosteronism N/A  Pheochromocytoma N/A  Cushing's Syndrome N/A  Hyperparathyroidism N/A  Coarctation of the Aorta Screened  Compliance Screened     - Comments BP higher R>L    Relevant Labs/Studies:    Latest Ref Rng & Units 07/26/2023   11:41 AM 06/13/2023    1:02 AM 06/12/2023    3:54 AM  Basic Labs  Sodium 134 - 144 mmol/L 139  131  132   Potassium 3.5 - 5.2 mmol/L 4.3  4.3  3.4   Creatinine 0.57 - 1.00 mg/dL 0.16  0.10  9.32                    12/15/2022    9:41 AM  Renovascular   Renal Artery [place] Completed Yes      Disposition:    FU in Advanced Hypertension Clinic in 1-2 months.  Medication Adjustments/Labs and Tests Ordered: Current medicines are reviewed at length with the patient today.  Concerns regarding medicines are outlined above.   Orders Placed This Encounter  Procedures   Basic metabolic panel   Meds ordered this encounter  Medications   chlorthalidone (HYGROTON) 25 MG tablet    Sig: TAKE 1 AND 1/2 TABLETS DAILY    Dispense:  30 tablet    Refill:  2    NEW DOSE, D/C PREVIOUS RX   I,Mathew Stumpf,acting as a scribe for Chilton Si, MD.,have documented all relevant documentation on the behalf of Chilton Si, MD,as directed by  Chilton Si, MD while in the presence of Chilton Si, MD.  I, Richardson Dubree C. Duke Salvia, MD have reviewed all documentation for this visit.  The  documentation of the exam, diagnosis, procedures, and orders on 08/17/2023 are all accurate and complete.  Signed, Chilton Si, MD  08/17/2023 2:29 PM    [facility]

## 2023-08-25 LAB — BASIC METABOLIC PANEL
BUN/Creatinine Ratio: 34 — ABNORMAL HIGH (ref 12–28)
BUN: 49 mg/dL — ABNORMAL HIGH (ref 8–27)
CO2: 25 mmol/L (ref 20–29)
Calcium: 9.1 mg/dL (ref 8.7–10.3)
Chloride: 101 mmol/L (ref 96–106)
Creatinine, Ser: 1.43 mg/dL — ABNORMAL HIGH (ref 0.57–1.00)
Glucose: 82 mg/dL (ref 70–99)
Potassium: 4.2 mmol/L (ref 3.5–5.2)
Sodium: 140 mmol/L (ref 134–144)
eGFR: 38 mL/min/{1.73_m2} — ABNORMAL LOW (ref >59)

## 2023-09-17 ENCOUNTER — Encounter: Payer: Self-pay | Admitting: Family Medicine

## 2023-09-20 ENCOUNTER — Encounter: Payer: Self-pay | Admitting: Family Medicine

## 2023-09-20 ENCOUNTER — Ambulatory Visit (INDEPENDENT_AMBULATORY_CARE_PROVIDER_SITE_OTHER): Payer: Medicare Other | Admitting: Family Medicine

## 2023-09-20 VITALS — BP 155/62 | HR 72 | Ht 65.0 in | Wt 265.0 lb

## 2023-09-20 DIAGNOSIS — Z780 Asymptomatic menopausal state: Secondary | ICD-10-CM | POA: Diagnosis not present

## 2023-09-20 DIAGNOSIS — R09A2 Foreign body sensation, throat: Secondary | ICD-10-CM | POA: Diagnosis not present

## 2023-09-20 DIAGNOSIS — K219 Gastro-esophageal reflux disease without esophagitis: Secondary | ICD-10-CM

## 2023-09-20 DIAGNOSIS — R1310 Dysphagia, unspecified: Secondary | ICD-10-CM

## 2023-09-20 DIAGNOSIS — Z1231 Encounter for screening mammogram for malignant neoplasm of breast: Secondary | ICD-10-CM

## 2023-09-20 MED ORDER — PANTOPRAZOLE SODIUM 40 MG PO TBEC
40.0000 mg | DELAYED_RELEASE_TABLET | Freq: Every day | ORAL | 3 refills | Status: DC
Start: 2023-09-20 — End: 2024-01-17

## 2023-09-20 NOTE — Progress Notes (Signed)
Acute Office Visit  Subjective:     Patient ID: Jenny Martin, female    DOB: 09/05/1946, 77 y.o.   MRN: [id number]  Chief Complaint  Patient presents with   Medical Management of Chronic Issues    HPI Patient is in today for globus sensation after swallowing a fish bone.   Discussed the use of AI scribe software for clinical note transcription with the patient, who gave verbal consent to proceed.  History of Present Illness   The patient presents with a persistent sensation of a foreign body in the throat, which they attribute to swallowing a fish bone a month or two ago. Despite attempts to dislodge the perceived obstruction with bread, the sensation has persisted. The patient reports a gradual movement of the sensation from the upper to the middle of the neck. They also report frequent choking on food and water, necessitating coughing to clear the airway.  In addition to the foreign body sensation, the patient has been experiencing symptoms of acid reflux, including burning and burping, which they believe may be contributing to the throat discomfort. They have been managing the reflux with daily Pepcid, which only partially alleviates the symptoms.  The patient also reports a history of constipation, which was severe enough to warrant an emergency room visit at an out-of-town hospital. The constipation has since improved with the use of a stool softener.  The patient has a history of blood pressure issues, with multiple medication adjustments due to allergic reactions. They are currently on a regimen that is well-tolerated, but the dosage is still being adjusted. The patient denies any chest pain or heart-related symptoms.             ROS All review of systems negative except what is listed in the HPI      Objective:    BP (!) 155/62   Pulse 72   Ht 5\' 5"  (1.651 m)   Wt 265 lb (120.2 kg)   SpO2 99%   BMI 44.10 kg/m    Physical Exam Vitals reviewed.   Constitutional:      Appearance: Normal appearance.  Cardiovascular:     Rate and Rhythm: Normal rate and regular rhythm.  Pulmonary:     Effort: Pulmonary effort is normal.     Breath sounds: Normal breath sounds.  Musculoskeletal:     Cervical back: Normal range of motion and neck supple. No tenderness.  Lymphadenopathy:     Cervical: No cervical adenopathy.  Neurological:     Mental Status: She is alert and oriented to person, place, and time.  Psychiatric:        Mood and Affect: Mood normal.        Behavior: Behavior normal.        Thought Content: Thought content normal.        Judgment: Judgment normal.     No results found for any visits on 09/20/23.      Assessment & Plan:   Problem List Items Addressed This Visit   None Visit Diagnoses     Dysphagia, unspecified type    -  Primary   Relevant Orders   Ambulatory referral to Gastroenterology   Encounter for screening mammogram for malignant neoplasm of breast       Relevant Orders   MM 3D SCREENING MAMMOGRAM BILATERAL BREAST   Globus sensation       Relevant Orders   Ambulatory referral to Gastroenterology   Gastroesophageal reflux disease, unspecified whether esophagitis present  Relevant Medications   pantoprazole (PROTONIX) 40 MG tablet   Other Relevant Orders   Ambulatory referral to Gastroenterology   Postmenopausal       Relevant Orders   DG Bone Density      Stable on exam today. Airway clear.  Will place urgent GI referral as she likely needs endoscopy. Adding Protonix for reflux Discussed safety and supportive measures.  Patient aware of signs/symptoms requiring further/urgent evaluation.   Due for routine mammogram and DEXA. Orders placed Continue following with cardiology/HTN clinic for medication management.      Meds ordered this encounter  Medications   pantoprazole (PROTONIX) 40 MG tablet    Sig: Take 1 tablet (40 mg total) by mouth daily.    Dispense:  30 tablet     Refill:  3    Order Specific Question:   Supervising Provider    Answer:   Danise Edge A [[id number]]    Return if symptoms worsen or fail to improve.  Clayborne Dana, NP

## 2023-09-23 ENCOUNTER — Ambulatory Visit
Admission: RE | Admit: 2023-09-23 | Discharge: 2023-09-23 | Disposition: A | Discharge status: Home / Self Care | Payer: Medicare Other | Source: Ambulatory Visit | Attending: Internal Medicine | Admitting: Internal Medicine

## 2023-09-23 ENCOUNTER — Ambulatory Visit (INDEPENDENT_AMBULATORY_CARE_PROVIDER_SITE_OTHER): Payer: Medicare Other | Admitting: Internal Medicine

## 2023-09-23 ENCOUNTER — Encounter: Payer: Self-pay | Admitting: Internal Medicine

## 2023-09-23 VITALS — BP 136/84 | HR 59 | Ht 65.0 in | Wt 263.1 lb

## 2023-09-23 DIAGNOSIS — T18108A Unspecified foreign body in esophagus causing other injury, initial encounter: Secondary | ICD-10-CM | POA: Diagnosis not present

## 2023-09-23 DIAGNOSIS — K5900 Constipation, unspecified: Secondary | ICD-10-CM

## 2023-09-23 DIAGNOSIS — R1310 Dysphagia, unspecified: Secondary | ICD-10-CM

## 2023-09-23 MED ORDER — FAMOTIDINE 20 MG PO TABS: 20.0000 mg | ORAL_TABLET | Freq: Two times a day (BID) | ORAL | 11 refills | Status: DC

## 2023-09-23 NOTE — Patient Instructions (Signed)
We have sent the following medications to your pharmacy for you to pick up at your convenience: famotidine 20 mg twice daily.   Your provider has requested that you go to the basement level for an x-ray before leaving today. Press "B" on the elevator.   You have been scheduled for an endoscopy. Please follow written instructions given to you at your visit today.  If you use inhalers (even only as needed), please bring them with you on the day of your procedure.  If you take any of the following medications, they will need to be adjusted prior to your procedure:   DO NOT TAKE 7 DAYS PRIOR TO TEST- Trulicity (dulaglutide) Ozempic, Wegovy (semaglutide) Mounjaro (tirzepatide) Bydureon Bcise (exanatide extended release)  DO NOT TAKE 1 DAY PRIOR TO YOUR TEST Rybelsus (semaglutide) Adlyxin (lixisenatide) Victoza (liraglutide) Byetta (exanatide) ___________________________________________________________________________   _______________________________________________________  If your blood pressure at your visit was 140/90 or greater, please contact your primary care physician to follow up on this.  _______________________________________________________  If you are age 59 or older, your body mass index should be between 23-30. Your Body mass index is 43.79 kg/m. If this is out of the aforementioned range listed, please consider follow up with your Primary Care Provider.  If you are age 8 or younger, your body mass index should be between 19-25. Your Body mass index is 43.79 kg/m. If this is out of the aformentioned range listed, please consider follow up with your Primary Care Provider.   ________________________________________________________  The [facility] providers would like to encourage you to use [facility] to communicate with providers for non-urgent requests or questions.  Due to long hold times on the telephone, sending your provider a message by [facility] may be a faster and  more efficient way to get a response.  Please allow 48 business hours for a response.  Please remember that this is for non-urgent requests.  _______________________________________________________

## 2023-09-23 NOTE — Progress Notes (Signed)
HPI: Discussed the use of AI scribe software for clinical note transcription with the patient, who gave verbal consent to proceed.  History of Present Illness         Jenny Martin, a 77 year old female with a history of hypertension, hyperlipidemia, GERD, and chronic heart failure with preserved ejection fraction, presents to the clinic for evaluation of reflux symptoms and dysphagia. The patient reports a new onset of reflux symptoms within the last few months, which she attributes to adverse reactions from various blood pressure medications. She is currently on metoprolol and chlorthalidone, which seem to be effective in managing her blood pressure.  The patient also reports constipation, which she believes is due to the iron and potassium supplements she is taking. She has found some relief with Dulcolax stool softener, but continues to experience constipation. The patient also reports a sensation of a fishbone stuck in her throat for several months, which she initially thought would resolve on its own.  This occurred acute, and though improved still feels "irritated".  This sensation is accompanied by burning at times and is exacerbated by constipation.  The patient has been taking over-the-counter Pepcid for reflux, which provides intermittent relief. She has not started pantoprazole due to concerns about side effects. The patient also reports difficulty swallowing and choking on food, requiring her to take smaller bites and drink water with meals. She has lost approximately 10-15 pounds, which has since stabilized.  The patient had a colonoscopy two years ago, which showed diverticulosis in the sigmoid and small internal hemorrhoids. The patient was recommended a 10-year repeat colonoscopy for screening, but this would be discussed further due to her age. Past Medical History:  Diagnosis Date   Abnormal TSH 12/16/2015   Allergic rhinitis 12/10/2015   Anemia    Arachnoid cyst    Arcus  senilis of both eyes 12/10/2015   Atopic dermatitis    Blood transfusion without reported diagnosis    Cardiac murmur 12/07/2022   Carpal tunnel syndrome    Chronic vulvitis    Cortical age-related cataract of both eyes 04/27/2018   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   Edema 12/10/2015   Gastroesophageal reflux disease without esophagitis 06/08/2022   H/O bladder infections    History of chicken pox    HTN (hypertension), benign 01/03/2022   Hypercholesterolemia    Hyperlipidemia 12/10/2015   Hyperopia with astigmatism and presbyopia, bilateral 12/10/2015   Hypertension    Hypo-osmolality and hyponatremia 01/08/2022   Hypomagnesemia 01/03/2022   Hyponatremia 01/02/2022   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 04/27/2018   Mild intermittent asthma 12/16/2015   Morbid obesity (HCC) 12/07/2022   Need for prophylactic vaccination with Streptococcus pneumoniae (Pneumococcus) and Influenza vaccines 12/16/2015   Normocytic anemia 01/03/2022   Nuclear sclerotic cataract of both eyes 04/25/2017   Obese    Pneumonia, pneumococcal (HCC)    S/P thoracotomy    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     albuterol (VENTOLIN HFA) 108 (90 Base) MCG/ACT inhaler Inhale 2 puffs into the lungs every 6 (  six) hours as needed for shortness of breath or wheezing. 8 g 1   aspirin 325 MG tablet Take 325 mg by mouth every 6 (six) hours as needed for mild pain or moderate pain.     chlorthalidone (HYGROTON) 25 MG tablet TAKE 1 AND 1/2 TABLETS DAILY 30 tablet 2    ezetimibe (ZETIA) 10 MG tablet Take 1 tablet (10 mg total) by mouth daily. 90 tablet 3   FAMOTIDINE PO Take 1 tablet by mouth daily as needed (Indigestion).     ferrous sulfate 325 (65 FE) MG tablet Take 1 tablet (325 mg total) by mouth every other day.     furosemide (LASIX) 40 MG tablet Take 0.5 tablets (20 mg total) by mouth 2 (two) times daily as needed for edema or fluid. 30 tablet 3   Magnesium 500 MG TABS Take 1 tablet by mouth every other day.     Multiple Vitamins-Minerals (CENTRUM MINIS ADULTS 50+) TABS Take 1 tablet by mouth daily in the afternoon.     triamcinolone cream (KENALOG) 0.1 % Apply 1 Application topically 2 (two) times daily as needed (for atopic dermatitis- affected areas). 30 g    metoprolol tartrate (LOPRESSOR) 25 MG tablet Take 1 tablet (25 mg total) by mouth 2 (two) times daily. (Patient not taking: Reported on 09/23/2023) 180 tablet 3   pantoprazole (PROTONIX) 40 MG tablet Take 1 tablet (40 mg total) by mouth daily. (Patient not taking: Reported on 09/23/2023) 30 tablet 3   No facility-administered medications prior to visit.    Allergies  Allergen Reactions   Clonidine Derivatives Shortness Of Breath   Hydralazine Shortness Of Breath   Atorvastatin Other (See Comments)    Myalgias   Hydrochlorothiazide Other (See Comments)    It drys me out.   Losartan Other (See Comments)    Headaches    Micardis Hct [Telmisartan-Hctz] Swelling and Other (See Comments)    Ankles and legs swell   Pravastatin Other (See Comments)    Myalgias   Statins Other (See Comments)    Cramping and muscle pain   Telmisartan Hives   Valsartan-Hydrochlorothiazide Other (See Comments)    Made the chest burn   Ibuprofen Hives and Rash    Family History  Problem Relation Age of Onset   COPD Mother    Asthma Mother    Hypertension Father    Stroke Father    Cancer Maternal Grandmother     Social History   Tobacco Use   Smoking status: Never   Smokeless tobacco: Never   Vaping Use   Vaping status: Never Used  Substance Use Topics   Alcohol use: No   Drug use: No    ROS: As per history of present illness, otherwise negative  BP 136/84 (BP Location: Left Arm, Patient Position: Sitting, Cuff Size: Large)   Pulse (!) 59   Ht 5\' 5"  (1.651 m)   Wt 263 lb 2 oz (119.4 kg)   SpO2 95%   BMI 43.79 kg/m  Gen: awake, alert, NAD HEENT: anicteric  CV: RRR, no mrg Pulm: CTA b/l Abd: soft, NT/ND, +BS throughout Ext: no c/c/e Neuro: nonfocal   RELEVANT LABS AND IMAGING: CBC    Component Value Date/Time   WBC 4.5 07/26/2023 1141   WBC 4.7 06/13/2023 0102   RBC 3.15 (L) 07/26/2023 1141   RBC 2.92 (L) 06/13/2023 0102   HGB 9.2 (L) 07/26/2023 1141   HCT 28.4 (L) 07/26/2023 1141   PLT 241 07/26/2023 1141  MCV 90 07/26/2023 1141   MCH 29.2 07/26/2023 1141   MCH 29.1 06/13/2023 0102   MCHC 32.4 07/26/2023 1141   MCHC 32.2 06/13/2023 0102   RDW 13.2 07/26/2023 1141   LYMPHSABS 0.8 07/26/2023 1141   MONOABS 0.5 12/27/2022 2001   EOSABS 0.3 07/26/2023 1141   BASOSABS 0.1 07/26/2023 1141    CMP     Component Value Date/Time   NA 140 08/24/2023 1025   K 4.2 08/24/2023 1025   CL 101 08/24/2023 1025   CO2 25 08/24/2023 1025   GLUCOSE 82 08/24/2023 1025   GLUCOSE 98 06/13/2023 0102   BUN 49 (H) 08/24/2023 1025   CREATININE 1.43 (H) 08/24/2023 1025   CALCIUM 9.1 08/24/2023 1025   PROT 8.5 (H) 09/30/2022 0505   ALBUMIN 3.4 (L) 09/30/2022 0505   AST 34 09/30/2022 0505   ALT 23 09/30/2022 0505   ALKPHOS 48 09/30/2022 0505   BILITOT 0.4 09/30/2022 0505   GFRNONAA 36 (L) 06/13/2023 0102   GFRAA >60 10/20/2016 2130   Iron/TIBC/Ferritin/ %Sat    Component Value Date/Time   IRON 36 06/12/2023 0354   TIBC 286 06/12/2023 0354   FERRITIN 18 06/12/2023 0354   IRONPCTSAT 13 06/12/2023 0354     Results     DIAGNOSTIC Colonoscopy: Diverticulosis in the sigmoid and small internal hemorrhoids. No polyps. (12/04/2020)      ASSESSMENT/PLAN: Assessment and Plan      Foreign Body Sensation in Throat Patient reports sensation of a fishbone stuck in her throat for several months, associated with reflux symptoms. No acute distress. -Order neck and chest X-ray today to evaluate for foreign body. -Schedule upper endoscopy to directly visualize esophagus and stomach.  Gastroesophageal Reflux Disease (GERD) New onset, associated with dysphagia and sensation of foreign body in throat. Currently on Pepcid with partial relief. -Increase Pepcid to twice daily until endoscopy.  Constipation Likely secondary to iron supplementation. Patient reports improvement with Dulcolax stool softener. -Continue Dulcolax as needed for constipation.  Hypertension Managed with Metoprolol and Chlorthalidone. Patient reports frequent urination, especially at night. -Continue current regimen.  Hyperlipidemia Managed with Zetia. -Continue Zetia.  Colonoscopy Last colonoscopy on 12/04/2020 showed diverticulosis in the sigmoid and small internal hemorrhoids. 10-year repeat recommended for screening, to be discussed with patient closer to due date.  Follow-up After upper endoscopy and X-ray results.       ZO:XWRU, Lollie Marrow, Np [address] [place],  [place] [address]

## 2023-09-26 ENCOUNTER — Encounter: Payer: Self-pay | Admitting: Internal Medicine

## 2023-09-29 ENCOUNTER — Encounter (HOSPITAL_BASED_OUTPATIENT_CLINIC_OR_DEPARTMENT_OTHER): Payer: Self-pay | Admitting: Cardiovascular Disease

## 2023-09-29 MED ORDER — CHLORTHALIDONE 25 MG PO TABS: ORAL_TABLET | ORAL | 1 refills | Status: DC

## 2023-10-03 ENCOUNTER — Ambulatory Visit (HOSPITAL_BASED_OUTPATIENT_CLINIC_OR_DEPARTMENT_OTHER): Payer: Medicare Other

## 2023-10-06 ENCOUNTER — Encounter: Payer: Self-pay | Admitting: Internal Medicine

## 2023-10-06 ENCOUNTER — Ambulatory Visit: Payer: Medicare Other | Admitting: Internal Medicine

## 2023-10-06 VITALS — BP 184/85 | HR 72 | Temp 97.3°F | Resp 12 | Ht 65.0 in | Wt 263.0 lb

## 2023-10-06 DIAGNOSIS — R1310 Dysphagia, unspecified: Secondary | ICD-10-CM

## 2023-10-06 DIAGNOSIS — K2900 Acute gastritis without bleeding: Secondary | ICD-10-CM

## 2023-10-06 DIAGNOSIS — K2950 Unspecified chronic gastritis without bleeding: Secondary | ICD-10-CM

## 2023-10-06 DIAGNOSIS — K219 Gastro-esophageal reflux disease without esophagitis: Secondary | ICD-10-CM

## 2023-10-06 MED ORDER — SODIUM CHLORIDE 0.9 % IV SOLN: 500.0000 mL | INTRAVENOUS | Status: DC

## 2023-10-06 NOTE — Progress Notes (Signed)
Called to room to assist during endoscopic procedure.  Patient ID and intended procedure confirmed with present staff. Received instructions for my participation in the procedure from the performing physician.  

## 2023-10-06 NOTE — Progress Notes (Signed)
To pacu, VSS. Report to [email or web address] 

## 2023-10-06 NOTE — Progress Notes (Signed)
Patient presenting for upper endoscopy to evaluate reflux but also foreign body sensation  See office note dated 09/23/2023 for details and current H&P  Patient remains appropriate for [facility] upper endoscopy today

## 2023-10-06 NOTE — Op Note (Signed)
[facility] Patient Name: Jenny Martin Procedure Date: 10/06/2023 7:20 AM MRN: [id number] Endoscopist: Beverley Fiedler , MD, [id number] Age: 77 Referring MD:  Date of Birth: 08/29/1946 Gender: Female Account #: [id number] Procedure:                Upper GI endoscopy Indications:              Dysphagia, Suspected gastro-esophageal reflux                            disease, Suspected ingestion of foreign body (fish                            bone) with persistent foreign body sensation Medicines:                Monitored Anesthesia Care Procedure:                Pre-Anesthesia Assessment:                           - Prior to the procedure, a History and Physical                            was performed, and patient medications and                            allergies were reviewed. The patient's tolerance of                            previous anesthesia was also reviewed. The risks                            and benefits of the procedure and the sedation                            options and risks were discussed with the patient.                            All questions were answered, and informed consent                            was obtained. Prior Anticoagulants: The patient has                            taken no anticoagulant or antiplatelet agents. ASA                            Grade Assessment: III - A patient with severe                            systemic disease. After reviewing the risks and                            benefits, the patient was deemed in satisfactory  condition to undergo the procedure.                           After obtaining informed consent, the endoscope was                            passed under direct vision. Throughout the                            procedure, the patient's blood pressure, pulse, and                            oxygen saturations were monitored continuously. The                            Olympus  Scope SN [id number] was introduced through the                            mouth, and advanced to the second part of duodenum.                            The upper GI endoscopy was accomplished without                            difficulty. The patient tolerated the procedure                            well. Scope In: Scope Out: Findings:                 The examined esophagus was normal.                           There is no endoscopic evidence of foreign body in                            the entire esophagus.                           Moderate inflammation characterized by erythema,                            granularity and linear erosions was found in the                            gastric antrum. Biopsies were taken with a cold                            forceps for histology and Helicobacter pylori                            testing.                           The examined duodenum was normal. Complications:  No immediate complications. Estimated Blood Loss:     Estimated blood loss was minimal. Impression:               - Normal esophagus.                           - Acute gastritis. Biopsied.                           - Normal examined duodenum. Recommendation:           - Patient has a contact number available for                            emergencies. The signs and symptoms of potential                            delayed complications were discussed with the                            patient. Return to normal activities tomorrow.                            Written discharge instructions were provided to the                            patient.                           - Resume previous diet.                           - Continue present medications. Continue                            pantoprazole 40 mg daily for now given gastritis                            while we await pathology results.                           - Await pathology results.                            - ENT referral for direct laryngoscopy exam to                            exclude foreign body (fish bone). Beverley Fiedler, MD 10/06/2023 8:08:06 AM This report has been signed electronically.

## 2023-10-06 NOTE — Patient Instructions (Addendum)
Recommendation:           - Patient has a contact number available for                            emergencies. The signs and symptoms of potential                            delayed complications were discussed with the                            patient. Return to normal activities tomorrow.                            Written discharge instructions were provided to the                            patient.                           - Resume previous diet.                           - Continue present medications. Continue                            pantoprazole 40 mg daily for now given gastritis                            while we await pathology results.                           - Await pathology results.                           - ENT referral for direct laryngoscopy exam to                            exclude foreign body (fish bone).  YOU HAD AN ENDOSCOPIC PROCEDURE TODAY AT THE [facility]:   Refer to the procedure report that was given to you for any specific questions about what was found during the examination.  If the procedure report does not answer your questions, please call your gastroenterologist to clarify.  If you requested that your care partner not be given the details of your procedure findings, then the procedure report has been included in a sealed envelope for you to review at your convenience later.  YOU SHOULD EXPECT: Some feelings of bloating in the abdomen. Passage of more gas than usual.  Walking can help get rid of the air that was put into your GI tract during the procedure and reduce the bloating. If you had a lower endoscopy (such as a colonoscopy or flexible sigmoidoscopy) you may notice spotting of blood in your stool or on the toilet paper. If you underwent a bowel prep for your procedure, you may not have a normal bowel movement for a few days.  Please Note:  You might notice some irritation and congestion in your nose or some drainage.  This is  from  the oxygen used during your procedure.  There is no need for concern and it should clear up in a day or so.  SYMPTOMS TO REPORT IMMEDIATELY: Following upper endoscopy (EGD)  Vomiting of blood or coffee ground material  New chest pain or pain under the shoulder blades  Painful or persistently difficult swallowing  New shortness of breath  Fever of 100F or higher  Black, tarry-looking stools  For urgent or emergent issues, a gastroenterologist can be reached at any hour by calling [phone]. Do not use MyChart messaging for urgent concerns.   DIET:  We do recommend a small meal at first, but then you may proceed to your regular diet.  Drink plenty of fluids but you should avoid alcoholic beverages for 24 hours.  ACTIVITY:  You should plan to take it easy for the rest of today and you should NOT DRIVE or use heavy machinery until tomorrow (because of the sedation medicines used during the test).    FOLLOW UP: Our staff will call the number listed on your records the next business day following your procedure.  We will call around 7:15- 8:00 am to check on you and address any questions or concerns that you may have regarding the information given to you following your procedure. If we do not reach you, we will leave a message.     If any biopsies were taken you will be contacted by phone or by letter within the next 1-3 weeks.  Please call us at [phone] if you have not heard about the biopsies in 3 weeks.    SIGNATURES/CONFIDENTIALITY: You and/or your care partner have signed paperwork which will be entered into your electronic medical record.  These signatures attest to the fact that that the information above on your After Visit Summary has been reviewed and is understood.  Full responsibility of the confidentiality of this discharge information lies with you and/or your care-partner.

## 2023-10-07 ENCOUNTER — Telehealth: Payer: Self-pay

## 2023-10-07 ENCOUNTER — Telehealth: Payer: Self-pay | Admitting: *Deleted

## 2023-10-07 NOTE — Telephone Encounter (Signed)
Pt scheduled to see Dr. Jenne Pane with [facility] today at 3:15pm. Spoke with pt and she is aware and knows to be there at 3pm for a 3:15pm appt. She knows to bring photo ID and Insurance card. Being seen for direct laryngoscopy exam to exclude foreign body (fish bone).

## 2023-10-07 NOTE — Telephone Encounter (Signed)
  Follow up Call-     10/06/2023    7:12 AM  Call back number  Post procedure Call Back phone  # [phone]  Permission to leave phone message Yes     Patient questions:  Do you have a fever, pain , or abdominal swelling? No. Pain Score  0 *  Have you tolerated food without any problems? Yes.    Have you been able to return to your normal activities? Yes.    Do you have any questions about your discharge instructions: Diet   No. Medications  No. Follow up visit  No.  Do you have questions or concerns about your Care? No.  Actions: * If pain score is 4 or above: No action needed, pain <4.

## 2023-10-10 ENCOUNTER — Ambulatory Visit (HOSPITAL_BASED_OUTPATIENT_CLINIC_OR_DEPARTMENT_OTHER)
Admission: RE | Admit: 2023-10-10 | Discharge: 2023-10-10 | Disposition: A | Discharge status: Home / Self Care | Payer: Medicare Other | Source: Ambulatory Visit | Attending: Family Medicine | Admitting: Family Medicine

## 2023-10-10 ENCOUNTER — Encounter (HOSPITAL_BASED_OUTPATIENT_CLINIC_OR_DEPARTMENT_OTHER): Payer: Self-pay

## 2023-10-10 DIAGNOSIS — Z780 Asymptomatic menopausal state: Secondary | ICD-10-CM | POA: Diagnosis present

## 2023-10-10 DIAGNOSIS — Z1231 Encounter for screening mammogram for malignant neoplasm of breast: Secondary | ICD-10-CM | POA: Diagnosis present

## 2023-10-10 LAB — SURGICAL PATHOLOGY

## 2023-10-11 ENCOUNTER — Encounter: Payer: Self-pay | Admitting: Internal Medicine

## 2023-10-26 ENCOUNTER — Encounter: Payer: Self-pay | Admitting: Cardiology

## 2023-10-26 ENCOUNTER — Ambulatory Visit: Payer: Medicare Other | Attending: Cardiology | Admitting: Cardiology

## 2023-10-26 VITALS — BP 160/76 | HR 86 | Ht 65.0 in | Wt 258.0 lb

## 2023-10-26 DIAGNOSIS — N1832 Chronic kidney disease, stage 3b: Secondary | ICD-10-CM | POA: Diagnosis not present

## 2023-10-26 DIAGNOSIS — I10 Essential (primary) hypertension: Secondary | ICD-10-CM

## 2023-10-26 DIAGNOSIS — E781 Pure hyperglyceridemia: Secondary | ICD-10-CM

## 2023-10-26 NOTE — Progress Notes (Signed)
Cardiology Office Note:    Date:  10/26/2023   ID:  TAUNI WOODARDS, DOB [birth year], MRN [id number]  PCP:  Clayborne Dana, NP  Cardiologist:  Garwin Brothers, MD   Referring MD: Clayborne Dana, NP    ASSESSMENT:    1. Uncontrolled hypertension   2. CKD stage 3b, GFR 30-44 ml/min (HCC)   3. Morbid obesity (HCC)   4. Pure hypertriglyceridemia    PLAN:    In order of problems listed above:  Primary prevention stressed with the patient.  Importance of compliance with diet medication stressed and patient verbalized standing. Essential hypertension: Blood pressure stable and diet was emphasized.  Lifestyle modification urged.  Salt intake issues were discussed.  She mentions to me that her blood pressure runs much better at home.  She is going to keep a track of it and send [place] the readings in the next week or so. Mixed dyslipidemia: Lipid-lowering medications followed by primary care.  Diet emphasized. Morbid obesity: Weight reduction stressed diet emphasized and she promises to do better. Renal insufficiency: Stable and followed by primary care.  I discussed this with her at length. Patient will be seen in follow-up appointment in 12 months or earlier if the patient has any concerns.    Medication Adjustments/Labs and Tests Ordered: Current medicines are reviewed at length with the patient today.  Concerns regarding medicines are outlined above.  No orders of the defined types were placed in this encounter.  No orders of the defined types were placed in this encounter.    No chief complaint on file.    History of Present Illness:    Jenny Martin is a 77 y.o. female.  Patient has past medical history of uncontrolled hypertension and morbid obesity.  She has renal insufficiency.  She denies any problems at this time and takes care of activities of Martin living.  No chest pain orthopnea or PND.  She saw her hypertension specialist and tells me the medications that were  prescribed are helping her.  She mentions to me that her blood pressure ranges in the numbers of 130/70 at home.  She is happy about it.  At the time of my evaluation, the patient is alert awake oriented and in no distress.  Past Medical History:  Diagnosis Date   Abnormal TSH 12/16/2015   Allergic rhinitis 12/10/2015   Anemia    Arachnoid cyst    Arcus senilis of both eyes 12/10/2015   Atopic dermatitis    Blood transfusion without reported diagnosis    Cardiac murmur 12/07/2022   Carpal tunnel syndrome    Chest pain 06/13/2023   Chronic heart failure with preserved ejection fraction (HFpEF) (HCC) 06/12/2023   Chronic vulvitis    CKD stage 3b, 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   Cortical age-related cataract of both eyes 04/27/2018   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   Edema 12/10/2015   Gastroesophageal reflux disease without esophagitis 06/08/2022   H/O bladder infections    History of chicken pox    Hypercholesterolemia    Hyperlipidemia 12/10/2015   Hyperopia with astigmatism and presbyopia, bilateral 12/10/2015   Hypertension    Hypo-osmolality and hyponatremia 01/08/2022   Hypomagnesemia 01/03/2022   Hyponatremia 01/02/2022   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 04/27/2018   Medication intolerance 06/13/2023   Mild intermittent asthma 12/16/2015   Morbid obesity (HCC) 12/07/2022   Need for prophylactic vaccination with Streptococcus pneumoniae (Pneumococcus) and Influenza vaccines 12/16/2015  Normocytic anemia 01/03/2022   Nuclear sclerotic cataract of both eyes 04/25/2017   Obese    Palpitations 06/11/2023   Renal cyst 01/07/2023   Right renal hypoechoic mass (1.3 cm x 1.3 cm), most likely representing simple cyst noted on Vascular US Renal Artery Duplex on 12/15/22. CT Abdomen/Pelvis from 01/02/22 showed small right renal midpole low density cyst, appearing benign.      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   albuterol (VENTOLIN HFA) 108 (90 Base) MCG/ACT inhaler Inhale 2 puffs into the lungs every 6 (six) hours as needed for shortness of breath or wheezing.   aspirin 325 MG tablet Take 325 mg by mouth every 6 (six) hours as needed for mild pain or moderate pain.   chlorthalidone (HYGROTON) 25 MG tablet TAKE 1 AND 1/2 TABLETS Martin   ezetimibe (ZETIA) 10 MG tablet Take 1 tablet (10 mg total) by mouth Martin.   famotidine (PEPCID) 20 MG tablet Take 1 tablet (20 mg total) by mouth 2 (two) times Martin.   ferrous sulfate 325 (65 FE) MG tablet Take 1 tablet (325 mg total) by mouth every other day.   furosemide (LASIX) 40 MG tablet Take 0.5 tablets (20 mg total) by mouth 2 (two) times Martin as needed for edema or fluid.   Magnesium 500 MG TABS Take 1 tablet by mouth every other day.   metoprolol tartrate (LOPRESSOR) 25 MG tablet Take 25 mg by mouth 2 (two) times Martin.   Multiple Vitamins-Minerals (CENTRUM MINIS ADULTS 50+) TABS Take 1 tablet by mouth Martin in the afternoon.   pantoprazole (PROTONIX) 40 MG tablet Take 1 tablet (40 mg total) by mouth Martin.   triamcinolone cream (KENALOG) 0.1 % Apply 1 Application topically 2 (two) times Martin as needed (for atopic dermatitis- affected areas).     Allergies:   Clonidine derivatives, Hydralazine, Atorvastatin, Hydrochlorothiazide, Losartan, Micardis hct [telmisartan-hctz], Pravastatin, Statins, Telmisartan, Valsartan-hydrochlorothiazide, and Ibuprofen   Social History   Socioeconomic History   Marital status: Divorced    Spouse name: Not on file    Number of children: Not on file   Years of education: Not on file   Highest education level: Not on file  Occupational History   Not on file  Tobacco Use   Smoking status: Never   Smokeless tobacco: Never  Vaping Use   Vaping status: Never Used  Substance and Sexual Activity   Alcohol use: No   Drug use: No   Sexual activity: Never  Other Topics Concern   Not on file  Social History Narrative   Not on file   Social Determinants of Health   Financial Resource Strain: Not on file  Food Insecurity: No Food Insecurity (06/11/2023)   Hunger Vital Sign    Worried About Running Out of Food in the Last Year: Never true    Ran Out of Food in the Last Year: Never true  Transportation Needs: No Transportation Needs (06/11/2023)   PRAPARE - Transportation    Lack of Transportation (Medical): No    Lack  of Transportation (Non-Medical): No  Physical Activity: Insufficiently Active (02/02/2023)   Exercise Vital Sign    Days of Exercise per Week: 5 days    Minutes of Exercise per Session: 20 min  Stress: Not on file  Social Connections: Not on file     Family History: The patient's family history includes Asthma in her mother; COPD in her mother; Cancer in her maternal grandmother; Hypertension in her father; Stroke in her father.  ROS:   Please see the history of present illness.    All other systems reviewed and are negative.  EKGs/Labs/Other Studies Reviewed:    The following studies were reviewed today: I discussed my findings with the patient at length     Recent Labs: 06/02/2023: BNP 101.0 06/13/2023: Magnesium 2.3 07/26/2023: Hemoglobin 9.2; Platelets 241 08/24/2023: BUN 49; Creatinine, Ser 1.43; Potassium 4.2; Sodium 140  Recent Lipid Panel No results found for: "CHOL", "TRIG", "HDL", "CHOLHDL", "VLDL", "LDLCALC", "LDLDIRECT"  Physical Exam:    VS:  BP (!) 162/76   Pulse 86   Ht 5\' 5"  (1.651 m)   Wt 258 lb 0.6 oz (117 kg)   SpO2 92%   BMI 42.94 kg/m     Wt  Readings from Last 3 Encounters:  10/26/23 258 lb 0.6 oz (117 kg)  10/06/23 263 lb (119.3 kg)  09/23/23 263 lb 2 oz (119.4 kg)     GEN: Patient is in no acute distress HEENT: Normal NECK: No JVD; No carotid bruits LYMPHATICS: No lymphadenopathy CARDIAC: Hear sounds regular, 2/6 systolic murmur at the apex. RESPIRATORY:  Clear to auscultation without rales, wheezing or rhonchi  ABDOMEN: Soft, non-tender, non-distended MUSCULOSKELETAL:  No edema; No deformity  SKIN: Warm and dry NEUROLOGIC:  Alert and oriented x 3 PSYCHIATRIC:  Normal affect   Signed, Garwin Brothers, MD  10/26/2023 3:58 PM    [facility]

## 2023-10-26 NOTE — Patient Instructions (Signed)

## 2023-11-03 ENCOUNTER — Encounter: Payer: Self-pay | Admitting: Cardiology

## 2023-11-03 ENCOUNTER — Encounter: Payer: Self-pay | Admitting: Family Medicine

## 2023-11-03 ENCOUNTER — Encounter (HOSPITAL_BASED_OUTPATIENT_CLINIC_OR_DEPARTMENT_OTHER): Payer: Self-pay | Admitting: Family

## 2023-11-03 ENCOUNTER — Ambulatory Visit (HOSPITAL_BASED_OUTPATIENT_CLINIC_OR_DEPARTMENT_OTHER): Payer: Medicare Other | Admitting: Family

## 2023-11-03 VITALS — BP 172/76 | HR 73 | Ht 65.0 in | Wt 263.1 lb

## 2023-11-03 DIAGNOSIS — E782 Mixed hyperlipidemia: Secondary | ICD-10-CM

## 2023-11-03 DIAGNOSIS — I5032 Chronic diastolic (congestive) heart failure: Secondary | ICD-10-CM

## 2023-11-03 DIAGNOSIS — I1A0 Resistant hypertension: Secondary | ICD-10-CM

## 2023-11-03 MED ORDER — METOPROLOL TARTRATE 25 MG PO TABS: 37.5000 mg | ORAL_TABLET | Freq: Two times a day (BID) | ORAL | 1 refills | Status: DC

## 2023-11-03 NOTE — Progress Notes (Signed)
Advanced Hypertension Clinic Assessment:    Date:  11/03/2023   ID:  Jenny Martin, DOB 1946-05-22, MRN [id number]  PCP:  Clayborne Dana, NP  Cardiologist:  Chilton Si, MD  Nephrologist:  Referring MD: Clayborne Dana, NP   CC: Hypertension  History of Present Illness:    Jenny Martin is a 77 y.o. female with a hx of  hypertension, hyperlipidemia, anemia, GERD, asthma, pneumonia, carpal tunnel syndrome, obesity, HFpEF, edema.  Presents today for follow-up with the Advanced Hypertension clinic.   Established with advanced hypertension clinic 02/02/2023.  She had multiple ED visits earlier in the year with elevated blood pressure. He saw Dr. Tomie China 12/07/2022 with BP 180/73.   It was noted she had intolerance to multiple antihypertensives.  Clonidine was initiated. Renal artery duplex 11/2022 no renal artery stenosis, cyst noted in the right upper pole.  Echo 12/2022 LVEF 60 to 65%, mild LVH, grade 1 diastolic dysfunction, mild MR.  Initial hypertension clinic blood pressure noted be asymmetric but inconsistent which arm was higher and not consistent with coarctation.  She was recommended continue present dose amlodipine and start spironolactone daily in the morning.  She was enrolled in [facility]ify RPM . It was felt some of her symptoms in various ED visits may have psychosomatic component.   Has since noted intolerance to Spironoalctone, Doxazosin, Amlodipine. She has declined sleep study.   08/17/23 with Dr. Duke Salvia Chlorthalidone increased to 37.5mg  daily. BMP one week later revealed stable renal function. Saw Dr. Tomie China 10/26/23 reported BP better at home, medication changes deferred.  Reports feeling very well. No chest pain. Occasional dyspnea which she attributes to asthma which improves with inhaler. For exercise she has been using pedals at home while watching television intermittently. Eating at home and out. She is careful to limit salt. Blood pressure at home has been 140.  She attributes her high blood pressure to stress from driving to clinic and being out of shape walking into clinic. She has been taking 1.5 of her Metoprolol as well as 1.5 tablets of her Chlorthalidone with improvement in blood pressure.   Previous antihypertensives Beta-blockers (bisoprolol, carvedilol)-wheezing ACE-swelling, hyponatremia Lasix-hyponatremia (otherwise tolerated) Hydrochlorothiazide-hyponatremia ARB (Telmisartan-rash), Losartan, Valsartan, Irbesartan Clonidine-neck rash, dry mouth Spironolactone - swelling Amlodipine Clonidine  Past Medical History:  Diagnosis Date   Abnormal TSH 12/16/2015   Allergic rhinitis 12/10/2015   Anemia    Arachnoid cyst    Arcus senilis of both eyes 12/10/2015   Atopic dermatitis    Blood transfusion without reported diagnosis    Cardiac murmur 12/07/2022   Carpal tunnel syndrome    Chest pain 06/13/2023   Chronic heart failure with preserved ejection fraction (HFpEF) (HCC) 06/12/2023   Chronic vulvitis    CKD stage 3b, 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   Cortical age-related cataract of both eyes 04/27/2018   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   Edema 12/10/2015   Gastroesophageal reflux disease without esophagitis 06/08/2022   H/O bladder infections    History of chicken pox    Hypercholesterolemia    Hyperlipidemia 12/10/2015   Hyperopia with astigmatism and presbyopia, bilateral 12/10/2015   Hypertension    Hypo-osmolality and hyponatremia 01/08/2022   Hypomagnesemia 01/03/2022   Hyponatremia 01/02/2022   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 04/27/2018   Medication intolerance 06/13/2023   Mild intermittent asthma 12/16/2015   Morbid obesity (HCC) 12/07/2022   Need for prophylactic vaccination with Streptococcus pneumoniae (Pneumococcus) and Influenza vaccines 12/16/2015   Normocytic  anemia 01/03/2022   Nuclear sclerotic cataract of both eyes 04/25/2017   Obese    Palpitations  06/11/2023   Renal cyst 01/07/2023   Right renal hypoechoic mass (1.3 cm x 1.3 cm), most likely representing simple cyst noted on Vascular US Renal Artery Duplex on 12/15/22. CT Abdomen/Pelvis from 01/02/22 showed small right renal midpole low density cyst, appearing benign.      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   Uncontrolled hypertension 12/07/2022   Vitreous floaters, bilateral 04/25/2017    Past Surgical History:  Procedure Laterality Date   COLPOSCOPY  12/2008   normal   WRIST SURGERY      Current Medications: Current Meds  Medication Sig   acetaminophen (TYLENOL) 325 MG tablet Take 650 mg by mouth every 6 (six) hours as needed for fever, headache, moderate pain or mild pain.   albuterol (VENTOLIN HFA) 108 (90 Base) MCG/ACT inhaler Inhale 2 puffs into the lungs every 6 (six) hours as needed for shortness of breath or wheezing.   aspirin 325 MG tablet Take 325 mg by mouth every 6 (six) hours as needed for mild pain or moderate pain.   chlorthalidone (HYGROTON) 25 MG tablet TAKE 1 AND 1/2 TABLETS DAILY   ezetimibe (ZETIA) 10 MG tablet Take 1 tablet (10 mg total) by mouth daily.   ferrous sulfate 325 (65 FE) MG tablet Take 1 tablet (325 mg total) by mouth every other day.   furosemide (LASIX) 40 MG tablet Take 0.5 tablets (20 mg total) by mouth 2 (two) times daily as needed for edema or fluid.   Magnesium 500 MG TABS Take 1 tablet by mouth every other day.   Multiple Vitamins-Minerals (CENTRUM MINIS ADULTS 50+) TABS Take 1 tablet by mouth daily in the afternoon.   pantoprazole (PROTONIX) 40 MG tablet Take 1 tablet (40 mg total) by mouth daily.   triamcinolone cream (KENALOG) 0.1 % Apply 1 Application topically 2 (two) times daily as needed (for atopic dermatitis- affected areas).   VITAMIN D, ERGOCALCIFEROL, PO Take by mouth.   [DISCONTINUED] metoprolol tartrate (LOPRESSOR) 25 MG tablet Take 25 mg by mouth 2 (two) times daily.      Allergies:   Clonidine derivatives, Hydralazine, Atorvastatin, Hydrochlorothiazide, Losartan, Micardis hct [telmisartan-hctz], Pravastatin, Statins, Telmisartan, Valsartan-hydrochlorothiazide, and Ibuprofen   Social History   Socioeconomic History   Marital status: Divorced    Spouse name: Not on file   Number of children: Not on file   Years of education: Not on file   Highest education level: Not on file  Occupational History   Not on file  Tobacco Use   Smoking status: Never   Smokeless tobacco: Never  Vaping Use   Vaping status: Never Used  Substance and Sexual Activity   Alcohol use: No   Drug use: No   Sexual activity: Never  Other Topics Concern   Not on file  Social History Narrative   Not on file   Social Drivers of Health   Financial Resource Strain: Not on file  Food Insecurity: No Food Insecurity (06/11/2023)   Hunger Vital Sign    Worried About Running Out of Food in the Last Year: Never true    Ran Out of Food in the Last Year: Never true  Transportation Needs: No Transportation Needs (06/11/2023)   PRAPARE - Administrator, Civil Service (Medical): No    Lack of Transportation (Non-Medical): No  Physical Activity: Insufficiently Active (02/02/2023)  Exercise Vital Sign    Days of Exercise per Week: 5 days    Minutes of Exercise per Session: 20 min  Stress: Not on file  Social Connections: Not on file     Family History: The patient's family history includes Asthma in her mother; COPD in her mother; Cancer in her maternal grandmother; Hypertension in her father; Stroke in her father.  ROS:   Please see the history of present illness.       All other systems reviewed and are negative.  EKGs/Labs/Other Studies Reviewed:    EKG:  EKG is not ordered today.  Recent Labs: 06/02/2023: BNP 101.0 06/13/2023: Magnesium 2.3 07/26/2023: Hemoglobin 9.2; Platelets 241 08/24/2023: BUN 49; Creatinine, Ser 1.43; Potassium 4.2; Sodium 140   Recent  Lipid Panel No results found for: "CHOL", "TRIG", "HDL", "CHOLHDL", "VLDL", "LDLCALC", "LDLDIRECT"  Physical Exam:   VS:  BP (!) 172/76   Pulse 73   Ht 5\' 5"  (1.651 m)   Wt 263 lb 1.6 oz (119.3 kg)   SpO2 98%   BMI 43.78 kg/m  , BMI Body mass index is 43.78 kg/m. GENERAL:  Well appearing HEENT: Pupils equal round and reactive, fundi not visualized, oral mucosa unremarkable NECK:  No jugular venous distention, waveform within normal limits, carotid upstroke brisk and symmetric, no bruits, no thyromegaly LYMPHATICS:  No cervical adenopathy LUNGS:  Clear to auscultation bilaterally HEART:  RRR.  PMI not displaced or sustained,S1 and S2 within normal limits, no S3, no S4, no clicks, no rubs, no murmurs ABD:  Flat, positive bowel sounds normal in frequency in pitch, no bruits, no rebound, no guarding, no midline pulsatile mass, no hepatomegaly, no splenomegaly EXT:  2 plus pulses throughout, no cyanosis no clubbing, bilateral LE with 2+ pitting edema (lymphedema like appearance) SKIN:  No rashes no nodules NEURO:  Cranial nerves II through XII grossly intact, motor grossly intact throughout PSYCH:  Cognitively intact, oriented to person place and time. Anxious.    ASSESSMENT/PLAN:    Hypertension-BP not at goal less than 130/80 in clinic but reports often 130s at home as well as some readings 120s or 140s.  Multiple prior medication intolerances detailed above.  She is very hesitant regarding medications.  Notes she has been taking 1.5 tablets of her metoprolol with improvement in BP, continues to have a provide updated refill.  Continue chlorthalidone 37.5mg  daily. Discussed to monitor BP at home at least 2 hours after medications and sitting for 5-10 minutes.  Snores / Sleep disordered breathing -previously declined sleep study.  HFpEF/murmur - Euvolemic and well compensated on exam. Management per primary cardiology team.     Screening for Secondary Hypertension:     02/02/2023     9:45 AM  Causes  Drugs/Herbals Screened     - Comments No EtOH.  No NSAIDs.  Tries to limit sodium.  1 coffee daily  Renovascular HTN Screened     - Comments Normal 11/2022  Sleep Apnea N/A     - Comments no snoring  Thyroid Disease Screened     - Comments normal 07/2022  Hyperaldosteronism N/A  Pheochromocytoma N/A  Cushing's Syndrome N/A  Hyperparathyroidism N/A  Coarctation of the Aorta Screened  Compliance Screened     - Comments BP higher R>L    Relevant Labs/Studies:    Latest Ref Rng & Units 08/24/2023   10:25 AM 07/26/2023   11:41 AM 06/13/2023    1:02 AM  Basic Labs  Sodium 134 - 144 mmol/L 140  139  131   Potassium 3.5 - 5.2 mmol/L 4.2  4.3  4.3   Creatinine 0.57 - 1.00 mg/dL 0.98  1.19  1.47                    12/15/2022    9:41 AM  Renovascular   Renal Artery [place] Completed Yes     Disposition:    FU with MD/PharmD/APP in 2-3 weeks   Medication Adjustments/Labs and Tests Ordered: Current medicines are reviewed at length with the patient today.  Concerns regarding medicines are outlined above.  No orders of the defined types were placed in this encounter.  Meds ordered this encounter  Medications   metoprolol tartrate (LOPRESSOR) 25 MG tablet    Sig: Take 1.5 tablets (37.5 mg total) by mouth 2 (two) times daily.    Dispense:  270 tablet    Refill:  1    Supervising Provider:   Jodelle Red [[id number]]     Signed, Alver Sorrow, NP  11/03/2023 10:52 AM    [facility]

## 2023-11-03 NOTE — Patient Instructions (Signed)
Medication Instructions:  Your physician recommends that you continue on your current medications as directed. Please refer to the Current Medication list given to you today.  *If you need a refill on your cardiac medications before your next appointment, please call your pharmacy*   Follow-Up: At [facility], you and your health needs are our priority.  As part of our continuing mission to provide you with exceptional heart care, we have created designated Provider Care Teams.  These Care Teams include your primary Cardiologist (physician) and Advanced Practice Providers (APPs -  Physician Assistants and Nurse Practitioners) who all work together to provide you with the care you need, when you need it.  We recommend signing up for the patient portal called "MyChart".  Sign up information is provided on this After Visit Summary.  MyChart is used to connect with patients for Virtual Visits (Telemedicine).  Patients are able to view lab/test results, encounter notes, upcoming appointments, etc.  Non-urgent messages can be sent to your provider as well.   To learn more about what you can do with MyChart, go to [email or web address].    Your next appointment:   6 month(s) in Hypertension Clinic  Provider:   Chilton Si, MD or Gillian Shields, NP

## 2023-11-04 MED ORDER — METOPROLOL SUCCINATE ER 25 MG PO TB24: 37.5000 mg | ORAL_TABLET | Freq: Two times a day (BID) | ORAL | 1 refills | Status: DC

## 2023-12-19 ENCOUNTER — Other Ambulatory Visit (HOSPITAL_BASED_OUTPATIENT_CLINIC_OR_DEPARTMENT_OTHER): Payer: Self-pay | Admitting: Family

## 2023-12-19 DIAGNOSIS — R0602 Shortness of breath: Secondary | ICD-10-CM

## 2023-12-19 DIAGNOSIS — I1A0 Resistant hypertension: Secondary | ICD-10-CM

## 2024-01-16 ENCOUNTER — Encounter (HOSPITAL_BASED_OUTPATIENT_CLINIC_OR_DEPARTMENT_OTHER): Payer: Self-pay | Admitting: Cardiovascular Disease

## 2024-01-17 ENCOUNTER — Ambulatory Visit (HOSPITAL_BASED_OUTPATIENT_CLINIC_OR_DEPARTMENT_OTHER)

## 2024-01-17 ENCOUNTER — Encounter (HOSPITAL_BASED_OUTPATIENT_CLINIC_OR_DEPARTMENT_OTHER): Payer: Self-pay | Admitting: Pharmacist Clinician (PhC)/ Clinical Pharmacy Specialist

## 2024-01-17 VITALS — BP 205/82 | HR 60 | Ht 65.0 in | Wt 261.6 lb

## 2024-01-17 DIAGNOSIS — I10 Essential (primary) hypertension: Secondary | ICD-10-CM | POA: Diagnosis not present

## 2024-01-17 MED ORDER — METOPROLOL SUCCINATE ER 25 MG PO TB24: 37.5000 mg | ORAL_TABLET | Freq: Every day | ORAL | 3 refills | Status: DC

## 2024-01-17 MED ORDER — CHLORTHALIDONE 25 MG PO TABS: 25.0000 mg | ORAL_TABLET | Freq: Every day | ORAL | 3 refills | Status: DC

## 2024-01-17 NOTE — Assessment & Plan Note (Signed)
 Assessment: BP is uncontrolled in office BP 205/82 mmHg;  above the goal (<130/80). Suspect much of elevated reading in office related to anxiety  Complains of multiple side effects, but has been on meds, at these doses for several months.  Reiterated the importance of regular exercise and low salt diet   Plan:  Decrease chlorthalidone to 25 mg once daily Decrease metoprolol succ to 37.5 mg once daily Patient to keep record of BP readings with heart rate and report to [place] at the next visit - bring home cuff as well for calibration Reviewed possible need for low doses of multiple medications to achieve BP goals, as she does not seem to tolerate therapeutic doses Encouraged pt to stop reading about her medications on the internet, as most of it is wrong and designed to scare patients.   Patient to follow up with PharmD in 1 month  Labs ordered today:  none

## 2024-01-17 NOTE — Patient Instructions (Addendum)
 Follow up appointment: Monday April 14 at 11:30 am  Take your BP meds as follows:  Take chlorthalidone 25 mg (1 tablet) once daily in the mornings  Take metoprolol succ 37.5 mg (1.5 tablets) once daily in the evenings ONLY   Take ezetimibe for cholesterol - 1/2 tablet daily Take lasix 1/2 tablet daily for 3 days in a row for swelling.   Colace (docusate calcium or sodium) for constipation  Check your blood pressure at home daily (if able) and keep record of the readings.  Your blood pressure goal is <160/80  To check your pressure at home you will need to:  1. Sit up in a chair, with feet flat on the floor and back supported. Do not cross your ankles or legs. 2. Rest your left arm so that the cuff is about heart level. If the cuff goes on your upper arm,  then just relax the arm on the table, arm of the chair or your lap. If you have a wrist cuff, we  suggest relaxing your wrist against your chest (think of it as Pledging the Flag with the  wrong arm).  3. Place the cuff snugly around your arm, about 1 inch above the crook of your elbow. The  cords should be inside the groove of your elbow.  4. Sit quietly, with the cuff in place, for about 5 minutes. After that 5 minutes press the power  button to start a reading. 5. Do not talk or move while the reading is taking place.  6. Record your readings on a sheet of paper. Although most cuffs have a memory, it is often  easier to see a pattern developing when the numbers are all in front of you.  7. You can repeat the reading after 1-3 minutes if it is recommended  Make sure your bladder is empty and you have not had caffeine or tobacco within the last 30 min  Always bring your blood pressure log with you to your appointments. If you have not brought your monitor in to be double checked for accuracy, please bring it to your next appointment.  You can find a list of quality blood pressure cuffs at [email or web address]  Important lifestyle  changes to control high blood pressure  Intervention  Effect on the BP  Lose extra pounds and watch your waistline Weight loss is one of the most effective lifestyle changes for controlling blood pressure. If you're overweight or obese, losing even a small amount of weight can help reduce blood pressure. Blood pressure might go down by about 1 millimeter of mercury (mm Hg) with each kilogram (about 2.2 pounds) of weight lost.  Exercise regularly As a general goal, aim for at least 30 minutes of moderate physical activity every day. Regular physical activity can lower high blood pressure by about 5 to 8 mm Hg.  Eat a healthy diet Eating a diet rich in whole grains, fruits, vegetables, and low-fat dairy products and low in saturated fat and cholesterol. A healthy diet can lower high blood pressure by up to 11 mm Hg.  Reduce salt (sodium) in your diet Even a small reduction of sodium in the diet can improve heart health and reduce high blood pressure by about 5 to 6 mm Hg.  Limit alcohol One drink equals 12 ounces of beer, 5 ounces of wine, or 1.5 ounces of 80-proof liquor.  Limiting alcohol to less than one drink a day for women or two drinks a day for men can  help lower blood pressure by about 4 mm Hg.   If you have any questions or concerns please use My Chart to send questions or call the office at [phone]

## 2024-01-17 NOTE — Progress Notes (Signed)
 Office Visit    Patient Name: Jenny Martin Date of Encounter: 01/17/2024  Primary Care Provider:  Clayborne Dana, NP Primary Cardiologist:  Chilton Si, MD  Chief Complaint    Hypertension - Advanced hypertension clinic  Past Medical History   HLD 9/23 non HDL 136  HFpEF Grade 1 diastolic dysfunction; some edema, has prn furosemide  GERD No meds  asthma Prn albuterol    Allergies  Allergen Reactions   Clonidine Derivatives Shortness Of Breath   Hydralazine Shortness Of Breath   Atorvastatin Other (See Comments)    Myalgias   Hydrochlorothiazide Other (See Comments)    It drys me out.   Losartan Other (See Comments)    Headaches    Micardis Hct [Telmisartan-Hctz] Swelling and Other (See Comments)    Ankles and legs swell   Pravastatin Other (See Comments)    Myalgias   Statins Other (See Comments)    Cramping and muscle pain   Telmisartan Hives   Valsartan-Hydrochlorothiazide Other (See Comments)    Made the chest burn   Ibuprofen Hives and Rash    History of Present Illness    Jenny Martin is a 78 y.o. female patient who was referred to the [facility] and was most recently seen in December by Gillian Shields.  She has multiple medication intolerances, leaving few options for hypertension management.  She is hesitant to take medications, and when she saw [facility], there were no medication changes.  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    Today she returns for follow up.  States she has continued to take her medications, but lists multiple reasons why they causing problems.  Had a long discussion about separating side effects vs symptoms of natural aging, occasional AF spells and fluid build-up.  Explained that many of  her side effects are not things we would expect with medication (ie swelling with spironolactone).    Blood Pressure Goal:  130/80  Current Medications: chlorthalidone 37.5 mg every day, metoprolol succ 37.5 mg every day   Previously tried:  Beta-blockers (bisoprolol, carvedilol)-wheezing ACE-swelling, hyponatremia Lasix-hyponatremia (otherwise tolerated) Hydrochlorothiazide-hyponatremia ARB (Telmisartan-rash), Losartan, Valsartan (made chest burn), Irbesartan Clonidine-neck rash, dry mouth, SOB Hydralazine Spironolactone - swelling Amlodipine Clonidine doxazosin  Family Hx:  father had hypertension/stroke, not aware of other CVD in family   Social Hx:      Tobacco: no  Alcohol: no  Caffeine:  no   Exercise: uses pedals while sitting, has treadmill, but not used recently  Home BP readings:  states have been as high as 200 systolic  21 readings average 086/57  Accessory Clinical Findings    Lab Results  Component Value Date   CREATININE 1.43 (H) 08/24/2023   BUN 49 (H) 08/24/2023   NA 140 08/24/2023   K 4.2 08/24/2023   CL 101 08/24/2023   CO2 25 08/24/2023   Lab Results  Component Value Date   ALT 23 09/30/2022   AST 34 09/30/2022   ALKPHOS 48 09/30/2022   BILITOT 0.4 09/30/2022   No results found for: "HGBA1C"  Screening for Secondary Hypertension:      02/02/2023    9:45 AM  Causes  Drugs/Herbals Screened     - Comments No EtOH.  No NSAIDs.  Tries to limit sodium.  1  coffee daily  Renovascular HTN Screened     - Comments Normal 11/2022  Sleep Apnea N/A     - Comments no snoring  Thyroid Disease Screened     - Comments normal 07/2022  Hyperaldosteronism N/A  Pheochromocytoma N/A  Cushing's Syndrome N/A  Hyperparathyroidism N/A  Coarctation of the Aorta Screened  Compliance Screened     - Comments BP higher R>L    Relevant Labs/Studies:    Latest Ref Rng & Units 08/24/2023   10:25 AM 07/26/2023   11:41 AM 06/13/2023    1:02 AM  Basic Labs   Sodium 134 - 144 mmol/L 140  139  131   Potassium 3.5 - 5.2 mmol/L 4.2  4.3  4.3   Creatinine 0.57 - 1.00 mg/dL 7.82  9.56  2.13                    12/15/2022    9:41 AM  Renovascular   Renal Artery [place] Completed Yes      Home Medications    Current Outpatient Medications  Medication Sig Dispense Refill   chlorthalidone (HYGROTON) 25 MG tablet Take 1 tablet (25 mg total) by mouth daily. 90 tablet 3   metoprolol succinate (TOPROL XL) 25 MG 24 hr tablet Take 1.5 tablets (37.5 mg total) by mouth daily. 135 tablet 3   acetaminophen (TYLENOL) 325 MG tablet Take 650 mg by mouth every 6 (six) hours as needed for fever, headache, moderate pain or mild pain.     albuterol (VENTOLIN HFA) 108 (90 Base) MCG/ACT inhaler INHALE 2 PUFFS INTO THE LUNGS EVERY 6 HOURS AS NEEDED FOR SHORTNESS OF BREATH OR WHEEZING 6.7 g 1   aspirin 325 MG tablet Take 325 mg by mouth every 6 (six) hours as needed for mild pain or moderate pain.     ezetimibe (ZETIA) 10 MG tablet Take 1 tablet (10 mg total) by mouth daily. 90 tablet 3   ferrous sulfate 325 (65 FE) MG tablet Take 1 tablet (325 mg total) by mouth every other day.     furosemide (LASIX) 40 MG tablet Take 0.5 tablets (20 mg total) by mouth 2 (two) times daily as needed for edema or fluid. 30 tablet 3   Magnesium 500 MG TABS Take 1 tablet by mouth every other day.     Multiple Vitamins-Minerals (CENTRUM MINIS ADULTS 50+) TABS Take 1 tablet by mouth daily in the afternoon.     triamcinolone cream (KENALOG) 0.1 % Apply 1 Application topically 2 (two) times daily as needed (for atopic dermatitis- affected areas). 30 g    VITAMIN D, ERGOCALCIFEROL, PO Take by mouth.     No current facility-administered medications for this visit.     Assessment & Plan   HYPERTENSION CONTROL Vitals:   01/17/24 1129 01/17/24 1134  BP: (!) 213/91 (!) 205/82    The patient's blood pressure is elevated above target today.  In order to address the patient's elevated BP: A  current anti-hypertensive medication was adjusted today.; Blood pressure will be monitored at home to determine if medication changes need to be made.; The blood pressure is usually elevated in clinic.  Blood pressures monitored at home have been optimal.      Uncontrolled hypertension Assessment: BP is uncontrolled in office BP 205/82 mmHg;  above the goal (<130/80). Suspect much of elevated reading in office related to anxiety  Complains of multiple side effects, but has been on meds, at these doses for several months.  Reiterated  the importance of regular exercise and low salt diet   Plan:  Decrease chlorthalidone to 25 mg once daily Decrease metoprolol succ to 37.5 mg once daily Patient to keep record of BP readings with heart rate and report to [place] at the next visit - bring home cuff as well for calibration Reviewed possible need for low doses of multiple medications to achieve BP goals, as she does not seem to tolerate therapeutic doses Encouraged pt to stop reading about her medications on the internet, as most of it is wrong and designed to scare patients.   Patient to follow up with PharmD in 1 month  Labs ordered today:  none   Phillips Hay PharmD CPP [facility]  [address] [place], [place] [address] [phone]

## 2024-02-23 ENCOUNTER — Other Ambulatory Visit: Payer: Self-pay

## 2024-02-23 ENCOUNTER — Encounter (HOSPITAL_BASED_OUTPATIENT_CLINIC_OR_DEPARTMENT_OTHER): Payer: Self-pay | Admitting: Emergency Medicine

## 2024-02-23 ENCOUNTER — Emergency Department (HOSPITAL_BASED_OUTPATIENT_CLINIC_OR_DEPARTMENT_OTHER)

## 2024-02-23 ENCOUNTER — Emergency Department (HOSPITAL_BASED_OUTPATIENT_CLINIC_OR_DEPARTMENT_OTHER)
Admission: EM | Admit: 2024-02-23 | Discharge: 2024-02-23 | Disposition: A | Discharge status: Home / Self Care | Attending: Emergency Medicine | Admitting: Emergency Medicine

## 2024-02-23 DIAGNOSIS — R0602 Shortness of breath: Secondary | ICD-10-CM | POA: Diagnosis present

## 2024-02-23 DIAGNOSIS — N189 Chronic kidney disease, unspecified: Secondary | ICD-10-CM | POA: Diagnosis not present

## 2024-02-23 DIAGNOSIS — J441 Chronic obstructive pulmonary disease with (acute) exacerbation: Secondary | ICD-10-CM | POA: Insufficient documentation

## 2024-02-23 DIAGNOSIS — I129 Hypertensive chronic kidney disease with stage 1 through stage 4 chronic kidney disease, or unspecified chronic kidney disease: Secondary | ICD-10-CM | POA: Diagnosis not present

## 2024-02-23 LAB — CBC WITH DIFFERENTIAL/PLATELET
Abs Immature Granulocytes: 0 10*3/uL (ref 0.00–0.07)
Basophils Absolute: 0 10*3/uL (ref 0.0–0.1)
Basophils Relative: 1 %
Eosinophils Absolute: 0.4 10*3/uL (ref 0.0–0.5)
Eosinophils Relative: 10 %
HCT: 31.4 % — ABNORMAL LOW (ref 36.0–46.0)
Hemoglobin: 10 g/dL — ABNORMAL LOW (ref 12.0–15.0)
Immature Granulocytes: 0 %
Lymphocytes Relative: 13 %
Lymphs Abs: 0.5 10*3/uL — ABNORMAL LOW (ref 0.7–4.0)
MCH: 29.5 pg (ref 26.0–34.0)
MCHC: 31.8 g/dL (ref 30.0–36.0)
MCV: 92.6 fL (ref 80.0–100.0)
Monocytes Absolute: 0.5 10*3/uL (ref 0.1–1.0)
Monocytes Relative: 13 %
Neutro Abs: 2.6 10*3/uL (ref 1.7–7.7)
Neutrophils Relative %: 63 %
Platelets: 211 10*3/uL (ref 150–400)
RBC: 3.39 MIL/uL — ABNORMAL LOW (ref 3.87–5.11)
RDW: 13.5 % (ref 11.5–15.5)
WBC: 4.1 10*3/uL (ref 4.0–10.5)
nRBC: 0 % (ref 0.0–0.2)

## 2024-02-23 LAB — BASIC METABOLIC PANEL WITH GFR
Anion gap: 8 (ref 5–15)
BUN: 52 mg/dL — ABNORMAL HIGH (ref 8–23)
CO2: 28 mmol/L (ref 22–32)
Calcium: 9.1 mg/dL (ref 8.9–10.3)
Chloride: 103 mmol/L (ref 98–111)
Creatinine, Ser: 1.59 mg/dL — ABNORMAL HIGH (ref 0.44–1.00)
GFR, Estimated: 33 mL/min — ABNORMAL LOW (ref >60)
Glucose, Bld: 101 mg/dL — ABNORMAL HIGH (ref 70–99)
Potassium: 4.3 mmol/L (ref 3.5–5.1)
Sodium: 139 mmol/L (ref 135–145)

## 2024-02-23 LAB — TROPONIN I (HIGH SENSITIVITY)
Troponin I (High Sensitivity): 11 ng/L (ref <18)
Troponin I (High Sensitivity): 11 ng/L (ref <18)

## 2024-02-23 MED ORDER — IPRATROPIUM-ALBUTEROL 0.5-2.5 (3) MG/3ML IN SOLN
6.0000 mL | Freq: Once | RESPIRATORY_TRACT | Status: AC
  Administered 2024-02-23: 6 mL via RESPIRATORY_TRACT
  Filled 2024-02-23: qty 6

## 2024-02-23 MED ORDER — AZITHROMYCIN 250 MG PO TABS: 250.0000 mg | ORAL_TABLET | Freq: Every day | ORAL | 0 refills | Status: DC

## 2024-02-23 MED ORDER — METHYLPREDNISOLONE SODIUM SUCC 125 MG IJ SOLR
125.0000 mg | Freq: Once | INTRAMUSCULAR | Status: AC
  Administered 2024-02-23: 125 mg via INTRAVENOUS
  Filled 2024-02-23: qty 2

## 2024-02-23 MED ORDER — PREDNISONE 20 MG PO TABS: 20.0000 mg | ORAL_TABLET | Freq: Every day | ORAL | 0 refills | Status: AC

## 2024-02-23 NOTE — ED Notes (Signed)
 BLUE Top sent to lab to hold

## 2024-02-23 NOTE — ED Provider Notes (Signed)
 [place] [facility] Provider Note   CSN: [id number] Arrival date & time: 02/23/24  1621     History Chief Complaint  Patient presents with   Shortness of Breath    HPI Jenny Martin is a 78 y.o. female presenting for fever cough congestion with SOB x 2 weeks. Endorses thicker secretions. Using inhaler frequently without relief HX of CKD/HTN/GERD/Neuropenia/Edema  Patient's recorded medical, surgical, social, medication list and allergies were reviewed in the Snapshot window as part of the initial history.   Review of Systems   Review of Systems  Constitutional:  Negative for chills and fever.  HENT:  Negative for ear pain and sore throat.   Eyes:  Negative for pain and visual disturbance.  Respiratory:  Positive for cough and shortness of breath.   Cardiovascular:  Negative for chest pain and palpitations.  Gastrointestinal:  Negative for abdominal pain and vomiting.  Genitourinary:  Negative for dysuria and hematuria.  Musculoskeletal:  Negative for arthralgias and back pain.  Skin:  Negative for color change and rash.  Neurological:  Negative for seizures and syncope.  All other systems reviewed and are negative.   Physical Exam Updated Vital Signs BP (!) 155/135   Pulse 63   Temp 97.7 F (36.5 C)   Resp 18   SpO2 97%  Physical Exam Vitals and nursing note reviewed.  Constitutional:      General: She is not in acute distress.    Appearance: She is well-developed.  HENT:     Head: Normocephalic and atraumatic.  Eyes:     Conjunctiva/sclera: Conjunctivae normal.  Cardiovascular:     Rate and Rhythm: Normal rate and regular rhythm.     Heart sounds: No murmur heard. Pulmonary:     Effort: Pulmonary effort is normal. No respiratory distress.     Breath sounds: Decreased breath sounds and wheezing present.  Abdominal:     General: There is no distension.     Palpations: Abdomen is soft.     Tenderness: There is no  abdominal tenderness. There is no right CVA tenderness or left CVA tenderness.  Musculoskeletal:        General: No swelling or tenderness. Normal range of motion.     Cervical back: Neck supple.  Skin:    General: Skin is warm and dry.  Neurological:     General: No focal deficit present.     Mental Status: She is alert and oriented to person, place, and time. Mental status is at baseline.     Cranial Nerves: No cranial nerve deficit.      ED Course/ Medical Decision Making/ A&P    Procedures Procedures   Medications Ordered in ED Medications  ipratropium-albuterol (DUONEB) 0.5-2.5 (3) MG/3ML nebulizer solution 6 mL (6 mLs Nebulization Given 02/23/24 2048)  methylPREDNISolone sodium succinate (SOLU-MEDROL) 125 mg/2 mL injection 125 mg (125 mg Intravenous Given 02/23/24 2047)    Medical Decision Making:   Jenny Martin is a 78 y.o. female who presented to the ED today with shortness of breath episode detailed above.    Patient placed on continuous vitals and telemetry monitoring while in ED which was reviewed periodically.  Complete initial physical exam performed, notably the patient  was hemodynamically stable no acute distress.  Diffuse wheezing appreciated on my exam..    Reviewed and confirmed nursing documentation for past medical history, family history, social history.    Initial Assessment:   With the patient's presentation of shortness  of breath and wheezing, most likely diagnosis is COPD versus asthma exacerbation versus atypical pneumonia. Other diagnoses were considered including (but not limited to) community-acquired pneumonia, ACS, PE. These are considered less likely due to history of present illness and physical exam findings.   This is most consistent with an acute life/limb threatening illness complicated by underlying chronic conditions.  Initial Plan:  Screening labs including CBC and Metabolic panel to evaluate for infectious or metabolic etiology of  disease.  CXR to evaluate for structural/infectious intrathoracic pathology.  EKG and serial troponin to evaluate for cardiac pathology. Objective evaluation as below reviewed   Initial Study Results:   Laboratory  All laboratory results reviewed without evidence of clinically relevant pathology.     EKG EKG was reviewed independently. Rate, rhythm, axis, intervals all examined and without medically relevant abnormality. ST segments without concerns for elevations.    Radiology:  All images reviewed independently. Agree with radiology report at this time.   DG Chest 2 View Result Date: 02/23/2024 CLINICAL DATA:  Short of breath EXAM: CHEST - 2 VIEW COMPARISON:  09/23/2023, 12/27/2022 FINDINGS: Chronic elevation of left diaphragm with atelectasis. Enlarged cardiomediastinal silhouette. No pleural effusion or pneumothorax. IMPRESSION: Chronic elevation of left diaphragm with atelectasis. Enlarged cardiomediastinal silhouette. Electronically Signed   By: Jasmine Pang M.D.   On: 02/23/2024 20:37       Reassessment and Plan:   Patient treated with bronchodilators and steroids while in the emergency room.  She did complete resolution of her symptoms.  She feels comfortable outpatient care management.  Will treat with azithromycin and prednisone due to duration of symptoms greater than 10 days and thick mucus production.  Recommended follow-up with her primary care provider which are scheduled for 48 hours.  Disposition:  I have considered need for hospitalization, however, considering all of the above, I believe this patient is stable for discharge at this time.  Patient/family educated about specific return precautions for given chief complaint and symptoms.  Patient/family educated about follow-up with PCP.     Patient/family expressed understanding of return precautions and need for follow-up. Patient spoken to regarding all imaging and laboratory results and appropriate follow up for  these results. All education provided in verbal form with additional information in written form. Time was allowed for answering of patient questions. Patient discharged.    Emergency Department Medication Summary:   Medications  ipratropium-albuterol (DUONEB) 0.5-2.5 (3) MG/3ML nebulizer solution 6 mL (6 mLs Nebulization Given 02/23/24 2048)  methylPREDNISolone sodium succinate (SOLU-MEDROL) 125 mg/2 mL injection 125 mg (125 mg Intravenous Given 02/23/24 2047)        Clinical Impression:  1. Shortness of breath   2. COPD exacerbation [facility])      Discharge   Final Clinical Impression(s) / ED Diagnoses Final diagnoses:  Shortness of breath  COPD exacerbation (HCC)    Rx / DC Orders ED Discharge Orders          Ordered    azithromycin (ZITHROMAX) 250 MG tablet  Daily        02/23/24 2026    predniSONE (DELTASONE) 20 MG tablet  Daily        02/23/24 2027              Glyn Ade, MD 02/23/24 2146

## 2024-02-23 NOTE — ED Triage Notes (Signed)
 C/o congestion and cough with increased SHOB. Used inhaler at home with no relief. Denies fever.

## 2024-02-24 ENCOUNTER — Encounter: Payer: Self-pay | Admitting: Family Medicine

## 2024-02-27 ENCOUNTER — Ambulatory Visit (INDEPENDENT_AMBULATORY_CARE_PROVIDER_SITE_OTHER): Admitting: Pharmacist Clinician (PhC)/ Clinical Pharmacy Specialist

## 2024-02-27 VITALS — BP 168/80 | HR 60

## 2024-02-27 DIAGNOSIS — I10 Essential (primary) hypertension: Secondary | ICD-10-CM

## 2024-02-27 MED ORDER — CHLORTHALIDONE 25 MG PO TABS: 37.5000 mg | ORAL_TABLET | Freq: Every day | ORAL | 3 refills | Status: DC

## 2024-02-27 MED ORDER — NIFEDIPINE ER OSMOTIC RELEASE 30 MG PO TB24: 30.0000 mg | ORAL_TABLET | Freq: Every day | ORAL | 6 refills | Status: DC

## 2024-02-27 NOTE — Patient Instructions (Signed)
 Follow up appointment: with Phillips Hay PharmD on Friday May 9 at 11 am  Take your BP meds as follows:  AM;  chlorthalidone 37.5 mg, metoprolol 25 mg   PM;  nifedipine xl 30 mg, metoprolol 25 mg  Check your blood pressure at home daily (if able) and keep record of the readings.  Your blood pressure goal is < 140/80  To check your pressure at home you will need to:  1. Sit up in a chair, with feet flat on the floor and back supported. Do not cross your ankles or legs. 2. Rest your left arm so that the cuff is about heart level. If the cuff goes on your upper arm,  then just relax the arm on the table, arm of the chair or your lap. If you have a wrist cuff, we  suggest relaxing your wrist against your chest (think of it as Pledging the Flag with the  wrong arm).  3. Place the cuff snugly around your arm, about 1 inch above the crook of your elbow. The  cords should be inside the groove of your elbow.  4. Sit quietly, with the cuff in place, for about 5 minutes. After that 5 minutes press the power  button to start a reading. 5. Do not talk or move while the reading is taking place.  6. Record your readings on a sheet of paper. Although most cuffs have a memory, it is often  easier to see a pattern developing when the numbers are all in front of you.  7. You can repeat the reading after 1-3 minutes if it is recommended  Make sure your bladder is empty and you have not had caffeine or tobacco within the last 30 min  Always bring your blood pressure log with you to your appointments. If you have not brought your monitor in to be double checked for accuracy, please bring it to your next appointment.  You can find a list of quality blood pressure cuffs at [email or web address]  Important lifestyle changes to control high blood pressure  Intervention  Effect on the BP  Lose extra pounds and watch your waistline Weight loss is one of the most effective lifestyle changes for controlling blood  pressure. If you're overweight or obese, losing even a small amount of weight can help reduce blood pressure. Blood pressure might go down by about 1 millimeter of mercury (mm Hg) with each kilogram (about 2.2 pounds) of weight lost.  Exercise regularly As a general goal, aim for at least 30 minutes of moderate physical activity every day. Regular physical activity can lower high blood pressure by about 5 to 8 mm Hg.  Eat a healthy diet Eating a diet rich in whole grains, fruits, vegetables, and low-fat dairy products and low in saturated fat and cholesterol. A healthy diet can lower high blood pressure by up to 11 mm Hg.  Reduce salt (sodium) in your diet Even a small reduction of sodium in the diet can improve heart health and reduce high blood pressure by about 5 to 6 mm Hg.  Limit alcohol One drink equals 12 ounces of beer, 5 ounces of wine, or 1.5 ounces of 80-proof liquor.  Limiting alcohol to less than one drink a day for women or two drinks a day for men can help lower blood pressure by about 4 mm Hg.   If you have any questions or concerns please use My Chart to send questions or call the office at [phone]

## 2024-02-27 NOTE — Progress Notes (Signed)
 Office Visit    Patient Name: Jenny Martin Date of Encounter: 02/29/2024  Primary Care Provider:  Clayborne Dana, NP Primary Cardiologist:  Chilton Si, MD  Chief Complaint    Hypertension - [facility] hypertension clinic  Past Medical History   HLD 9/23 non HDL 136  HFpEF Grade 1 diastolic dysfunction; some edema, has prn furosemide  GERD No meds  asthma Prn albuterol    Allergies  Allergen Reactions   Clonidine Derivatives Shortness Of Breath   Hydralazine Shortness Of Breath   Atorvastatin Other (See Comments)    Myalgias   Hydrochlorothiazide Other (See Comments)    It drys me out.   Losartan Other (See Comments)    Headaches    Micardis Hct [Telmisartan-Hctz] Swelling and Other (See Comments)    Ankles and legs swell   Pravastatin Other (See Comments)    Myalgias   Statins Other (See Comments)    Cramping and muscle pain   Telmisartan Hives   Valsartan-Hydrochlorothiazide Other (See Comments)    Made the chest burn   Ibuprofen Hives and Rash    History of Present Illness    BAYLEA MILBURN is a 78 y.o. female patient who was referred to the [facility] and was most recently seen in December by Gillian Shields.  She has multiple medication intolerances, leaving few options for hypertension management.  She is hesitant to take medications, and when she saw [facility], there were no medication changes.  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  At her last visit we had a long discussion about separating side effects vs symptoms of natural aging, occasional AF spells and fluid build-up.  Explained that many of her side effects are not things we would expect with medication (ie swelling with spironolactone).  Chlorthalidone was  decreased to 25 mg daily and metoprolol succ to 25 mg daily.  She was encouraged to stop reading about her medications on the internet.  Today she returns for follow up.  She is frustrated that her pressure does not come down despite taking her meds daily.  Had a long discussion about the effectiveness of BP medications, and that it commonly takes 3 or more to get to goal.  With her list of contraindicated meds, we are very limited and would need to have maximum doses to see benefit.  Notes that she restricts diet to 2300 mg sodium daily.  Our records show she has been taking 25 mg chlorthalidone and 37.5 mg metoprolol succ.  Patient reports taking 37.5 mg chlorthalidone daily and 25 mg metoprolol succ twice daily.   Blood Pressure Goal:  130/80  Current Medications: chlorthalidone 37.5 mg every day, metoprolol succ 25 mg bid.    Previously tried:  Beta-blockers (bisoprolol, carvedilol)-wheezing ACE-swelling, hyponatremia Lasix-hyponatremia (otherwise tolerated) Hydrochlorothiazide-hyponatremia ARB (Telmisartan-rash), Losartan, Valsartan (made chest burn), Irbesartan Clonidine-neck rash, dry mouth, SOB Hydralazine Spironolactone - swelling Amlodipine Clonidine doxazosin  Family Hx:  father had hypertension/stroke, not aware of other CVD in family   Social Hx:      Tobacco: no  Alcohol: no  Caffeine:  no   Exercise: uses pedals while sitting, has treadmill, but not used recently  Home BP readings:  no regular readings with her today, has a few on her phone, looks as though they are  averaging 170's systolic.    Accessory Clinical Findings    Lab Results  Component Value Date   CREATININE 1.59 (H) 02/23/2024   BUN 52 (H) 02/23/2024   NA 139 02/23/2024   K 4.3 02/23/2024   CL 103 02/23/2024   CO2 28 02/23/2024   Lab Results  Component Value Date   ALT 23 09/30/2022   AST 34 09/30/2022   ALKPHOS 48 09/30/2022   BILITOT 0.4 09/30/2022   No results found for:  "HGBA1C"  Screening for Secondary Hypertension:      02/02/2023    9:45 AM  Causes  Drugs/Herbals Screened     - Comments No EtOH.  No NSAIDs.  Tries to limit sodium.  1 coffee daily  Renovascular HTN Screened     - Comments Normal 11/2022  Sleep Apnea N/A     - Comments no snoring  Thyroid Disease Screened     - Comments normal 07/2022  Hyperaldosteronism N/A  Pheochromocytoma N/A  Cushing's Syndrome N/A  Hyperparathyroidism N/A  Coarctation of the Aorta Screened  Compliance Screened     - Comments BP higher R>L    Relevant Labs/Studies:    Latest Ref Rng & Units 02/23/2024    6:26 PM 08/24/2023   10:25 AM 07/26/2023   11:41 AM  Basic Labs  Sodium 135 - 145 mmol/L 139  140  139   Potassium 3.5 - 5.1 mmol/L 4.3  4.2  4.3   Creatinine 0.44 - 1.00 mg/dL 1.61  0.96  0.45                    12/15/2022    9:41 AM  Renovascular   Renal Artery [place] Completed Yes      Home Medications    Current Outpatient Medications  Medication Sig Dispense Refill   chlorthalidone (HYGROTON) 25 MG tablet Take 1.5 tablets (37.5 mg total) by mouth daily. 135 tablet 3   NIFEdipine (PROCARDIA-XL/NIFEDICAL-XL) 30 MG 24 hr tablet Take 1 tablet (30 mg total) by mouth daily. 30 tablet 6   acetaminophen (TYLENOL) 325 MG tablet Take 650 mg by mouth every 6 (six) hours as needed for fever, headache, moderate pain or mild pain.     albuterol (VENTOLIN HFA) 108 (90 Base) MCG/ACT inhaler INHALE 2 PUFFS INTO THE LUNGS EVERY 6 HOURS AS NEEDED FOR SHORTNESS OF BREATH OR WHEEZING 6.7 g 1   aspirin 325 MG tablet Take 325 mg by mouth every 6 (six) hours as needed for mild pain or moderate pain.     azithromycin (ZITHROMAX) 250 MG tablet Take 1 tablet (250 mg total) by mouth daily. Take first 2 tablets together, then 1 every day until finished. 6 tablet 0   ezetimibe (ZETIA) 10 MG tablet Take 1 tablet (10 mg total) by mouth daily. 90 tablet 3   ferrous sulfate 325 (65 FE) MG tablet Take 1 tablet (325 mg total)  by mouth every other day.     furosemide (LASIX) 40 MG tablet Take 0.5 tablets (20 mg total) by mouth 2 (two) times daily as needed for edema or fluid. 30 tablet 3   Magnesium 500 MG TABS Take 1 tablet by mouth every other day.     metoprolol succinate (TOPROL XL) 25 MG 24 hr tablet Take 1.5 tablets (37.5 mg total) by mouth daily. 135 tablet 3   Multiple Vitamins-Minerals (CENTRUM MINIS ADULTS 50+) TABS Take 1 tablet by mouth daily in the afternoon.     triamcinolone cream (KENALOG)  0.1 % Apply 1 Application topically 2 (two) times daily as needed (for atopic dermatitis- affected areas). 30 g    VITAMIN D, ERGOCALCIFEROL, PO Take by mouth.     No current facility-administered medications for this visit.     Assessment & Plan    Hypertension Assessment: BP is uncontrolled in office BP 168/80 mmHg;  above the goal  Have eliminated most BP medications - can only think to try nifedipine or minoxidil Tolerates chlorthalidone and metoprolol succ well without any side effects Denies SOB, palpitation, chest pain, headaches,or swelling Reiterated the importance of regular exercise and low salt diet   Plan:  Start taking nifedipine xl 30 mg once daily Continue taking chlorthalidone 37.5 mg daily and metoprolol succ 25 mg bid.  Patient to keep record of BP readings with heart rate and report to us  at the next visit Patient to follow up with Dr. Theodis Fiscal in 2 months  Labs ordered today:  none   Donivan Furry PharmD CPP [facility]  [address] [place], [place] [address] [phone]

## 2024-02-29 ENCOUNTER — Encounter (HOSPITAL_BASED_OUTPATIENT_CLINIC_OR_DEPARTMENT_OTHER): Payer: Self-pay | Admitting: Pharmacist Clinician (PhC)/ Clinical Pharmacy Specialist

## 2024-02-29 NOTE — Assessment & Plan Note (Signed)
 Assessment: BP is uncontrolled in office BP 168/80 mmHg;  above the goal  Have eliminated most BP medications - can only think to try nifedipine or minoxidil Tolerates chlorthalidone and metoprolol succ well without any side effects Denies SOB, palpitation, chest pain, headaches,or swelling Reiterated the importance of regular exercise and low salt diet   Plan:  Start taking nifedipine xl 30 mg once daily Continue taking chlorthalidone 37.5 mg daily and metoprolol succ 25 mg bid.  Patient to keep record of BP readings with heart rate and report to us  at the next visit Patient to follow up with Dr. Theodis Fiscal in 2 months  Labs ordered today:  none

## 2024-03-19 ENCOUNTER — Other Ambulatory Visit (HOSPITAL_BASED_OUTPATIENT_CLINIC_OR_DEPARTMENT_OTHER): Payer: Self-pay | Admitting: Family

## 2024-03-19 DIAGNOSIS — I1A0 Resistant hypertension: Secondary | ICD-10-CM

## 2024-03-19 DIAGNOSIS — R0602 Shortness of breath: Secondary | ICD-10-CM

## 2024-03-19 NOTE — Telephone Encounter (Signed)
 Please advise if Albuterol  prescription refill is appropriate.

## 2024-03-23 ENCOUNTER — Ambulatory Visit (HOSPITAL_BASED_OUTPATIENT_CLINIC_OR_DEPARTMENT_OTHER): Admitting: Pharmacist Clinician (PhC)/ Clinical Pharmacy Specialist

## 2024-03-23 ENCOUNTER — Other Ambulatory Visit (HOSPITAL_BASED_OUTPATIENT_CLINIC_OR_DEPARTMENT_OTHER): Payer: Self-pay

## 2024-03-23 ENCOUNTER — Encounter (HOSPITAL_BASED_OUTPATIENT_CLINIC_OR_DEPARTMENT_OTHER): Payer: Self-pay | Admitting: Pharmacist Clinician (PhC)/ Clinical Pharmacy Specialist

## 2024-03-23 VITALS — BP 152/90 | HR 68

## 2024-03-23 DIAGNOSIS — I10 Essential (primary) hypertension: Secondary | ICD-10-CM | POA: Diagnosis not present

## 2024-03-23 MED ORDER — BISOPROLOL FUMARATE 5 MG PO TABS
5.0000 mg | ORAL_TABLET | Freq: Every day | ORAL | 6 refills | Status: DC
  Filled 2024-03-23: qty 30, 30d supply, fill #0

## 2024-03-23 NOTE — Assessment & Plan Note (Signed)
 Assessment: BP is uncontrolled in office BP 152/90 mmHg;  above the goal (<130/80). Still having asthma symptoms despite selective beta blocker  Tolerates chlorthalidone  well without any side effects Denies palpitation, chest pain, headaches,or swelling Reiterated the importance of regular exercise and low salt diet   Plan:  Stop taking metoprolol .  Wait 1 week then start bisoprolol  5 mg daily (in hopes of decreasing asthmatic symptoms) Decrease daily sodium intake to around 1500 mg Continue taking chlorthalidone  37.5 mg daily Patient to keep record of BP readings with heart rate and report to us  at the next visit Patient to follow up with Dr. Theodis Fiscal in 2 months  Labs ordered today:  none

## 2024-03-23 NOTE — Patient Instructions (Signed)
 Follow up appointment: WITH DR. Abeytas ON JULY 8  Take your BP meds as follows:  STOP METOPROLOL .  WAIT 1 WEEK (BREATHING SHOULD IMPROVE IN THIS TIME), THEN START BISOPROLOL  5 MG ONCE DAILY.    CONTINUE WITH CHLORTHALIDONE  37.5 MG (1.5 TABLETS) ONCE DAILY  Check your blood pressure at home daily (if able) and keep record of the readings.  Your blood pressure goal is < 130/80  To check your pressure at home you will need to:  1. Sit up in a chair, with feet flat on the floor and back supported. Do not cross your ankles or legs. 2. Rest your left arm so that the cuff is about heart level. If the cuff goes on your upper arm,  then just relax the arm on the table, arm of the chair or your lap. If you have a wrist cuff, we  suggest relaxing your wrist against your chest (think of it as Pledging the Flag with the  wrong arm).  3. Place the cuff snugly around your arm, about 1 inch above the crook of your elbow. The  cords should be inside the groove of your elbow.  4. Sit quietly, with the cuff in place, for about 5 minutes. After that 5 minutes press the power  button to start a reading. 5. Do not talk or move while the reading is taking place.  6. Record your readings on a sheet of paper. Although most cuffs have a memory, it is often  easier to see a pattern developing when the numbers are all in front of you.  7. You can repeat the reading after 1-3 minutes if it is recommended  Make sure your bladder is empty and you have not had caffeine or tobacco within the last 30 min  Always bring your blood pressure log with you to your appointments. If you have not brought your monitor in to be double checked for accuracy, please bring it to your next appointment.  You can find a list of quality blood pressure cuffs at [email or web address]  Important lifestyle changes to control high blood pressure  Intervention  Effect on the BP  Lose extra pounds and watch your waistline Weight loss is one of  the most effective lifestyle changes for controlling blood pressure. If you're overweight or obese, losing even a small amount of weight can help reduce blood pressure. Blood pressure might go down by about 1 millimeter of mercury (mm Hg) with each kilogram (about 2.2 pounds) of weight lost.  Exercise regularly As a general goal, aim for at least 30 minutes of moderate physical activity every day. Regular physical activity can lower high blood pressure by about 5 to 8 mm Hg.  Eat a healthy diet Eating a diet rich in whole grains, fruits, vegetables, and low-fat dairy products and low in saturated fat and cholesterol. A healthy diet can lower high blood pressure by up to 11 mm Hg.  Reduce salt (sodium) in your diet Even a small reduction of sodium in the diet can improve heart health and reduce high blood pressure by about 5 to 6 mm Hg.  Limit alcohol One drink equals 12 ounces of beer, 5 ounces of wine, or 1.5 ounces of 80-proof liquor.  Limiting alcohol to less than one drink a day for women or two drinks a day for men can help lower blood pressure by about 4 mm Hg.   If you have any questions or concerns please use My Chart to send questions  or call the office at [phone]

## 2024-03-23 NOTE — Progress Notes (Signed)
 Office Visit    Patient Name: Jenny Martin Date of Encounter: 03/23/2024  Primary Care Provider:  Everlina Hock, NP Primary Cardiologist:  Maudine Sos, MD  Chief Complaint    Hypertension - Advanced hypertension clinic  Past Medical History   HLD 9/23 non HDL 136  HFpEF Grade 1 diastolic dysfunction; some edema, has prn furosemide   GERD No meds  asthma Prn albuterol     Allergies  Allergen Reactions   Clonidine  Derivatives Shortness Of Breath   Hydralazine  Shortness Of Breath   Atorvastatin Other (See Comments)    Myalgias   Hydrochlorothiazide Other (See Comments)    It drys me out.   Losartan Other (See Comments)    Headaches    Micardis  Hct [Telmisartan -Hctz] Swelling and Other (See Comments)    Ankles and legs swell   Pravastatin Other (See Comments)    Myalgias   Statins Other (See Comments)    Cramping and muscle pain   Telmisartan  Hives   Valsartan-Hydrochlorothiazide Other (See Comments)    Made the chest burn   Ibuprofen Hives and Rash    History of Present Illness    Jenny Martin is a 78 y.o. female patient who was referred to the [facility] and was most recently seen in December by Neomi Banks.  She has multiple medication intolerances, leaving few options for hypertension management.  She is hesitant to take medications, and when she saw [facility], there were no medication changes.  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  At her last visit we had a long discussion about separating side effects vs symptoms of natural aging, occasional AF spells and fluid build-up.  Explained that many of her side effects are not things we would expect with medication (ie swelling with spironolactone ).  Chlorthalidone  was  decreased to 25 mg daily and metoprolol  succ to 25 mg daily.  She was encouraged to stop reading about her medications on the internet.  At follow up she was frustrated with lack of BP improvement, and again we talked about her issues with so many medications.  She was finally willing to try nifedipine  xl 30 mg once daily.    Today she returns for follow up.  Unfortunately after 1 dose of nifedipine  she felt as though her lips were swelling and discontinued.  Also had nausea for day or two after taking dose. Continues to feel that asthma worsened by metoprolol .    Blood Pressure Goal:  130/80  Current Medications: chlorthalidone  37.5 mg every day, metoprolol  succ 25 mg bid, Previously tried:  Beta-blockers (bisoprolol , carvedilol )-wheezing ACE-swelling, hyponatremia Lasix -hyponatremia (otherwise tolerated) Hydrochlorothiazide-hyponatremia ARB (Telmisartan -rash), Losartan, Valsartan (made chest burn), Irbesartan  Clonidine -neck rash, dry mouth, SOB Hydralazine  Spironolactone  - swelling Amlodipine  Clonidine  Doxazosin  Nifedipine  - one tablet caused lip swelling and nausea  Family Hx:  father had hypertension/stroke, not aware of other CVD in family   Social Hx:      Tobacco: no  Alcohol: no  Caffeine:  no   Diet:  more home cooked, when eating out more sit down restaurant; eats hamburger at home, does add a little salt; some potato chips;  likes vegetables  Exercise: uses pedals while sitting, has treadmill, but not used recently  Home BP readings:  no regular readings with her today, has a few on  her phone, looks as though they are averaging 170's systolic.    Accessory Clinical Findings    Lab Results  Component Value Date   CREATININE 1.59 (H) 02/23/2024   BUN 52 (H) 02/23/2024   NA 139 02/23/2024   K 4.3 02/23/2024   CL 103 02/23/2024   CO2 28 02/23/2024   Lab Results  Component Value Date   ALT 23 09/30/2022   AST 34 09/30/2022   ALKPHOS 48 09/30/2022   BILITOT 0.4  09/30/2022   No results found for: "HGBA1C"  Screening for Secondary Hypertension:      02/02/2023    9:45 AM  Causes  Drugs/Herbals Screened     - Comments No EtOH.  No NSAIDs.  Tries to limit sodium.  1 coffee daily  Renovascular HTN Screened     - Comments Normal 11/2022  Sleep Apnea N/A     - Comments no snoring  Thyroid Disease Screened     - Comments normal 07/2022  Hyperaldosteronism N/A  Pheochromocytoma N/A  Cushing's Syndrome N/A  Hyperparathyroidism N/A  Coarctation of the Aorta Screened  Compliance Screened     - Comments BP higher R>L    Relevant Labs/Studies:    Latest Ref Rng & Units 02/23/2024    6:26 PM 08/24/2023   10:25 AM 07/26/2023   11:41 AM  Basic Labs  Sodium 135 - 145 mmol/L 139  140  139   Potassium 3.5 - 5.1 mmol/L 4.3  4.2  4.3   Creatinine 0.44 - 1.00 mg/dL 1.61  0.96  0.45                    12/15/2022    9:41 AM  Renovascular   Renal Artery US  Completed Yes      Home Medications    Current Outpatient Medications  Medication Sig Dispense Refill   acetaminophen  (TYLENOL ) 325 MG tablet Take 650 mg by mouth every 6 (six) hours as needed for fever, headache, moderate pain or mild pain.     albuterol  (VENTOLIN  HFA) 108 (90 Base) MCG/ACT inhaler Inhale 1-2 puffs into the lungs every 6 (six) hours as needed for wheezing or shortness of breath. Future refills per primary care provider. 6.7 g 0   aspirin  325 MG tablet Take 325 mg by mouth every 6 (six) hours as needed for mild pain or moderate pain.     bisoprolol  (ZEBETA ) 5 MG tablet Take 1 tablet (5 mg total) by mouth daily. 30 tablet 6   chlorthalidone  (HYGROTON ) 25 MG tablet Take 1.5 tablets (37.5 mg total) by mouth daily. 135 tablet 3   ferrous sulfate  325 (65 FE) MG tablet Take 1 tablet (325 mg total) by mouth every other day.     furosemide  (LASIX ) 40 MG tablet Take 0.5 tablets (20 mg total) by mouth 2 (two) times daily as needed for edema or fluid. 30 tablet 3   Magnesium  500 MG TABS  Take 1 tablet by mouth every other day.     Multiple Vitamins-Minerals (CENTRUM MINIS ADULTS 50+) TABS Take 1 tablet by mouth daily in the afternoon.     triamcinolone  cream (KENALOG ) 0.1 % Apply 1 Application topically 2 (two) times daily as needed (for atopic dermatitis- affected areas). 30 g    VITAMIN D, ERGOCALCIFEROL, PO Take by mouth.     ezetimibe  (ZETIA ) 10 MG tablet Take 1 tablet (10 mg total) by mouth daily. (Patient not taking: Reported on 03/23/2024) 90 tablet 3   No current  facility-administered medications for this visit.     Assessment & Plan    Hypertension Assessment: BP is uncontrolled in office BP 152/90 mmHg;  above the goal (<130/80). Still having asthma symptoms despite selective beta blocker  Tolerates chlorthalidone  well without any side effects Denies palpitation, chest pain, headaches,or swelling Reiterated the importance of regular exercise and low salt diet   Plan:  Stop taking metoprolol .  Wait 1 week then start bisoprolol  5 mg daily (in hopes of decreasing asthmatic symptoms) Decrease daily sodium intake to around 1500 mg Continue taking chlorthalidone  37.5 mg daily Patient to keep record of BP readings with heart rate and report to us  at the next visit Patient to follow up with Dr. Theodis Fiscal in 2 months  Labs ordered today:  none   Donivan Furry PharmD CPP [facility]  [address] [place], [place] [address] [phone]

## 2024-04-05 ENCOUNTER — Encounter (HOSPITAL_BASED_OUTPATIENT_CLINIC_OR_DEPARTMENT_OTHER): Payer: Self-pay | Admitting: Cardiovascular Disease

## 2024-04-06 MED ORDER — MINOXIDIL 2.5 MG PO TABS: 2.5000 mg | ORAL_TABLET | Freq: Two times a day (BID) | ORAL | 3 refills | Status: DC

## 2024-04-18 ENCOUNTER — Ambulatory Visit (INDEPENDENT_AMBULATORY_CARE_PROVIDER_SITE_OTHER)

## 2024-04-18 VITALS — BP 152/90 | Ht 65.0 in | Wt 247.0 lb

## 2024-04-18 DIAGNOSIS — Z0000 Encounter for general adult medical examination without abnormal findings: Secondary | ICD-10-CM

## 2024-04-18 DIAGNOSIS — Z5320 Procedure and treatment not carried out because of patient's decision for unspecified reasons: Secondary | ICD-10-CM

## 2024-04-18 NOTE — Progress Notes (Signed)
 Because this visit was a virtual/telehealth visit,  certain criteria was not obtained, such a blood pressure, CBG if applicable, and timed get up and go. Any medications not marked as "taking" were not mentioned during the medication reconciliation part of the visit. Any vitals not documented were not able to be obtained due to this being a telehealth visit or patient was unable to self-report a recent blood pressure reading due to a lack of equipment at home via telehealth. Vitals that have been documented are verbally provided by the patient.   This visit was performed by a medical professional under my direct supervision. I was immediately available for consultation/collaboration. I have reviewed and agree with the Annual Wellness Visit documentation.  Subjective:   Jenny Martin is a 78 y.o. who presents for a Medicare Wellness preventive visit.  As a reminder, Annual Wellness Visits don't include a physical exam, and some assessments may be limited, especially if this visit is performed virtually. We may recommend an in-person follow-up visit with your provider if needed.  Visit Complete: Virtual I connected with  Jenny Martin on 04/18/24 by a audio enabled telemedicine application and verified that I am speaking with the correct person using two identifiers.  Patient Location: Home  Provider Location: Home Office  I discussed the limitations of evaluation and management by telemedicine. The patient expressed understanding and agreed to proceed.  Vital Signs: Because this visit was a virtual/telehealth visit, some criteria may be missing or patient reported. Any vitals not documented were not able to be obtained and vitals that have been documented are patient reported.  VideoDeclined- This patient declined Librarian, academic. Therefore the visit was completed with audio only.  Persons Participating in Visit: Patient.  AWV Questionnaire: No: Patient  Medicare AWV questionnaire was not completed prior to this visit.  Cardiac Risk Factors include: advanced age (>29men, >44 women);hypertension;obesity (BMI >30kg/m2)     Objective:     Today's Vitals   04/18/24 1133  BP: (!) 152/90  Weight: 247 lb (112 kg)  Height: 5\' 5"  (1.651 m)   Body mass index is 41.1 kg/m.     04/18/2024   11:33 AM 02/23/2024    4:25 PM 06/11/2023   12:57 PM 12/28/2022   12:03 AM 12/20/2022    1:17 PM 12/17/2022    5:12 PM 12/16/2022   11:02 AM  Advanced Directives  Does Patient Have a Medical Advance Directive? No No No No No Yes Yes  Type of Careers adviser;Living will Living will;Healthcare Power of Attorney  Does patient want to make changes to medical advance directive?    No - Patient declined  No - Patient declined   Copy of Healthcare Power of Attorney in Chart?    No - copy requested  No - copy requested   Would patient like information on creating a medical advance directive? No - Patient declined No - Patient declined No - Patient declined No - Patient declined  No - Patient declined     Current Medications (verified) Outpatient Encounter Medications as of 04/18/2024  Medication Sig   acetaminophen  (TYLENOL ) 325 MG tablet Take 650 mg by mouth every 6 (six) hours as needed for fever, headache, moderate pain or mild pain.   albuterol  (VENTOLIN  HFA) 108 (90 Base) MCG/ACT inhaler Inhale 1-2 puffs into the lungs every 6 (six) hours as needed for wheezing or shortness of breath. Future refills per primary care  provider.   aspirin  325 MG tablet Take 325 mg by mouth every 6 (six) hours as needed for mild pain or moderate pain.   chlorthalidone  (HYGROTON ) 25 MG tablet Take 1.5 tablets (37.5 mg total) by mouth daily.   ferrous sulfate  325 (65 FE) MG tablet Take 1 tablet (325 mg total) by mouth every other day.   furosemide  (LASIX ) 40 MG tablet Take 0.5 tablets (20 mg total) by mouth 2 (two) times daily as needed for edema or  fluid.   Magnesium  500 MG TABS Take 1 tablet by mouth every other day.   minoxidil  (LONITEN ) 2.5 MG tablet Take 1 tablet (2.5 mg total) by mouth 2 (two) times daily.   Multiple Vitamins-Minerals (CENTRUM MINIS ADULTS 50+) TABS Take 1 tablet by mouth daily in the afternoon.   triamcinolone  cream (KENALOG ) 0.1 % Apply 1 Application topically 2 (two) times daily as needed (for atopic dermatitis- affected areas).   VITAMIN D, ERGOCALCIFEROL, PO Take by mouth.   ezetimibe  (ZETIA ) 10 MG tablet Take 1 tablet (10 mg total) by mouth daily. (Patient not taking: Reported on 03/23/2024)   No facility-administered encounter medications on file as of 04/18/2024.    Allergies (verified) Clonidine  derivatives, Hydralazine , Atorvastatin, Bisoprolol , Hydrochlorothiazide, Losartan, Micardis  hct [telmisartan -hctz], Pravastatin, Statins, Telmisartan , Valsartan-hydrochlorothiazide, and Ibuprofen   History: Past Medical History:  Diagnosis Date   Abnormal TSH 12/16/2015   Allergic rhinitis 12/10/2015   Anemia    Arachnoid cyst    Arcus senilis of both eyes 12/10/2015   Atopic dermatitis    Blood transfusion without reported diagnosis    Cardiac murmur 12/07/2022   Carpal tunnel syndrome    Chest pain 06/13/2023   Chronic heart failure with preserved ejection fraction (HFpEF) (HCC) 06/12/2023   Chronic vulvitis    CKD stage 3b, 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   Cortical age-related cataract of both eyes 04/27/2018   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   Edema 12/10/2015   Gastroesophageal reflux disease without esophagitis 06/08/2022   H/O bladder infections    History of chicken pox    Hypercholesterolemia    Hyperlipidemia 12/10/2015   Hyperopia with astigmatism and presbyopia, bilateral 12/10/2015   Hypertension    Hypo-osmolality and hyponatremia 01/08/2022   Hypomagnesemia 01/03/2022   Hyponatremia 01/02/2022   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 04/27/2018    Medication intolerance 06/13/2023   Mild intermittent asthma 12/16/2015   Morbid obesity (HCC) 12/07/2022   Need for prophylactic vaccination with Streptococcus pneumoniae (Pneumococcus) and Influenza vaccines 12/16/2015   Normocytic anemia 01/03/2022   Nuclear sclerotic cataract of both eyes 04/25/2017   Obese    Palpitations 06/11/2023   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 CT Abdomen/Pelvis from 01/02/22 showed small right renal midpole low density cyst, appearing benign.      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   Uncontrolled hypertension 12/07/2022   Vitreous floaters, bilateral 04/25/2017   Past Surgical History:  Procedure Laterality Date   COLPOSCOPY  12/2008   normal   WRIST SURGERY     Family History  Problem Relation Age of Onset   COPD Mother    Asthma Mother    Hypertension Father    Stroke Father    Cancer Maternal Grandmother    Social History   Socioeconomic History   Marital status:  Divorced    Spouse name: Not on file   Number of children: Not on file   Years of education: Not on file   Highest education level: Not on file  Occupational History   Not on file  Tobacco Use   Smoking status: Never   Smokeless tobacco: Never  Vaping Use   Vaping status: Never Used  Substance and Sexual Activity   Alcohol use: No   Drug use: No   Sexual activity: Never  Other Topics Concern   Not on file  Social History Narrative   Not on file   Social Drivers of Health   Financial Resource Strain: Low Risk  (04/18/2024)   Overall Financial Resource Strain (CARDIA)    Difficulty of Paying Living Expenses: Not hard at all  Food Insecurity: No Food Insecurity (04/18/2024)   Hunger Vital Sign    Worried About Running Out of Food in the Last Year: Never true    Ran Out of Food in the Last Year: Never true   Transportation Needs: No Transportation Needs (04/18/2024)   PRAPARE - Administrator, Civil Service (Medical): No    Lack of Transportation (Non-Medical): No  Physical Activity: Insufficiently Active (04/18/2024)   Exercise Vital Sign    Days of Exercise per Week: 5 days    Minutes of Exercise per Session: 20 min  Stress: No Stress Concern Present (04/18/2024)   [employer] of Occupational Health - Occupational Stress Questionnaire    Feeling of Stress : Not at all  Social Connections: Moderately Integrated (04/18/2024)   Social Connection and Isolation Panel [NHANES]    Frequency of Communication with Friends and Family: More than three times a week    Frequency of Social Gatherings with Friends and Family: More than three times a week    Attends Religious Services: More than 4 times per year    Active Member of [employer] or Organizations: Yes    Attends Engineer, structural: More than 4 times per year    Marital Status: Divorced    Tobacco Counseling Counseling given: Not Answered    Clinical Intake:  Pre-visit preparation completed: Yes  Pain : No/denies pain     BMI - recorded: 41.1 Nutritional Status: BMI > 30  Obese Nutritional Risks: None Diabetes: No  No results found for: "HGBA1C"   How often do you need to have someone help you when you read instructions, pamphlets, or other written materials from your doctor or pharmacy?: 1 - Never  Interpreter Needed?: No  Information entered by :: [employer]   Activities of Daily Living     04/18/2024   11:37 AM 06/11/2023   11:00 PM  In your present state of health, do you have any difficulty performing the following activities:  Hearing? 0 0  Vision? 0 0  Difficulty concentrating or making decisions? 0 0  Walking or climbing stairs? 0 0  Dressing or bathing? 0 0  Doing errands, shopping? 0 0  Preparing Food and eating ? N   Using the Toilet? N   In the past six months, have you  accidently leaked urine? N   Do you have problems with loss of bowel control? N   Managing your Medications? N   Managing your Finances? N   Housekeeping or managing your Housekeeping? N     Patient Care Team: Everlina Hock, NP as PCP - General (Family Medicine) Maudine Sos, MD as PCP - Cardiology (Cardiology)  I have  updated your Care Teams any recent Medical Services you may have received from other providers in the past year.     Assessment:    This is a routine wellness examination for Lassie.  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 0  Number falls in past yr: 0  Injury with Fall? 0  Risk for fall due to : No Fall Risks  Follow up Falls evaluation completed    MEDICARE RISK AT HOME:  Medicare Risk at Home Any stairs in or around the home?: Yes If so, are there any without handrails?: No Home free of loose throw rugs in walkways, pet beds, electrical cords, etc?: Yes Adequate lighting in your home to reduce risk of falls?: Yes Life alert?: No Use of a cane, walker or w/c?: No Grab bars in the bathroom?: No Shower chair or bench in shower?: Yes Elevated toilet seat or a handicapped toilet?: No  TIMED UP AND GO:  Was the test performed?  No  Cognitive Function: 6CIT completed        04/18/2024   11:36 AM  6CIT Screen  What Year? 0 points  What month? 0 points  What time? 0 points  Count back from 20 0 points  Months in reverse 0 points  Repeat phrase 0 points  Total Score 0 points    Immunizations Immunization  History  Administered Date(s) Administered   Fluad Quad(high Dose 65+) 08/15/2023   Influenza Split 08/11/2012, 08/21/2015, 07/17/2020   Influenza, High Dose Seasonal PF 08/16/2016, 08/18/2017, 08/03/2018, 07/20/2019, 07/17/2020, 07/23/2021   Influenza-Unspecified 10/17/2004, 09/05/2006, 07/20/2019, 07/28/2022   PFIZER(Purple Top)SARS-COV-2 Vaccination 12/21/2019, 01/11/2020, 09/06/2020   Pfizer Covid-19 Vaccine Bivalent Booster 74yrs & up 08/10/2021, 08/26/2022   Pneumococcal Conjugate-13 12/16/2015   Pneumococcal Polysaccharide-23 07/16/2009, 08/15/2014   Tdap 07/16/2012, 09/19/2022   Zoster Recombinant(Shingrix) 08/03/2018, 10/02/2018    Screening Tests Health Maintenance  Topic Date Due   Hepatitis C Screening  Never done   COVID-19 Vaccine (6 - 2024-25 season) 09/18/2024 (Originally 07/17/2023)   INFLUENZA VACCINE  06/15/2024   Medicare Annual Wellness (AWV)  04/18/2025   DTaP/Tdap/Td (3 - Td or Tdap) 09/19/2032   Pneumonia Vaccine 71+ Years old  Completed   DEXA SCAN  Completed   Zoster Vaccines- Shingrix  Completed   HPV VACCINES  Aged Out   Meningococcal B Vaccine  Aged Out    Health Maintenance  Health Maintenance Due  Topic Date Due   Hepatitis C Screening  Never done   Health Maintenance Items Addressed:   Additional Screening:  Vision Screening: Recommended annual ophthalmology exams for early detection of glaucoma and other disorders of the eye. Would you like a referral to an eye doctor? No    Dental Screening: Recommended annual dental exams for proper oral hygiene  Community Resource Referral / Chronic Care Management: CRR required  this visit?  No   CCM required this visit?  No   Plan:    I have personally reviewed and noted the following in the patient's chart:   Medical and social history Use of alcohol, tobacco or illicit drugs  Current medications and supplements including opioid prescriptions. Patient is not currently taking opioid  prescriptions. Functional ability and status Nutritional status Physical activity Advanced directives List of other physicians Hospitalizations, surgeries, and ER visits in previous 12 months Vitals Screenings to include cognitive, depression, and falls Referrals and appointments  In addition, I have reviewed and discussed with patient certain preventive protocols, quality metrics, and best practice recommendations. A written personalized care plan for preventive services as well as general preventive health recommendations were provided to patient.   Freeda Jerry, [place]   04/18/2024   After Visit Summary: (MyChart) Due to this being a telephonic visit, the after visit summary with patients personalized plan was offered to patient via MyChart   Notes: Nothing significant to report at this time.

## 2024-05-19 ENCOUNTER — Encounter (HOSPITAL_BASED_OUTPATIENT_CLINIC_OR_DEPARTMENT_OTHER): Payer: Self-pay | Admitting: Urology

## 2024-05-19 ENCOUNTER — Emergency Department (HOSPITAL_BASED_OUTPATIENT_CLINIC_OR_DEPARTMENT_OTHER)

## 2024-05-19 ENCOUNTER — Emergency Department (HOSPITAL_BASED_OUTPATIENT_CLINIC_OR_DEPARTMENT_OTHER)
Admission: EM | Admit: 2024-05-19 | Discharge: 2024-05-19 | Disposition: A | Discharge status: Home / Self Care | Attending: Emergency Medicine | Admitting: Emergency Medicine

## 2024-05-19 ENCOUNTER — Other Ambulatory Visit: Payer: Self-pay

## 2024-05-19 DIAGNOSIS — D72829 Elevated white blood cell count, unspecified: Secondary | ICD-10-CM | POA: Diagnosis not present

## 2024-05-19 DIAGNOSIS — I130 Hypertensive heart and chronic kidney disease with heart failure and stage 1 through stage 4 chronic kidney disease, or unspecified chronic kidney disease: Secondary | ICD-10-CM | POA: Insufficient documentation

## 2024-05-19 DIAGNOSIS — J449 Chronic obstructive pulmonary disease, unspecified: Secondary | ICD-10-CM | POA: Diagnosis not present

## 2024-05-19 DIAGNOSIS — R059 Cough, unspecified: Secondary | ICD-10-CM | POA: Insufficient documentation

## 2024-05-19 DIAGNOSIS — D649 Anemia, unspecified: Secondary | ICD-10-CM | POA: Diagnosis not present

## 2024-05-19 DIAGNOSIS — R0600 Dyspnea, unspecified: Secondary | ICD-10-CM

## 2024-05-19 DIAGNOSIS — J441 Chronic obstructive pulmonary disease with (acute) exacerbation: Secondary | ICD-10-CM

## 2024-05-19 DIAGNOSIS — Z7982 Long term (current) use of aspirin: Secondary | ICD-10-CM | POA: Insufficient documentation

## 2024-05-19 DIAGNOSIS — I5032 Chronic diastolic (congestive) heart failure: Secondary | ICD-10-CM | POA: Diagnosis not present

## 2024-05-19 DIAGNOSIS — N189 Chronic kidney disease, unspecified: Secondary | ICD-10-CM | POA: Insufficient documentation

## 2024-05-19 DIAGNOSIS — R0602 Shortness of breath: Secondary | ICD-10-CM | POA: Diagnosis present

## 2024-05-19 LAB — RESP PANEL BY RT-PCR (RSV, FLU A&B, COVID)  RVPGX2
Influenza A by PCR: NEGATIVE
Influenza B by PCR: NEGATIVE
Resp Syncytial Virus by PCR: NEGATIVE
SARS Coronavirus 2 by RT PCR: NEGATIVE

## 2024-05-19 LAB — COMPREHENSIVE METABOLIC PANEL WITH GFR
ALT: 18 U/L (ref 0–44)
AST: 31 U/L (ref 15–41)
Albumin: 3.6 g/dL (ref 3.5–5.0)
Alkaline Phosphatase: 40 U/L (ref 38–126)
Anion gap: 11 (ref 5–15)
BUN: 45 mg/dL — ABNORMAL HIGH (ref 8–23)
CO2: 27 mmol/L (ref 22–32)
Calcium: 9 mg/dL (ref 8.9–10.3)
Chloride: 99 mmol/L (ref 98–111)
Creatinine, Ser: 1.68 mg/dL — ABNORMAL HIGH (ref 0.44–1.00)
GFR, Estimated: 31 mL/min — ABNORMAL LOW (ref >60)
Glucose, Bld: 100 mg/dL — ABNORMAL HIGH (ref 70–99)
Potassium: 4 mmol/L (ref 3.5–5.1)
Sodium: 136 mmol/L (ref 135–145)
Total Bilirubin: 0.3 mg/dL (ref 0.0–1.2)
Total Protein: 7.7 g/dL (ref 6.5–8.1)

## 2024-05-19 LAB — CBC WITH DIFFERENTIAL/PLATELET
Abs Immature Granulocytes: 0 K/uL (ref 0.00–0.07)
Basophils Absolute: 0 K/uL (ref 0.0–0.1)
Basophils Relative: 1 %
Eosinophils Absolute: 0.5 K/uL (ref 0.0–0.5)
Eosinophils Relative: 11 %
HCT: 29.8 % — ABNORMAL LOW (ref 36.0–46.0)
Hemoglobin: 9.6 g/dL — ABNORMAL LOW (ref 12.0–15.0)
Immature Granulocytes: 0 %
Lymphocytes Relative: 20 %
Lymphs Abs: 1 K/uL (ref 0.7–4.0)
MCH: 29.5 pg (ref 26.0–34.0)
MCHC: 32.2 g/dL (ref 30.0–36.0)
MCV: 91.7 fL (ref 80.0–100.0)
Monocytes Absolute: 0.6 K/uL (ref 0.1–1.0)
Monocytes Relative: 12 %
Neutro Abs: 2.7 K/uL (ref 1.7–7.7)
Neutrophils Relative %: 56 %
Platelets: 199 K/uL (ref 150–400)
RBC: 3.25 MIL/uL — ABNORMAL LOW (ref 3.87–5.11)
RDW: 12.9 % (ref 11.5–15.5)
WBC: 4.8 K/uL (ref 4.0–10.5)
nRBC: 0 % (ref 0.0–0.2)

## 2024-05-19 LAB — TROPONIN T, HIGH SENSITIVITY
Troponin T High Sensitivity: 73 ng/L — ABNORMAL HIGH (ref <19)
Troponin T High Sensitivity: 68 ng/L — ABNORMAL HIGH

## 2024-05-19 LAB — PRO BRAIN NATRIURETIC PEPTIDE: Pro Brain Natriuretic Peptide: 244 pg/mL (ref <300.0)

## 2024-05-19 MED ORDER — METHYLPREDNISOLONE SODIUM SUCC 125 MG IJ SOLR
125.0000 mg | Freq: Once | INTRAMUSCULAR | Status: AC
  Administered 2024-05-19: 125 mg via INTRAVENOUS
  Filled 2024-05-19: qty 2

## 2024-05-19 MED ORDER — PREDNISONE 20 MG PO TABS: 40.0000 mg | ORAL_TABLET | Freq: Every day | ORAL | 0 refills | Status: AC

## 2024-05-19 MED ORDER — IPRATROPIUM-ALBUTEROL 0.5-2.5 (3) MG/3ML IN SOLN
3.0000 mL | Freq: Once | RESPIRATORY_TRACT | Status: AC
  Administered 2024-05-19: 3 mL via RESPIRATORY_TRACT

## 2024-05-19 NOTE — ED Triage Notes (Addendum)
 Pt states shortness of breath x 2-3 weeks, states it is a side effect of her BP medication (chlorthalidone ) Using inhaler with some relief  No cough   Hypertensive at triage, states took all meds this am

## 2024-05-19 NOTE — Progress Notes (Signed)
 Received a call from [facility] ED about Ms. Rapozo who presented to the ED with shortness of breath and hypertension (200/98).   Per ED, she had wheezes on exam. Breathing improved with steroids and breathing treatment. Troponins were 73 and 68. Cardiology called to discuss possible etiologies of her troponin elevation.   On my chart review, her EKG is without ischemic changes. Her CT PE from 2023 did not have coronary calcifications. Her troponins are mildly elevated and flat in the setting of hypertension ans asthma excerebration and CKD. Given we have possible etiologies of her troponin elevation, and no labs imaging suggestive of ischemia, would not recommend further inpatient cardiac evaluation. Her blood pressures improved to 160s/77 without intervention. I would not recommend any changes to antihypertensives at this time. She has an advanced hypertension clinic appointment on 05/22/2024.     Merlene Blood, MD MS  [facility] Cardiology [place]

## 2024-05-19 NOTE — Discharge Instructions (Addendum)
 As discussed, your workup today was overall reassuring.  Will send you home with a few days worth of steroids for treatment of asthma/COPD flareup.  Continue use your inhaler as needed.  Recommend follow-up with cardiology for reassessment.  Please not hesitate to return to the emergency department if the worrisome signs and symptoms we discussed become apparent.

## 2024-05-19 NOTE — ED Provider Notes (Signed)
 [facility] Provider Note   CSN: [id number] Arrival date & time: 05/19/24  1552     Patient presents with: Shortness of Breath   Jenny Martin is a 78 y.o. female.    Shortness of Breath   78 year old female presents emergency department with shortness of breath, cough.  Has had symptoms for past 2 to 3 weeks.  States that she thinks is a side effect to her chlorthalidone  as she feels short of breath whenever she takes this medication.  Has been intermittently taking this medication for her blood pressure.  States that she has multiple allergies to blood pressure medication has had difficulty finding the right medicine for her.  Does follow with cardiology who seems to manage her blood pressure.  Denies any fevers, chills, chest pain, abdominal pain, nausea, vomiting.  States that she was seen 3 months ago and her current presentation feels similar to her symptoms then.  Has been using her inhaler at home which has helped some.  Presents emergency department for further assessment/evaluation.  Past medical history significant for hypertension, hyperlipidemia, chronic diastolic heart failure, GERD, COPD, CKD  Prior to Admission medications   Medication Sig Start Date End Date Taking? Authorizing Provider  acetaminophen  (TYLENOL ) 325 MG tablet Take 650 mg by mouth every 6 (six) hours as needed for fever, headache, moderate pain or mild pain.    [provider]  albuterol  (VENTOLIN  HFA) 108 (90 Base) MCG/ACT inhaler Inhale 1-2 puffs into the lungs every 6 (six) hours as needed for wheezing or shortness of breath. Future refills per primary care provider. 03/19/24   Vannie Reche GORMAN, NP  aspirin  325 MG tablet Take 325 mg by mouth every 6 (six) hours as needed for mild pain or moderate pain.    [provider]  chlorthalidone  (HYGROTON ) 25 MG tablet Take 1.5 tablets (37.5 mg total) by mouth daily. 02/27/24 05/27/24  Raford Riggs,  MD  ezetimibe  (ZETIA ) 10 MG tablet Take 1 tablet (10 mg total) by mouth daily. Patient not taking: Reported on 03/23/2024 05/09/23   Vannie Reche GORMAN, NP  ferrous sulfate  325 (65 FE) MG tablet Take 1 tablet (325 mg total) by mouth every other day. 12/21/22   Revankar, Jennifer SAUNDERS, MD  furosemide  (LASIX ) 40 MG tablet Take 0.5 tablets (20 mg total) by mouth 2 (two) times daily as needed for edema or fluid. 05/10/23   Vannie Reche GORMAN, NP  Magnesium  500 MG TABS Take 1 tablet by mouth every other day.    [provider]  minoxidil  (LONITEN ) 2.5 MG tablet Take 1 tablet (2.5 mg total) by mouth 2 (two) times daily. 04/06/24   Raford Riggs, MD  Multiple Vitamins-Minerals (CENTRUM MINIS ADULTS 50+) TABS Take 1 tablet by mouth daily in the afternoon.    [provider]  triamcinolone  cream (KENALOG ) 0.1 % Apply 1 Application topically 2 (two) times daily as needed (for atopic dermatitis- affected areas). 12/21/22   Revankar, Jennifer SAUNDERS, MD  VITAMIN D, ERGOCALCIFEROL, PO Take by mouth.    [provider]    Allergies: Clonidine  derivatives, Hydralazine , Atorvastatin, Bisoprolol , Hydrochlorothiazide, Losartan, Micardis  hct [telmisartan -hctz], Pravastatin, Statins, Telmisartan , Valsartan-hydrochlorothiazide, and Ibuprofen    Review of Systems  Respiratory:  Positive for shortness of breath.   All other systems reviewed and are negative.   Updated Vital Signs BP (!) 161/102   Pulse 88   Temp 98.1 F (36.7 C) (Oral)   Resp 14   Ht 5'  5 (1.651 m)   Wt 112 kg   SpO2 100%   BMI 41.09 kg/m   Physical Exam Vitals and nursing note reviewed.  Constitutional:      General: She is not in acute distress.    Appearance: She is well-developed.  HENT:     Head: Normocephalic and atraumatic.  Eyes:     Conjunctiva/sclera: Conjunctivae normal.  Cardiovascular:     Rate and Rhythm: Normal rate and regular rhythm.     Heart sounds: No murmur heard. Pulmonary:     Effort: Pulmonary  effort is normal. No respiratory distress.     Breath sounds: Wheezing and rhonchi present.  Abdominal:     Palpations: Abdomen is soft.     Tenderness: There is no abdominal tenderness.  Musculoskeletal:        General: No swelling.     Cervical back: Neck supple.     Comments: 1+ pitting edema bilateral lower extremities.  Skin:    General: Skin is warm and dry.     Capillary Refill: Capillary refill takes less than 2 seconds.  Neurological:     Mental Status: She is alert.  Psychiatric:        Mood and Affect: Mood normal.     (all labs ordered are listed, but only abnormal results are displayed) Labs Reviewed  CBC WITH DIFFERENTIAL/PLATELET - Abnormal; Notable for the following components:      Result Value   RBC 3.25 (*)    Hemoglobin 9.6 (*)    HCT 29.8 (*)    All other components within normal limits  RESP PANEL BY RT-PCR (RSV, FLU A&B, COVID)  RVPGX2  COMPREHENSIVE METABOLIC PANEL WITH GFR  PRO BRAIN NATRIURETIC PEPTIDE  TROPONIN T, HIGH SENSITIVITY    EKG: None  Radiology: No results found.   Procedures   Medications Ordered in the ED  ipratropium-albuterol  (DUONEB) 0.5-2.5 (3) MG/3ML nebulizer solution 3 mL (3 mLs Nebulization Given 05/19/24 1639)  methylPREDNISolone  sodium succinate (SOLU-MEDROL ) 125 mg/2 mL injection 125 mg (125 mg Intravenous Given 05/19/24 1637)    Clinical Course as of 05/19/24 2035  Sat May 19, 2024  1917 Consulted with attending Dr. Francesca who recommended reaching to cardiology given elevated troponins.  [CR]  2035 Consulted cardiology Dr. Duffy reassured by patient's history as well as downtrending troponin and EKG.  Recommend outpatient follow-up as planned in 3 days with cardiology. [CR]    Clinical Course User Index [CR] Silver Wonda LABOR, PA                                 Medical Decision Making Amount and/or Complexity of Data Reviewed Labs: ordered. Radiology: ordered.  Risk Prescription drug management.   This  patient presents to the ED for concern of shortness of breath, this involves an extensive number of treatment options, and is a complaint that carries with it a high risk of complications and morbidity.  The differential diagnosis includes ACS, PE, pneumothorax, pneumonia, viral URI, COPD/asthma exacerbation, CHF, anemia, other   Co morbidities that complicate the patient evaluation  See HPI   Additional history obtained:  Additional history obtained from EMR External records from outside source obtained and reviewed including hospital records   Lab Tests:  I Ordered, and personally interpreted labs.  The pertinent results include: No Electra abnormalities.  Patient with baseline renal dysfunction, creatinine of 1.68, BUN 45 with GFR 31.  Leukocytosis.  Baseline anemia with hemoglobin 9.6 of which is normocytic.  BNP normal.  Viral testing normal.  Initial troponin of 73 with repeat 60   Imaging Studies ordered:  I ordered imaging studies including x-ray I independently visualized and interpreted imaging which showed no acute cardiopulmonary abnormality I agree with the radiologist interpretation   Cardiac Monitoring: / EKG:  The patient was maintained on a cardiac monitor.  I personally viewed and interpreted the cardiac monitored which showed an underlying rhythm of: Sinus rhythm.  rVH.  T wave inversion in V1.  No obvious acute ischemic change from prior EKG performed.   Consultations Obtained:  See ED course   Problem List / ED Course / Critical interventions / Medication management  Shortness of breath I ordered medication including albuterol , Solu-Medrol    Reevaluation of the patient after these medicines showed that the patient improved I have reviewed the patients home medicines and have made adjustments as needed   Social Determinants of Health:  Denies tobacco, illicit drug use.   Test / Admission - Considered:  Shortness of breath, wheeze Vitals signs  significant for initial hypertensive blood pressure of 200/98 which improved to 161/102. Otherwise within normal range and stable throughout visit. Laboratory/imaging studies significant for: See above 78 year old female presents emergency department with shortness of breath, cough.  Has had symptoms for past 2 to 3 weeks.  States that she thinks is a side effect to her chlorthalidone  as she feels short of breath whenever she takes this medication.  Has been intermittently taking this medication for her blood pressure.  States that she has multiple allergies to blood pressure medication has had difficulty finding the right medicine for her.  Does follow with cardiology who seems to manage her blood pressure.  Denies any fevers, chills, chest pain, abdominal pain, nausea, vomiting.  States that she was seen 3 months ago and her current presentation feels similar to her symptoms then.  Has been using her inhaler at home which has helped some.  Presents emergency department for further assessment/evaluation. On exam, diffuse wheezing present.  No abdominal tenderness.  Workup today reassuring.  Patient BNP normal without evidence of pulmonary vascular congestion/pedal effusion on chest x-ray imaging; low suspicion for CHF.  Patient was with elevated troponin initially in the 70s with repeat 68; EKG without obvious acute ischemic changes.  Consult cardiology as patient's troponin has been normal or slightly elevated in the past.  Cardiology reassured by patient's story as well as appearance of EKG; recommendation was to follow-up outpatient in 3 days with cardiologist as planned.  Chest x-ray without obvious pneumonia, pneumothorax or other acute cardiopulmonary abnormality.  Patient with known history of COPD and noted significant improvement of breathing with nebulizer therapy as well as Solu-Medrol .  Repeat assessment showed significant improvement of wheezing.  Suspect COPD exacerbation is most likely etiology of  patient's symptoms.  Will treat accordingly and recommend follow-up with primary care as well as cardiology as already planned.  Treatment plan discussed with patient and she acknowledged understanding was agreeable to said plan.  Patient well-appearing, afebrile in no acute distress. Worrisome signs and symptoms were discussed with the patient, and the patient acknowledged understanding to return to the ED if noticed. Patient was stable upon discharge.       Final diagnoses:  None    ED Discharge Orders     None          Silver Wonda LABOR, [place] 05/19/24 2038    Francesca Elsie CROME,  MD 05/20/24 1918

## 2024-05-19 NOTE — ED Notes (Signed)
 Pt stated leads would not stay on due to lotions and oils applied to skin. Call light placed within reach and pt is comfortable

## 2024-05-22 ENCOUNTER — Ambulatory Visit (INDEPENDENT_AMBULATORY_CARE_PROVIDER_SITE_OTHER): Admitting: Cardiovascular Disease

## 2024-05-22 ENCOUNTER — Encounter (HOSPITAL_BASED_OUTPATIENT_CLINIC_OR_DEPARTMENT_OTHER): Payer: Self-pay | Admitting: Cardiovascular Disease

## 2024-05-22 VITALS — BP 190/89 | HR 74 | Ht 65.0 in | Wt 258.0 lb

## 2024-05-22 DIAGNOSIS — E782 Mixed hyperlipidemia: Secondary | ICD-10-CM | POA: Diagnosis not present

## 2024-05-22 DIAGNOSIS — I10 Essential (primary) hypertension: Secondary | ICD-10-CM | POA: Diagnosis not present

## 2024-05-22 DIAGNOSIS — N1832 Chronic kidney disease, stage 3b: Secondary | ICD-10-CM | POA: Diagnosis not present

## 2024-05-22 DIAGNOSIS — Z5181 Encounter for therapeutic drug level monitoring: Secondary | ICD-10-CM

## 2024-05-22 DIAGNOSIS — I5032 Chronic diastolic (congestive) heart failure: Secondary | ICD-10-CM | POA: Diagnosis not present

## 2024-05-22 MED ORDER — CHLORTHALIDONE 25 MG PO TABS: 25.0000 mg | ORAL_TABLET | Freq: Every day | ORAL | 3 refills | Status: DC

## 2024-05-22 MED ORDER — VERAPAMIL HCL ER 120 MG PO TBCR: 120.0000 mg | EXTENDED_RELEASE_TABLET | Freq: Every day | ORAL | 3 refills | Status: DC

## 2024-05-22 NOTE — Progress Notes (Signed)
 [facility] Follow-up:    Date:  05/22/2024   ID:  Jenny Martin, DOB [birth year], MRN [id number]  PCP:  Almarie Waddell NOVAK, NP  Cardiologist:  Annabella Scarce, MD  Nephrologist:  Referring MD: Almarie Waddell NOVAK, NP   CC: Hypertension  History of Present Illness:    Jenny Martin is a 78 y.o. female with a hx of hypertension, hyperlipidemia, anemia, GERD, asthma, pneumonia, carpal tunnel syndrome, and obesity, here for follow-up.  She first established care in the [facility] 01/2023. On 12/02/2022 she presented to the ED with complaints of chest pain and shortness of breath. She noted that she had taken her first dose of carvedilol  the prior evening. She woke up with chest tightness and was hypertensive between 180 and 214 systolic. Her blood pressure on arrival was in the 190s but improved to the 140s on its own when brought to the exam room. Repeat troponin was normal. Urinalysis noted microscopic hematuria without infection. Her BP trended up to the 170s; she was given hydralazine  10 mg IV and Lasix  10 mg IV for peripheral edema. She returned to the ED the following day complaining of headaches. She wished to discontinue carvedilol  although she was advised that headaches would likely resolve after 1-2 weeks on the medication. She was referred to cardiology for outpatient workup and treatment of her high blood pressure.   She was seen by Dr. Edwyna 12/07/2022 and her BP was 180/73. Her EKG showed NSR and nonspecific ST-T changes. She was only taking 20 mg furosemide  daily. It was noted that she had known intolerance to multiple antihypertensives. She had wheezing with beta-blocker such as carvedilol . With ACE inhibitors she developed swelling and hyponatremia. Clonidine  0.1 mg BID was started. Renal artery dopplers 11/2022 showed no evidence of renal artery stenosis; a cyst was noted in the right upper pole. A murmur was heard on exam; she had an echocardiogram 12/2022  revealing LVEF 60-65%, mild LVH, and grade 1 diastolic dysfunction. There was mild mitral valve regurgitation and no evidence of valvular stenosis.  On 12/16/22 she presented to the ED with complaints of worsening LE edema, SOB, chest tightness, and tingling sensation of her RUE. She also complained of dry mouth and neck rash from clonidine . She had stopped her Lasix  about 1 week prior as it was ineffective. CBC and BMP with stable counts, BNP 101.6, and troponin x2 negative. EKG without ischemia, infarction, or arrhythmia. She was instructed to take her clonidine  when arriving home and restart her Lasix . The next day she returned to the ED with similar complaints and was found to have hyponatremia to 131. It was felt her symptoms may have had a psychosomatic component. Her clonidine  was discontinued and switched to irbesartan . She reported a prior adverse reaction of rash on telmisartan . She again was seen in the ED the following day with hypertensive concerns and was given a trial of bisoprolol  5 mg. On 12/20/22 in the ED she was hypertensive and requested a new medication, but they recommended follow up with her primary providers given her multiple intolerances. She saw her PCP 12/23/22 and her BP was 148/63. They started amlodipine  2.5 mg. She went back to the ED 12/27/22 with cough and Covid-19. She was hypertensive but no changes were made at that time. She saw her PCP 2/23 and blood pressure remained elevated, but they recommended waiting for cardiology follow-up. She followed up with Dr. Edwyna 01/11/2023 and her BP was 154/68. She had stopped taking  her midcations. She was instructed to take amlodipine  2.5 mg BID.  At her initial visit she was started on spironolactone .  She followed up with Reche Finder, NP. She had been prescribed doxazosin  but was afraid to take it due to concern about potential dizziness.  Amlodipine  was increased.  She has struggled with multiple medication intolerances and frequently  stops her medications soon after initiating a new medication.  At her visit 04/2023 blood pressure was over 200 systolic.  She did agree to try low-dose doxazosin .  She was hospitalized 05/2023 with palpitations and was found to have sinus tachycardia.  Blood pressures were remained uncontrolled.  She started on chlorthalidone  and both amlodipine  and clonidine  were discontinued due to potential side effects but she remained uncontrolled and was not interested in trying any other medications.  She wore an ambulatory monitor that revealed 52 runs of SVT and was recommended to start metoprolol .  At her visit 07/2023 she reported that she had tried half a tablet of metoprolol , but then stopped it after discovering a prior lawsuit online and potentially significant side effects. We discussed this at length, with reassurance. In the office her blood pressure was elevated to 198/85 initially, and 182/68 on manual recheck. She noted that she had tolerated triamterene-HCTZ in the past and wondered if she should retrial this.  She also noted that she was feeling very stressed and anxious.  She did agree to try metoprolol  and she was encouraged to work with her provider for stress and anxiety management.  At her visit 08/2023 Home blood pressures were averaging in the 140s though in the office it was 171/71.  We discussed limiting sodium intake and increase chlorthalidone .  She saw Reche Finder, NP 10/2023 and reported that home blood pressures were improved, though it was still elevated in the office.  She called the office 03/2024 reporting new side effects to all of her antihypertensives.  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  Despite our pharmacist reassurance, chlorthalidone  and metoprolol  were both reduced.  She then became frustrated that her blood pressure remained elevated.  She was started on nifedipine  and developed swelling.  She was  switched from metoprolol  to bisoprolol  and encouraged to reduce her sodium intake.  Chlorthalidone  was increased back to 37.5 mg.  Discussed the use of AI scribe software for clinical note transcription with the patient, who gave verbal consent to proceed.  History of Present Illness  Ms. Fenley complains of cold symptoms which she attributes to both her Zetia  and her chlorthalidone .  She notes that she did not have these symptoms prior to taking these medications and that she has read about the side effects online.  She notes that she has had to be seen by providers and has been given prednisone  for this.  She does report that she has a history of bad allergies as a child but no allergies recently.  She is insistent that her congestion is from her medications.  She is not exercising.  She does try to be mindful of what she is eating.  She has no chest pain, lower extremity edema, orthopnea, or PND.   Previous antihypertensives: Beta-blockers (bisoprolol , carvedilol )-wheezing ACE-swelling, hyponatremia Lasix -hyponatremia (otherwise tolerated) Hydrochlorothiazide-hyponatremia ARB (Telmisartan -rash), Losartan, Valsartan (made chest burn), Irbesartan  Clonidine -neck rash, dry mouth, SOB Hydralazine  Spironolactone  - swelling Amlodipine  Clonidine  Doxazosin  Nifedipine  - one tablet caused lip swelling and nausea  Past Medical History:  Diagnosis Date  Abnormal TSH 12/16/2015   Allergic rhinitis 12/10/2015   Anemia    Arachnoid cyst    Arcus senilis of both eyes 12/10/2015   Atopic dermatitis    Blood transfusion without reported diagnosis    Cardiac murmur 12/07/2022   Carpal tunnel syndrome    Chest pain 06/13/2023   Chronic heart failure with preserved ejection fraction (HFpEF) (HCC) 06/12/2023   Chronic vulvitis    CKD stage 3b, 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   Cortical age-related cataract of both eyes 04/27/2018   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   Edema 12/10/2015    Gastroesophageal reflux disease without esophagitis 06/08/2022   H/O bladder infections    History of chicken pox    Hypercholesterolemia    Hyperlipidemia 12/10/2015   Hyperopia with astigmatism and presbyopia, bilateral 12/10/2015   Hypertension    Hypo-osmolality and hyponatremia 01/08/2022   Hypomagnesemia 01/03/2022   Hyponatremia 01/02/2022   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 04/27/2018   Medication intolerance 06/13/2023   Mild intermittent asthma 12/16/2015   Morbid obesity (HCC) 12/07/2022   Need for prophylactic vaccination with Streptococcus pneumoniae (Pneumococcus) and Influenza vaccines 12/16/2015   Normocytic anemia 01/03/2022   Nuclear sclerotic cataract of both eyes 04/25/2017   Obese    Palpitations 06/11/2023   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 CT Abdomen/Pelvis from 01/02/22 showed small right renal midpole low density cyst, appearing benign.      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   albuterol  (VENTOLIN  HFA) 108 (90 Base) MCG/ACT inhaler Inhale 1-2 puffs into the lungs every 6 (six) hours as needed for wheezing or shortness of breath. Future refills per primary care provider.   aspirin  325 MG tablet Take 325 mg by mouth every 6 (six) hours as needed for mild pain or moderate pain.   ezetimibe  (ZETIA ) 10 MG tablet Take 1 tablet (10 mg total) by mouth daily.   ferrous sulfate  325 (65 FE) MG  tablet Take 1 tablet (325 mg total) by mouth every other day.   furosemide  (LASIX ) 40 MG tablet Take 0.5 tablets (20 mg total) by mouth 2 (two) times daily as needed for edema or fluid.   Magnesium  500 MG TABS Take 1 tablet by mouth every other day.   Multiple Vitamins-Minerals (CENTRUM MINIS ADULTS 50+) TABS Take 1 tablet by mouth daily in the afternoon.   predniSONE  (DELTASONE ) 20 MG tablet Take 2 tablets (40 mg total) by mouth daily with breakfast for 5 days.   triamcinolone  cream (KENALOG ) 0.1 % Apply 1 Application topically 2 (two) times daily as needed (for atopic dermatitis- affected areas).   verapamil  (CALAN -SR) 120 MG CR tablet Take 1 tablet (120 mg total) by mouth at bedtime.   VITAMIN D, ERGOCALCIFEROL, PO Take by mouth.   [DISCONTINUED] chlorthalidone  (HYGROTON ) 25 MG tablet Take 1.5 tablets (37.5 mg total) by mouth daily.     Allergies:  Clonidine  derivatives, Hydralazine , Atorvastatin, Bisoprolol , Hydrochlorothiazide, Losartan, Micardis  hct [telmisartan -hctz], Pravastatin, Statins, Telmisartan , Valsartan-hydrochlorothiazide, and Ibuprofen   Social History   Socioeconomic History   Marital status: Divorced    Spouse name: Not on file   Number of children: Not on file   Years of education: Not on file   Highest education level: Not on file  Occupational History   Not on file  Tobacco Use   Smoking status: Never   Smokeless tobacco: Never  Vaping Use   Vaping status: Never Used  Substance and Sexual Activity   Alcohol use: No   Drug use: No   Sexual activity: Never  Other Topics Concern   Not on file  Social History Narrative   Not on file   Social Drivers of Health   Financial Resource Strain: Low Risk  (04/18/2024)   Overall Financial Resource Strain (CARDIA)    Difficulty of Paying Living Expenses: Not hard at all  Food Insecurity: No Food Insecurity (04/18/2024)   Hunger Vital Sign    Worried About Running Out of Food in the Last Year: Never true    Ran Out  of Food in the Last Year: Never true  Transportation Needs: No Transportation Needs (04/18/2024)   PRAPARE - Administrator, Civil Service (Medical): No    Lack of Transportation (Non-Medical): No  Physical Activity: Insufficiently Active (04/18/2024)   Exercise Vital Sign    Days of Exercise per Week: 5 days    Minutes of Exercise per Session: 20 min  Stress: No Stress Concern Present (04/18/2024)   [employer] of Occupational Health - Occupational Stress Questionnaire    Feeling of Stress : Not at all  Social Connections: Moderately Integrated (04/18/2024)   Social Connection and Isolation Panel    Frequency of Communication with Friends and Family: More than three times a week    Frequency of Social Gatherings with Friends and Family: More than three times a week    Attends Religious Services: More than 4 times per year    Active Member of [employer] or Organizations: Yes    Attends Engineer, structural: More than 4 times per year    Marital Status: Divorced     Family History: The patient's family history includes Asthma in her mother; COPD in her mother; Cancer in her maternal grandmother; Hypertension in her father; Stroke in her father.  ROS:   Please see the history of present illness.    (+) Stress (+) Right LE pain All other systems reviewed and are negative.  EKGs/Labs/Other Studies Reviewed:    Monitor 06/2023: Patch Wear Time:  13 days and 15 hours (2024-08-01T15:38:27-0400 to 2024-08-15T07:36:38-0400)   Patient had a min HR of 57 bpm, max HR of 171 bpm, and avg HR of 75 bpm. Predominant underlying rhythm was Sinus Rhythm. Intermittent Bundle Branch Block was present.    52 Supraventricular Tachycardia runs occurred, the run with the fastest interval lasting 5 beats with a max rate of 171 bpm, the longest lasting 19.2 secs with an avg rate of 124 bpm. Supraventricular Tachycardia was detected within +/- 45 seconds of symptomatic patient event(s).  Isolated SVEs were rare (<1.0%), SVE Couplets were rare (<1.0%), and no SVE Triplets were present.    Isolated VEs were rare (<1.0%), VE Couplets were rare (<1.0%), and no VE Triplets were present.  Echocardiogram 12/22/2022: Sonographer Comments: Patient is obese. Restricted mobility, study  performed in Fowler's position.   IMPRESSIONS   1. GLS -19.4.  Left ventricular ejection fraction, by estimation, is 60 to  65%. The left ventricle has normal function. The left ventricle has no  regional wall motion abnormalities. There is mild left ventricular  hypertrophy. Left ventricular diastolic  parameters are consistent with Grade I diastolic dysfunction (impaired  relaxation).   2. Right ventricular systolic function is normal. The right ventricular  size is normal.   3. The mitral valve is normal in structure. Mild mitral valve  regurgitation. No evidence of mitral stenosis.   4. The aortic valve is normal in structure. Aortic valve regurgitation is  not visualized. No aortic stenosis is present.   5. The inferior vena cava is normal in size with greater than 50%  respiratory variability, suggesting right atrial pressure of 3 mmHg.   Bilateral Renal Artery Dopplers  12/15/2022: Summary:  Largest Aortic Diameter: 2.3 cm    Renal:  Right: Cyst(s) noted. RRV flow present. Normal size right kidney.         Abnormal right Resistive Index. Normal cortical thickness of         right kidney. No evidence of right renal artery stenosis.  Left:  Normal size of left kidney. Abnormal left Resisitve Index.         Normal cortical thickness of the left kidney. LRV flow         present. No evidence of left renal artery stenosis.  Mesenteric:  Normal Celiac artery and Superior Mesenteric artery findings.    EKG:  EKG is personally reviewed. 08/17/2023: Not ordered. 07/26/2023:  Not ordered. 02/02/2023:  Not ordered.  Recent Labs: 06/02/2023: BNP 101.0 06/13/2023: Magnesium  2.3 05/19/2024: ALT 18;  BUN 45; Creatinine, Ser 1.68; Hemoglobin 9.6; Platelets 199; Potassium 4.0; Pro Brain Natriuretic Peptide 244.0; Sodium 136   Recent Lipid Panel No results found for: CHOL, TRIG, HDL, CHOLHDL, VLDL, LDLCALC, LDLDIRECT  Physical Exam:    VS:  BP (!) 190/89 (BP Location: Left Arm, Patient Position: Sitting, Cuff Size: Large)   Pulse 74   Ht 5' 5 (1.651 m)   Wt 258 lb (117 kg)   SpO2 95%   BMI 42.93 kg/m  , BMI Body mass index is 42.93 kg/m. GENERAL:  Well appearing HEENT: Pupils equal round and reactive, fundi not visualized, oral mucosa unremarkable NECK:  No jugular venous distention, waveform within normal limits, carotid upstroke brisk and symmetric, no bruits, no thyromegaly LUNGS:  Clear to auscultation bilaterally HEART:  RRR.  PMI not displaced or sustained,S1 and S2 within normal limits, no S3, no S4, no clicks, no rubs, II/VI systolic murmur at the LUSB ABD:  Flat, positive bowel sounds normal in frequency in pitch, no bruits, no rebound, no guarding, no midline pulsatile mass, no hepatomegaly, no splenomegaly EXT:  2 plus pulses throughout, no LE edema, no cyanosis, no clubbing SKIN:  No rashes, no nodules NEURO:  Cranial nerves II through XII grossly intact, motor grossly intact throughout PSYCH:  Cognitively intact, oriented to person place and time   ASSESSMENT/PLAN:    # Hypertension: Blood pressures remain difficult to control and she struggles with intolerance to multiple medications.  Have never heard of anyone having myalgias or flulike symptoms from chlorthalidone  and this is not a listed side effect.  It certainly could happen with Zetia .  I have encouraged her to reduce her chlorthalidone  to 25 mg.  She also has palpitations so we will start verapamil  120 mg daily.  She understands that we are running out of options to try and I  do not feel comfortable stopping the chlorthalidone .  # Palpitations: Starting verapamil  as above.  #  Hyperlipidemia: Continue Zetia  for now.  Screening for Secondary Hypertension:      02/02/2023    9:45 AM  Causes  Drugs/Herbals Screened     - Comments No EtOH.  No NSAIDs.  Tries to limit sodium.  1 coffee daily  Renovascular HTN Screened     - Comments Normal 11/2022  Sleep Apnea N/A     - Comments no snoring  Thyroid Disease Screened     - Comments normal 07/2022  Hyperaldosteronism N/A  Pheochromocytoma N/A  Cushing's Syndrome N/A  Hyperparathyroidism N/A  Coarctation of the Aorta Screened  Compliance Screened     - Comments BP higher R>L    Relevant Labs/Studies:    Latest Ref Rng & Units 05/19/2024    4:31 PM 02/23/2024    6:26 PM 08/24/2023   10:25 AM  Basic Labs  Sodium 135 - 145 mmol/L 136  139  140   Potassium 3.5 - 5.1 mmol/L 4.0  4.3  4.2   Creatinine 0.44 - 1.00 mg/dL 8.31  8.40  8.56                    12/15/2022    9:41 AM  Renovascular   Renal Artery US  Completed Yes      Disposition:    FU in [facility] in 1-2 months.  Medication Adjustments/Labs and Tests Ordered: Current medicines are reviewed at length with the patient today.  Concerns regarding medicines are outlined above.   Orders Placed This Encounter  Procedures   Basic metabolic panel with GFR   Meds ordered this encounter  Medications   verapamil  (CALAN -SR) 120 MG CR tablet    Sig: Take 1 tablet (120 mg total) by mouth at bedtime.    Dispense:  90 tablet    Refill:  3   chlorthalidone  (HYGROTON ) 25 MG tablet    Sig: Take 1 tablet (25 mg total) by mouth daily.    Dispense:  90 tablet    Refill:  3     Signed, Annabella Scarce, MD  05/22/2024 2:05 PM    [facility]

## 2024-05-22 NOTE — Patient Instructions (Addendum)
 Medication Instructions:  Your physician has recommended you make the following change in your medication:   Decrease Chlorthalidone  25mg  daily   Start Verapamil  120mg  daily   Labs: BMET IN 1 WEEK   Follow-Up: Please follow up in 2-3 months in [facility] with Dr. Raford, Reche Finder, NP or Allean Mink PharmD

## 2024-05-30 ENCOUNTER — Encounter (HOSPITAL_BASED_OUTPATIENT_CLINIC_OR_DEPARTMENT_OTHER): Payer: Self-pay | Admitting: Cardiovascular Disease

## 2024-05-31 LAB — BASIC METABOLIC PANEL WITH GFR
BUN/Creatinine Ratio: 32 — ABNORMAL HIGH (ref 12–28)
BUN: 48 mg/dL — ABNORMAL HIGH (ref 8–27)
CO2: 23 mmol/L (ref 20–29)
Calcium: 9.2 mg/dL (ref 8.7–10.3)
Chloride: 98 mmol/L (ref 96–106)
Creatinine, Ser: 1.52 mg/dL — ABNORMAL HIGH (ref 0.57–1.00)
Glucose: 77 mg/dL (ref 70–99)
Potassium: 4.6 mmol/L (ref 3.5–5.2)
Sodium: 136 mmol/L (ref 134–144)
eGFR: 35 mL/min/1.73 — ABNORMAL LOW (ref >59)

## 2024-06-03 ENCOUNTER — Encounter: Payer: Self-pay | Admitting: Family Medicine

## 2024-06-04 ENCOUNTER — Ambulatory Visit: Payer: Self-pay | Admitting: Cardiovascular Disease

## 2024-06-06 ENCOUNTER — Encounter: Payer: Self-pay | Admitting: Family Medicine

## 2024-06-06 ENCOUNTER — Ambulatory Visit (INDEPENDENT_AMBULATORY_CARE_PROVIDER_SITE_OTHER): Admitting: Family Medicine

## 2024-06-06 VITALS — BP 151/80 | HR 90 | Ht 65.0 in | Wt 258.0 lb

## 2024-06-06 DIAGNOSIS — L720 Epidermal cyst: Secondary | ICD-10-CM

## 2024-06-06 NOTE — Progress Notes (Signed)
   Acute Office Visit  Subjective:     Patient ID: Jenny Martin, female    DOB: 1946-07-22, 78 y.o.   MRN: [id number]  Chief Complaint  Patient presents with   Mass    HPI Patient is in today for leg lumps.  Discussed the use of AI scribe software for clinical note transcription with the patient, who gave verbal consent to proceed.  History of Present Illness Jenny Martin is a 78 year old female who presents with concerns about spots on her legs.  She has multiple lumps on her legs that have been present for years. She is unsure if the areas are getting bigger or harder, but there are no rapid changes. There is no associated pain with these spots.  In the 1980s, she had cysts removed from her leg and the back of her neck. She is concerned whether the current spots are also cysts.  She experiences no symptoms like her leg falling asleep, getting cold, or tingling.           ROS All review of systems negative except what is listed in the HPI      Objective:    BP (!) 151/80   Pulse 90   Ht 5' 5 (1.651 m)   Wt 258 lb (117 kg)   SpO2 97%   BMI 42.93 kg/m    Physical Exam Vitals reviewed.  Constitutional:      Appearance: Normal appearance.  Skin:    General: Skin is warm and dry.     Findings: No bruising, erythema or rash.     Comments: Cyst-like nodules to right leg - see picture  Neurological:     Mental Status: She is alert and oriented to person, place, and time.  Psychiatric:        Mood and Affect: Mood normal.        Behavior: Behavior normal.        Thought Content: Thought content normal.        Judgment: Judgment normal.       Right upper leg cyst, firm, mobile, non-tender:    Right mid-leg cyst, firm, mobile, non-tender:     No results found for any visits on 06/06/24.      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 No rapid changes or symptoms. Superficial, unlikely to cause major issues. Prefers monitoring over removal. - Monitor for changes in size, pain, or skin changes. - Consider ultrasound if symptoms develop or further evaluation desired.  Hypertension Blood pressure improved, latest 151/80 mmHg. Following with cardiology for management.        No orders of the defined types were placed in this encounter.   Return if symptoms worsen or fail to improve.  Waddell KATHEE Mon, NP

## 2024-07-11 ENCOUNTER — Other Ambulatory Visit (HOSPITAL_BASED_OUTPATIENT_CLINIC_OR_DEPARTMENT_OTHER): Payer: Self-pay | Admitting: Family

## 2024-07-11 DIAGNOSIS — R0602 Shortness of breath: Secondary | ICD-10-CM

## 2024-07-11 DIAGNOSIS — I1A0 Resistant hypertension: Secondary | ICD-10-CM

## 2024-08-16 ENCOUNTER — Other Ambulatory Visit: Payer: Self-pay

## 2024-08-16 ENCOUNTER — Emergency Department (HOSPITAL_BASED_OUTPATIENT_CLINIC_OR_DEPARTMENT_OTHER)

## 2024-08-16 ENCOUNTER — Emergency Department (HOSPITAL_BASED_OUTPATIENT_CLINIC_OR_DEPARTMENT_OTHER)
Admission: EM | Admit: 2024-08-16 | Discharge: 2024-08-16 | Disposition: A | Discharge status: Home / Self Care | Attending: Emergency Medicine | Admitting: Emergency Medicine

## 2024-08-16 ENCOUNTER — Encounter (HOSPITAL_BASED_OUTPATIENT_CLINIC_OR_DEPARTMENT_OTHER): Payer: Self-pay

## 2024-08-16 DIAGNOSIS — Z79899 Other long term (current) drug therapy: Secondary | ICD-10-CM | POA: Diagnosis not present

## 2024-08-16 DIAGNOSIS — I130 Hypertensive heart and chronic kidney disease with heart failure and stage 1 through stage 4 chronic kidney disease, or unspecified chronic kidney disease: Secondary | ICD-10-CM | POA: Diagnosis not present

## 2024-08-16 DIAGNOSIS — R0602 Shortness of breath: Secondary | ICD-10-CM | POA: Diagnosis present

## 2024-08-16 DIAGNOSIS — R059 Cough, unspecified: Secondary | ICD-10-CM | POA: Diagnosis not present

## 2024-08-16 DIAGNOSIS — I5030 Unspecified diastolic (congestive) heart failure: Secondary | ICD-10-CM | POA: Insufficient documentation

## 2024-08-16 DIAGNOSIS — Z7982 Long term (current) use of aspirin: Secondary | ICD-10-CM | POA: Diagnosis not present

## 2024-08-16 DIAGNOSIS — N1832 Chronic kidney disease, stage 3b: Secondary | ICD-10-CM | POA: Insufficient documentation

## 2024-08-16 DIAGNOSIS — J441 Chronic obstructive pulmonary disease with (acute) exacerbation: Secondary | ICD-10-CM | POA: Diagnosis not present

## 2024-08-16 LAB — BASIC METABOLIC PANEL WITH GFR
Anion gap: 11 (ref 5–15)
BUN: 37 mg/dL — ABNORMAL HIGH (ref 8–23)
CO2: 27 mmol/L (ref 22–32)
Calcium: 9.2 mg/dL (ref 8.9–10.3)
Chloride: 103 mmol/L (ref 98–111)
Creatinine, Ser: 1.38 mg/dL — ABNORMAL HIGH (ref 0.44–1.00)
GFR, Estimated: 39 mL/min — ABNORMAL LOW (ref >60)
Glucose, Bld: 94 mg/dL (ref 70–99)
Potassium: 4.3 mmol/L (ref 3.5–5.1)
Sodium: 141 mmol/L (ref 135–145)

## 2024-08-16 LAB — RESP PANEL BY RT-PCR (RSV, FLU A&B, COVID)  RVPGX2
Influenza A by PCR: NEGATIVE
Influenza B by PCR: NEGATIVE
Resp Syncytial Virus by PCR: NEGATIVE
SARS Coronavirus 2 by RT PCR: NEGATIVE

## 2024-08-16 LAB — CBC
HCT: 30.9 % — ABNORMAL LOW (ref 36.0–46.0)
Hemoglobin: 10.1 g/dL — ABNORMAL LOW (ref 12.0–15.0)
MCH: 29.9 pg (ref 26.0–34.0)
MCHC: 32.7 g/dL (ref 30.0–36.0)
MCV: 91.4 fL (ref 80.0–100.0)
Platelets: 211 K/uL (ref 150–400)
RBC: 3.38 MIL/uL — ABNORMAL LOW (ref 3.87–5.11)
RDW: 13.2 % (ref 11.5–15.5)
WBC: 4.4 K/uL (ref 4.0–10.5)
nRBC: 0 % (ref 0.0–0.2)

## 2024-08-16 LAB — PRO BRAIN NATRIURETIC PEPTIDE: Pro Brain Natriuretic Peptide: 414 pg/mL — ABNORMAL HIGH (ref <300.0)

## 2024-08-16 MED ORDER — DOXYCYCLINE HYCLATE 100 MG PO CAPS: 100.0000 mg | ORAL_CAPSULE | Freq: Two times a day (BID) | ORAL | 0 refills | Status: DC

## 2024-08-16 MED ORDER — DOXYCYCLINE HYCLATE 100 MG PO TABS
100.0000 mg | ORAL_TABLET | Freq: Once | ORAL | Status: AC
  Administered 2024-08-16: 100 mg via ORAL
  Filled 2024-08-16: qty 1

## 2024-08-16 MED ORDER — PREDNISONE 20 MG PO TABS: ORAL_TABLET | ORAL | 0 refills | Status: DC

## 2024-08-16 MED ORDER — PREDNISONE 50 MG PO TABS
60.0000 mg | ORAL_TABLET | Freq: Once | ORAL | Status: AC
  Administered 2024-08-16: 60 mg via ORAL
  Filled 2024-08-16: qty 1

## 2024-08-16 MED ORDER — IPRATROPIUM-ALBUTEROL 0.5-2.5 (3) MG/3ML IN SOLN
3.0000 mL | Freq: Once | RESPIRATORY_TRACT | Status: AC
  Administered 2024-08-16: 3 mL via RESPIRATORY_TRACT
  Filled 2024-08-16: qty 3

## 2024-08-16 NOTE — ED Triage Notes (Signed)
 Pt states that she has been having an increased work of breathing. States that she has had to use her rescue inhaler a lot over the past few days

## 2024-08-16 NOTE — Discharge Instructions (Addendum)
Have close follow-up with your primary care doctor.  Return to the emergency room if you have any worsening symptoms.

## 2024-08-16 NOTE — ED Provider Notes (Signed)
 [facility] Provider Note   CSN: [id number] Arrival date & time: 08/16/24  1842     Patient presents with: Shortness of Breath   Jenny Martin is a 78 y.o. female.   Patient is a 78 year old female who presents with shortness of breath.  She has a history of asthma versus COPD.  She also has a history of CHF.  She uses an inhaler at home when she has a flareup.  She says she only has flareups about twice a year.  Other than that, she does not use her inhaler but maybe once or twice a month.  She is not on any preventative medicine.  She says she got a flu shot about 2 weeks ago and she has had about a week or so history of some URI symptoms with runny nose and congestion.  She has also had associated wheezing, cough and shortness of breath.  Her cough is productive of some yellow sputum at times.  No known fevers.  No associated chest pain.  She has some baseline leg swelling which she says is actually better than typical.  No calf pain.       Prior to Admission medications   Medication Sig Start Date End Date Taking? Authorizing Provider  doxycycline  (VIBRAMYCIN ) 100 MG capsule Take 1 capsule (100 mg total) by mouth 2 (two) times daily. One po bid x 7 days 08/16/24  Yes Lenor Hollering, MD  predniSONE  (DELTASONE ) 20 MG tablet 1 tabs po daily x 4 days 08/16/24  Yes Lenor Hollering, MD  acetaminophen  (TYLENOL ) 325 MG tablet Take 650 mg by mouth every 6 (six) hours as needed for fever, headache, moderate pain or mild pain.    [provider]  albuterol  (VENTOLIN  HFA) 108 (90 Base) MCG/ACT inhaler INHALE 1-2 PUFFS INTO THE LUNGS EVERY 6 HOURS AS NEEDED FOR WHEEZING OR SHORTNESS OF BREATH 07/12/24   Walker, Caitlin S, NP  aspirin  325 MG tablet Take 325 mg by mouth every 6 (six) hours as needed for mild pain or moderate pain.    [provider]  chlorthalidone  (HYGROTON ) 25 MG tablet Take 1 tablet (25 mg total) by mouth daily. 05/22/24  08/20/24  Raford Riggs, MD  ezetimibe  (ZETIA ) 10 MG tablet Take 1 tablet (10 mg total) by mouth daily. 05/09/23   Vannie Reche GORMAN, NP  ferrous sulfate  325 (65 FE) MG tablet Take 1 tablet (325 mg total) by mouth every other day. 12/21/22   Revankar, Jennifer SAUNDERS, MD  furosemide  (LASIX ) 40 MG tablet Take 0.5 tablets (20 mg total) by mouth 2 (two) times daily as needed for edema or fluid. 05/10/23   Walker, Caitlin S, NP  Magnesium  500 MG TABS Take 1 tablet by mouth every other day.    [provider]  minoxidil  (LONITEN ) 2.5 MG tablet Take 1 tablet (2.5 mg total) by mouth 2 (two) times daily. 04/06/24   Raford Riggs, MD  Multiple Vitamins-Minerals (CENTRUM MINIS ADULTS 50+) TABS Take 1 tablet by mouth daily in the afternoon.    [provider]  triamcinolone  cream (KENALOG ) 0.1 % Apply 1 Application topically 2 (two) times daily as needed (for atopic dermatitis- affected areas). 12/21/22   Revankar, Jennifer SAUNDERS, MD  verapamil  (CALAN -SR) 120 MG CR tablet Take 1 tablet (120 mg total) by mouth at bedtime. 05/22/24   Raford Riggs, MD  VITAMIN D, ERGOCALCIFEROL, PO Take by mouth.    [provider]    Allergies: Clonidine   derivatives, Hydralazine , Atorvastatin, Bisoprolol , Hydrochlorothiazide, Losartan, Micardis  hct [telmisartan -hctz], Pravastatin, Statins, Telmisartan , Valsartan-hydrochlorothiazide, and Ibuprofen    Review of Systems  Constitutional:  Positive for fatigue. Negative for chills, diaphoresis and fever.  HENT:  Positive for congestion and rhinorrhea. Negative for sneezing.   Eyes: Negative.   Respiratory:  Positive for cough, shortness of breath and wheezing. Negative for chest tightness.   Cardiovascular:  Positive for leg swelling (At baseline). Negative for chest pain.  Gastrointestinal:  Negative for abdominal pain, diarrhea, nausea and vomiting.  Genitourinary:  Negative for difficulty urinating, flank pain and frequency.  Musculoskeletal:  Negative for  arthralgias and back pain.  Skin:  Negative for rash.  Neurological:  Negative for dizziness, speech difficulty, weakness, numbness and headaches.    Updated Vital Signs BP (!) 162/96   Pulse 81   Temp 98.4 F (36.9 C) (Oral)   Resp 16   Ht 5' 5 (1.651 m)   Wt 113.4 kg   SpO2 97%   BMI 41.60 kg/m   Physical Exam Constitutional:      Appearance: She is well-developed.  HENT:     Head: Normocephalic and atraumatic.  Eyes:     Pupils: Pupils are equal, round, and reactive to light.  Cardiovascular:     Rate and Rhythm: Normal rate and regular rhythm.     Heart sounds: Normal heart sounds.  Pulmonary:     Effort: Pulmonary effort is normal. No respiratory distress.     Breath sounds: Decreased breath sounds and wheezing present. No rales.  Chest:     Chest wall: No tenderness.  Abdominal:     General: Bowel sounds are normal.     Palpations: Abdomen is soft.     Tenderness: There is no abdominal tenderness. There is no guarding or rebound.  Musculoskeletal:        General: Normal range of motion.     Cervical back: Normal range of motion and neck supple.     Comments: 1+ edema to lower extremities bilaterally, no calf tenderness  Lymphadenopathy:     Cervical: No cervical adenopathy.  Skin:    General: Skin is warm and dry.     Findings: No rash.  Neurological:     Mental Status: She is alert and oriented to person, place, and time.     (all labs ordered are listed, but only abnormal results are displayed) Labs Reviewed  BASIC METABOLIC PANEL WITH GFR - Abnormal; Notable for the following components:      Result Value   BUN 37 (*)    Creatinine, Ser 1.38 (*)    GFR, Estimated 39 (*)    All other components within normal limits  CBC - Abnormal; Notable for the following components:   RBC 3.38 (*)    Hemoglobin 10.1 (*)    HCT 30.9 (*)    All other components within normal limits  PRO BRAIN NATRIURETIC PEPTIDE - Abnormal; Notable for the following components:    Pro Brain Natriuretic Peptide 414.0 (*)    All other components within normal limits  RESP PANEL BY RT-PCR (RSV, FLU A&B, COVID)  [id number]    EKG: None  Radiology: DG Chest 2 View Result Date: 08/16/2024 CLINICAL DATA:  Shortness of breath EXAM: CHEST - 2 VIEW COMPARISON:  05/19/2024 FINDINGS: Diffuse bronchitic changes. Chronic elevation left diaphragm. Stable cardiomediastinal silhouette. No pleural effusion or pneumothorax. IMPRESSION: Diffuse bronchitic changes. Electronically Signed   By: Luke Bun M.D.   On: 08/16/2024 19:27  Procedures   Medications Ordered in the ED  doxycycline  (VIBRA -TABS) tablet 100 mg (has no administration in time range)  ipratropium-albuterol  (DUONEB) 0.5-2.5 (3) MG/3ML nebulizer solution 3 mL (3 mLs Nebulization Given 08/16/24 2042)  predniSONE  (DELTASONE ) tablet 60 mg (60 mg Oral Given 08/16/24 2042)                                    Medical Decision Making Amount and/or Complexity of Data Reviewed Labs: ordered. Radiology: ordered.  Risk Prescription drug management.   This patient presents to the ED for concern of shortness of breath, this involves an extensive number of treatment options, and is a complaint that carries with it a high risk of complications and morbidity.  I considered the following differential and admission for this acute, potentially life threatening condition.  The differential diagnosis includes asthma/COPD exacerbation, pulmonary edema, PE, pneumonia, bronchitis, ACS  MDM:    Patient is a 78 year old who presents with wheezing and shortness of breath.  Symptoms are consistent with prior COPD exacerbations.  She does not have increased pedal edema or other signs of fluid overload.  Her BNP is mildly elevated.  EKG does not show any ischemic changes.  No associated chest discomfort.  She was given nebulizer treatment and dose of steroids and her symptoms resolved.  Her lung exam is improved with diminished wheezing  and better air movement.  She has no hypoxia.  No increased work of breathing.  No other symptoms that would be more concerning for PE.  Labs reviewed and are nonconcerning.  Chest x-ray does not show any evidence of pneumonia.  She was discharged home in good condition.  She has albuterol  inhaler to use at home.  Will discharge her with a short course of prednisone  as well as doxycycline .  She was encouraged to have close follow-up with her PCP.  Return precautions were given.  (Labs, imaging, consults)  Labs: I Ordered, and personally interpreted labs.  The pertinent results include: Mildly elevated BNP, mildly low hemoglobin similar to prior values, normal electrolytes, creatinine mildly elevated but similar to prior values  Imaging Studies ordered: I ordered imaging studies including chest x-ray I independently visualized and interpreted imaging. I agree with the radiologist interpretation  Additional history obtained from chart.  External records from outside source obtained and reviewed including prior notes  Cardiac Monitoring: The patient was maintained on a cardiac monitor.  If on the cardiac monitor, I personally viewed and interpreted the cardiac monitored which showed an underlying rhythm of: Sinus tachycardia  Reevaluation: After the interventions noted above, I reevaluated the patient and found that they have :improved  Social Determinants of Health:  non-smoker  Disposition: Discharged to home  Co morbidities that complicate the patient evaluation  Past Medical History:  Diagnosis Date   Abnormal TSH 12/16/2015   Allergic rhinitis 12/10/2015   Anemia    Arachnoid cyst    Arcus senilis of both eyes 12/10/2015   Atopic dermatitis    Blood transfusion without reported diagnosis    Cardiac murmur 12/07/2022   Carpal tunnel syndrome    Chest pain 06/13/2023   Chronic heart failure with preserved ejection fraction (HFpEF) (HCC) 06/12/2023   Chronic vulvitis    CKD  stage 3b, 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   Cortical age-related cataract of both eyes 04/27/2018   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   Edema 12/10/2015   Gastroesophageal reflux disease  without esophagitis 06/08/2022   H/O bladder infections    History of chicken pox    Hypercholesterolemia    Hyperlipidemia 12/10/2015   Hyperopia with astigmatism and presbyopia, bilateral 12/10/2015   Hypertension    Hypo-osmolality and hyponatremia 01/08/2022   Hypomagnesemia 01/03/2022   Hyponatremia 01/02/2022   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 04/27/2018   Medication intolerance 06/13/2023   Mild intermittent asthma 12/16/2015   Morbid obesity (HCC) 12/07/2022   Need for prophylactic vaccination with Streptococcus pneumoniae (Pneumococcus) and Influenza vaccines 12/16/2015   Normocytic anemia 01/03/2022   Nuclear sclerotic cataract of both eyes 04/25/2017   Obese    Palpitations 06/11/2023   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 CT Abdomen/Pelvis from 01/02/22 showed small right renal midpole low density cyst, appearing benign.      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 One po bid x 7 days    Dispense:  14 capsule    Refill:  0    I have reviewed the patients home medicines and have  made adjustments as needed  Problem List / ED Course: Problem List Items Addressed This Visit   None Visit Diagnoses       COPD exacerbation (HCC)    -  Primary   Relevant Medications   ipratropium-albuterol  (DUONEB) 0.5-2.5 (3) MG/3ML nebulizer solution 3 mL (Completed)   predniSONE  (DELTASONE ) tablet 60 mg (Completed)   predniSONE  (DELTASONE ) 20 MG tablet                Final diagnoses:  COPD exacerbation [facility])    ED Discharge Orders          Ordered    predniSONE  (DELTASONE ) 20 MG tablet        08/16/24 2301    doxycycline  (VIBRAMYCIN ) 100 MG capsule  2 times daily        08/16/24 2301               Lenor Hollering, MD 08/16/24 2306

## 2024-08-30 ENCOUNTER — Ambulatory Visit (HOSPITAL_BASED_OUTPATIENT_CLINIC_OR_DEPARTMENT_OTHER): Admitting: Cardiovascular Disease

## 2024-08-30 ENCOUNTER — Encounter (HOSPITAL_BASED_OUTPATIENT_CLINIC_OR_DEPARTMENT_OTHER): Payer: Self-pay | Admitting: Cardiovascular Disease

## 2024-08-30 VITALS — BP 160/90 | HR 70 | Ht 65.0 in | Wt 268.3 lb

## 2024-08-30 DIAGNOSIS — N1832 Chronic kidney disease, stage 3b: Secondary | ICD-10-CM

## 2024-08-30 DIAGNOSIS — E782 Mixed hyperlipidemia: Secondary | ICD-10-CM

## 2024-08-30 DIAGNOSIS — R0602 Shortness of breath: Secondary | ICD-10-CM

## 2024-08-30 DIAGNOSIS — I5032 Chronic diastolic (congestive) heart failure: Secondary | ICD-10-CM | POA: Diagnosis not present

## 2024-08-30 DIAGNOSIS — I10 Essential (primary) hypertension: Secondary | ICD-10-CM | POA: Diagnosis not present

## 2024-08-30 DIAGNOSIS — I1A0 Resistant hypertension: Secondary | ICD-10-CM

## 2024-08-30 MED ORDER — FLUTICASONE PROPIONATE 50 MCG/ACT NA SUSP: 1.0000 | Freq: Every day | NASAL | 0 refills | Status: AC | PRN

## 2024-08-30 MED ORDER — ALBUTEROL SULFATE HFA 108 (90 BASE) MCG/ACT IN AERS: 1.0000 | INHALATION_SPRAY | Freq: Four times a day (QID) | RESPIRATORY_TRACT | 0 refills | Status: AC | PRN

## 2024-08-30 NOTE — Progress Notes (Signed)
 [facility] Follow-up:    Date:  08/30/2024   ID:  Jenny Martin, DOB Jan 07, 1946, MRN [id number]  PCP:  Almarie Waddell NOVAK, NP  Cardiologist:  Annabella Scarce, MD   Referring MD: Almarie Waddell NOVAK, NP   CC: Hypertension  History of Present Illness:    Jenny Martin is a 78 y.o. female with a hx of hypertension, hyperlipidemia, anemia, GERD, asthma, pneumonia, carpal tunnel syndrome, and obesity, here for follow-up.  She first established care in the [facility] 01/2023. On 12/02/2022 she presented to the ED with complaints of chest pain and shortness of breath. She noted that she had taken her first dose of carvedilol  the prior evening. She woke up with chest tightness and was hypertensive between 180 and 214 systolic. Her blood pressure on arrival was in the 190s but improved to the 140s on its own when brought to the exam room. Repeat troponin was normal. Urinalysis noted microscopic hematuria without infection. Her BP trended up to the 170s; she was given hydralazine  10 mg IV and Lasix  10 mg IV for peripheral edema. She returned to the ED the following day complaining of headaches. She wished to discontinue carvedilol  although she was advised that headaches would likely resolve after 1-2 weeks on the medication. She was referred to cardiology for outpatient workup and treatment of her high blood pressure.   She was seen by Dr. Edwyna 12/07/2022 and her BP was 180/73. Her EKG showed NSR and nonspecific ST-T changes. She was only taking 20 mg furosemide  daily. It was noted that she had known intolerance to multiple antihypertensives. She had wheezing with beta-blocker such as carvedilol . With ACE inhibitors she developed swelling and hyponatremia. Clonidine  0.1 mg BID was started. Renal artery dopplers 11/2022 showed no evidence of renal artery stenosis; a cyst was noted in the right upper pole. A murmur was heard on exam; she had an echocardiogram 12/2022 revealing  LVEF 60-65%, mild LVH, and grade 1 diastolic dysfunction. There was mild mitral valve regurgitation and no evidence of valvular stenosis.  On 12/16/22 she presented to the ED with complaints of worsening LE edema, SOB, chest tightness, and tingling sensation of her RUE. She also complained of dry mouth and neck rash from clonidine . She had stopped her Lasix  about 1 week prior as it was ineffective. CBC and BMP with stable counts, BNP 101.6, and troponin x2 negative. EKG without ischemia, infarction, or arrhythmia. She was instructed to take her clonidine  when arriving home and restart her Lasix . The next day she returned to the ED with similar complaints and was found to have hyponatremia to 131. It was felt her symptoms may have had a psychosomatic component. Her clonidine  was discontinued and switched to irbesartan . She reported a prior adverse reaction of rash on telmisartan . She again was seen in the ED the following day with hypertensive concerns and was given a trial of bisoprolol  5 mg. On 12/20/22 in the ED she was hypertensive and requested a new medication, but they recommended follow up with her primary providers given her multiple intolerances. She saw her PCP 12/23/22 and her BP was 148/63. They started amlodipine  2.5 mg. She went back to the ED 12/27/22 with cough and Covid-19. She was hypertensive but no changes were made at that time. She saw her PCP 2/23 and blood pressure remained elevated, but they recommended waiting for cardiology follow-up. She followed up with Dr. Edwyna 01/11/2023 and her BP was 154/68. She had stopped taking her  midcations. She was instructed to take amlodipine  2.5 mg BID.  At her initial visit she was started on spironolactone .  She followed up with Reche Finder, NP. She had been prescribed doxazosin  but was afraid to take it due to concern about potential dizziness.  Amlodipine  was increased.  She has struggled with multiple medication intolerances and frequently stops her  medications soon after initiating a new medication.  At her visit 04/2023 blood pressure was over 200 systolic.  She did agree to try low-dose doxazosin .  She was hospitalized 05/2023 with palpitations and was found to have sinus tachycardia.  Blood pressures were remained uncontrolled.  She started on chlorthalidone  and both amlodipine  and clonidine  were discontinued due to potential side effects but she remained uncontrolled and was not interested in trying any other medications.  She wore an ambulatory monitor that revealed 52 runs of SVT and was recommended to start metoprolol .  At her visit 07/2023 she reported that she had tried half a tablet of metoprolol , but then stopped it after discovering a prior lawsuit online and potentially significant side effects. We discussed this at length, with reassurance. In the office her blood pressure was elevated to 198/85 initially, and 182/68 on manual recheck. She noted that she had tolerated triamterene-HCTZ in the past and wondered if she should retrial this.  She also noted that she was feeling very stressed and anxious.  She did agree to try metoprolol  and she was encouraged to work with her provider for stress and anxiety management.  At her visit 08/2023 Home blood pressures were averaging in the 140s though in the office it was 171/71.  We discussed limiting sodium intake and increase chlorthalidone .  She saw Reche Finder, NP 10/2023 and reported that home blood pressures were improved, though it was still elevated in the office.  She called the office 03/2024 reporting new side effects to all of her antihypertensives.  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  Despite our pharmacist reassurance, chlorthalidone  and metoprolol  were both reduced.  She then became frustrated that her blood pressure remained elevated.  She was started on nifedipine  and developed swelling.  She was switched  from metoprolol  to bisoprolol  and encouraged to reduce her sodium intake.  Chlorthalidone  was increased back to 37.5 mg.  At follow up 05/2024 she reported symptoms 2/2 chlorthalidone .  She was started on verapamil  2/2 palpitations and Zetia  was thought to be causing her symptoms so it was discontinued.   Discussed the use of AI scribe software for clinical note transcription with the patient, who gave verbal consent to proceed.  History of Present Illness Ms. Blunck experienced an asthma exacerbation a few weeks ago, leading to an emergency room visit due to difficulty breathing. She had received a flu shot prior to this episode and suspects it may have contributed to her symptoms. In the emergency room, she was treated with a breathing treatment, prednisone , and antibiotics, which improved her condition. Currently, she manages her respiratory symptoms with hibiscus tea, cloves, and chlorothalidone, and feels stable. She occasionally experiences asthma flare-ups but notes that they have not occurred recently.  She has a history of palpitations and was previously on verapamil , which she is no longer taking. She experiences episodes of her heart 'speeding up' but finds that drinking cold water helps to settle it down. Her blood pressure has been running around 140/70 mmHg at home, but it increases with activity or  stress, such as coming to the clinic. She is currently taking chlorthalidone  in the morning and supplements with beetroot cubes and hibiscus tea throughout the day to help manage her blood pressure. She is not on Zetia  or minoxidil  anymore.  She acknowledges weight gain, attributing it to her fondness for cookies, and is aware that this may be affecting her blood pressure. She has a cycle machine at home that she plans to use more frequently while watching TV.  Previous antihypertensives: Beta-blockers (bisoprolol , carvedilol )-wheezing ACE-swelling, hyponatremia Lasix -hyponatremia (otherwise  tolerated) Hydrochlorothiazide-hyponatremia ARB (Telmisartan -rash), Losartan, Valsartan (made chest burn), Irbesartan  Clonidine -neck rash, dry mouth, SOB Hydralazine  Spironolactone  - swelling Amlodipine  Clonidine  Doxazosin  Nifedipine  - one tablet caused lip swelling and nausea Minoxidil    Past Medical History:  Diagnosis Date   Abnormal TSH 12/16/2015   Allergic rhinitis 12/10/2015   Anemia    Arachnoid cyst    Arcus senilis of both eyes 12/10/2015   Atopic dermatitis    Blood transfusion without reported diagnosis    Cardiac murmur 12/07/2022   Carpal tunnel syndrome    Chest pain 06/13/2023   Chronic heart failure with preserved ejection fraction (HFpEF) (HCC) 06/12/2023   Chronic vulvitis    CKD stage 3b, 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   Cortical age-related cataract of both eyes 04/27/2018   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   Edema 12/10/2015   Gastroesophageal reflux disease without esophagitis 06/08/2022   H/O bladder infections    History of chicken pox    Hypercholesterolemia    Hyperlipidemia 12/10/2015   Hyperopia with astigmatism and presbyopia, bilateral 12/10/2015   Hypertension    Hypo-osmolality and hyponatremia 01/08/2022   Hypomagnesemia 01/03/2022   Hyponatremia 01/02/2022   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 04/27/2018   Medication intolerance 06/13/2023   Mild intermittent asthma 12/16/2015   Morbid obesity (HCC) 12/07/2022   Need for prophylactic vaccination with Streptococcus pneumoniae (Pneumococcus) and Influenza vaccines 12/16/2015   Normocytic anemia 01/03/2022   Nuclear sclerotic cataract of both eyes 04/25/2017   Obese    Palpitations 06/11/2023   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 CT Abdomen/Pelvis from 01/02/22 showed small right renal midpole low density cyst, appearing  benign.      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   albuterol  (VENTOLIN  HFA) 108 (90 Base) MCG/ACT inhaler INHALE 1-2 PUFFS INTO THE LUNGS EVERY 6 HOURS AS NEEDED FOR WHEEZING OR SHORTNESS OF BREATH   aspirin  325 MG tablet Take 325 mg by mouth every 6 (six) hours as needed for mild pain or moderate pain.   chlorthalidone  (HYGROTON ) 25 MG tablet Take 1 tablet (25 mg total) by mouth daily.   ferrous sulfate  325 (65 FE) MG tablet Take 1 tablet (325 mg total) by mouth every other day.   furosemide  (LASIX ) 40 MG tablet Take 0.5 tablets (20 mg total) by mouth 2 (two) times daily as needed for edema or fluid.  Magnesium  500 MG TABS Take 1 tablet by mouth every other day.   Misc Natural Products (BEET ROOT PO) Take 2 chews by mouth once daily   Multiple Vitamins-Minerals (CENTRUM MINIS ADULTS 50+) TABS Take 1 tablet by mouth daily in the afternoon.   NON FORMULARY hibiscus tea with cloves daily   triamcinolone  cream (KENALOG ) 0.1 % Apply 1 Application topically 2 (two) times daily as needed (for atopic dermatitis- affected areas).   VITAMIN D, ERGOCALCIFEROL, PO Take 1 capsule by mouth daily.     Allergies:   Clonidine  derivatives, Hydralazine , Atorvastatin, Bisoprolol , Hydrochlorothiazide, Losartan, Micardis  hct [telmisartan -hctz], Pravastatin, Statins, Telmisartan , Valsartan-hydrochlorothiazide, and Ibuprofen   Social History   Socioeconomic History   Marital status: Divorced    Spouse name: Not on file   Number of children: Not on file   Years of education: Not on file    Highest education level: Not on file  Occupational History   Not on file  Tobacco Use   Smoking status: Never    Passive exposure: Past (caused lung issues)   Smokeless tobacco: Never  Vaping Use   Vaping status: Never Used  Substance and Sexual Activity   Alcohol use: No   Drug use: No   Sexual activity: Never  Other Topics Concern   Not on file  Social History Narrative   Not on file   Social Drivers of Health   Financial Resource Strain: Low Risk  (04/18/2024)   Overall Financial Resource Strain (CARDIA)    Difficulty of Paying Living Expenses: Not hard at all  Food Insecurity: No Food Insecurity (04/18/2024)   Hunger Vital Sign    Worried About Running Out of Food in the Last Year: Never true    Ran Out of Food in the Last Year: Never true  Transportation Needs: No Transportation Needs (04/18/2024)   PRAPARE - Administrator, Civil Service (Medical): No    Lack of Transportation (Non-Medical): No  Physical Activity: Insufficiently Active (04/18/2024)   Exercise Vital Sign    Days of Exercise per Week: 5 days    Minutes of Exercise per Session: 20 min  Stress: No Stress Concern Present (04/18/2024)   [employer] of Occupational Health - Occupational Stress Questionnaire    Feeling of Stress : Not at all  Social Connections: Moderately Integrated (04/18/2024)   Social Connection and Isolation Panel    Frequency of Communication with Friends and Family: More than three times a week    Frequency of Social Gatherings with Friends and Family: More than three times a week    Attends Religious Services: More than 4 times per year    Active Member of [employer] or Organizations: Yes    Attends Engineer, Structural: More than 4 times per year    Marital Status: Divorced     Family History: The patient's family history includes Asthma in her mother; COPD in her mother; Cancer in her maternal grandmother; Hypertension in her father; Stroke in her father.  ROS:    Please see the history of present illness.    (+) Stress (+) Right LE pain All other systems reviewed and are negative.  EKGs/Labs/Other Studies Reviewed:    Monitor 06/2023: Patch Wear Time:  13 days and 15 hours (2024-08-01T15:38:27-0400 to 2024-08-15T07:36:38-0400)   Patient had a min HR of 57 bpm, max HR of 171 bpm, and avg HR of 75 bpm. Predominant underlying rhythm was Sinus Rhythm. Intermittent Bundle Branch Block was present.  52 Supraventricular Tachycardia runs occurred, the run with the fastest interval lasting 5 beats with a max rate of 171 bpm, the longest lasting 19.2 secs with an avg rate of 124 bpm. Supraventricular Tachycardia was detected within +/- 45 seconds of symptomatic patient event(s). Isolated SVEs were rare (<1.0%), SVE Couplets were rare (<1.0%), and no SVE Triplets were present.    Isolated VEs were rare (<1.0%), VE Couplets were rare (<1.0%), and no VE Triplets were present.  Echocardiogram 12/22/2022: Sonographer Comments: Patient is obese. Restricted mobility, study  performed in Fowler's position.   IMPRESSIONS   1. GLS -19.4. Left ventricular ejection fraction, by estimation, is 60 to  65%. The left ventricle has normal function. The left ventricle has no  regional wall motion abnormalities. There is mild left ventricular  hypertrophy. Left ventricular diastolic  parameters are consistent with Grade I diastolic dysfunction (impaired  relaxation).   2. Right ventricular systolic function is normal. The right ventricular  size is normal.   3. The mitral valve is normal in structure. Mild mitral valve  regurgitation. No evidence of mitral stenosis.   4. The aortic valve is normal in structure. Aortic valve regurgitation is  not visualized. No aortic stenosis is present.   5. The inferior vena cava is normal in size with greater than 50%  respiratory variability, suggesting right atrial pressure of 3 mmHg.   Bilateral Renal Artery Dopplers   12/15/2022: Summary:  Largest Aortic Diameter: 2.3 cm    Renal:  Right: Cyst(s) noted. RRV flow present. Normal size right kidney.         Abnormal right Resistive Index. Normal cortical thickness of         right kidney. No evidence of right renal artery stenosis.  Left:  Normal size of left kidney. Abnormal left Resisitve Index.         Normal cortical thickness of the left kidney. LRV flow         present. No evidence of left renal artery stenosis.  Mesenteric:  Normal Celiac artery and Superior Mesenteric artery findings.    EKG:  EKG is personally reviewed. 08/17/2023: Not ordered. 07/26/2023:  Not ordered. 02/02/2023:  Not ordered.  Recent Labs: 05/19/2024: ALT 18 08/16/2024: BUN 37; Creatinine, Ser 1.38; Hemoglobin 10.1; Platelets 211; Potassium 4.3; Pro Brain Natriuretic Peptide 414.0; Sodium 141   Recent Lipid Panel No results found for: CHOL, TRIG, HDL, CHOLHDL, VLDL, LDLCALC, LDLDIRECT  Physical Exam:    VS:  BP (!) 160/90 (BP Location: Right Arm, Patient Position: Sitting, Cuff Size: Large)   Pulse 70   Ht 5' 5 (1.651 m)   Wt 268 lb 4.8 oz (121.7 kg)   SpO2 97%   BMI 44.65 kg/m  , BMI Body mass index is 44.65 kg/m. GENERAL:  Well appearing HEENT: Pupils equal round and reactive, fundi not visualized, oral mucosa unremarkable NECK:  No jugular venous distention, waveform within normal limits, carotid upstroke brisk and symmetric, no bruits, no thyromegaly LUNGS:  Clear to auscultation bilaterally HEART:  RRR.  PMI not displaced or sustained,S1 and S2 within normal limits, no S3, no S4, no clicks, no rubs, II/VI systolic murmur at the LUSB ABD:  Flat, positive bowel sounds normal in frequency in pitch, no bruits, no rebound, no guarding, no midline pulsatile mass, no hepatomegaly, no splenomegaly EXT:  2 plus pulses throughout, no LE edema, no cyanosis, no clubbing SKIN:  No rashes, no nodules NEURO:  Cranial nerves II through XII grossly intact, motor  grossly intact throughout [facility]:  Cognitively intact, oriented to person place and time   ASSESSMENT/PLAN:    Assessment & Plan # Essential hypertension with stage 3b chronic kidney disease Hypertension managed with chlorthalidone  and lifestyle modifications. Home readings improved but above target. Renal denervation discussed as non-pharmacological option. Target BP <130/80 mmHg to reduce cardiovascular and renal risks. - Continue chlorthalidone . - Consider renal denervation; provide informational booklet. - Encourage lifestyle modifications including exercise and weight loss. - Recheck blood pressure in four months.  # Chronic diastolic heart failure Management likely includes controlling blood pressure and addressing contributing factors such as obesity and hyperlipidemia.  # Mixed hyperlipidemia No current lipid-lowering medications. Emphasized exercise and weight management for cholesterol control. - Recheck cholesterol levels in four months with fasting lab work.  # Obesity Weight gain linked to dietary habits, particularly cookie consumption. Discussed impact on blood pressure and cardiovascular health. - Encourage reduction in cookie consumption, possibly limiting to twice a week or after achieving 10,000 steps. - Increase physical activity, aiming for 10,000 steps daily.  # Palpitations Previously managed with verapamil , now managed with hydration and lifestyle modifications. - Discontinue verapamil .    Screening for Secondary Hypertension:      02/02/2023    9:45 AM  Causes  Drugs/Herbals Screened     - Comments No EtOH.  No NSAIDs.  Tries to limit sodium.  1 coffee daily  Renovascular HTN Screened     - Comments Normal 11/2022  Sleep Apnea N/A     - Comments no snoring  Thyroid Disease Screened     - Comments normal 07/2022  Hyperaldosteronism N/A  Pheochromocytoma N/A  Cushing's Syndrome N/A  Hyperparathyroidism N/A  Coarctation of the Aorta Screened   Compliance Screened     - Comments BP higher R>L    Relevant Labs/Studies:    Latest Ref Rng & Units 08/16/2024    8:33 PM 05/30/2024    9:46 AM 05/19/2024    4:31 PM  Basic Labs  Sodium 135 - 145 mmol/L 141  136  136   Potassium 3.5 - 5.1 mmol/L 4.3  4.6  4.0   Creatinine 0.44 - 1.00 mg/dL 8.61  8.47  8.31                    12/15/2022    9:41 AM  Renovascular   Renal Artery US  Completed Yes      Disposition:    FU in [facility]pert[facility] Clinic in 4 months.  Medication Adjustments/Labs and Tests Ordered: Current medicines are reviewed at length with the patient today.  Concerns regarding medicines are outlined above.   No orders of the defined types were placed in this encounter.  No orders of the defined types were placed in this encounter.    Signed, Annabella Scarce, MD  08/30/2024 12:11 PM    [facility]

## 2024-08-30 NOTE — Patient Instructions (Addendum)
 Medication Instructions:  Your physician recommends that you continue on your current medications as directed. Please refer to the Current Medication list given to you today.   Labwork: FASTING LP/CMET IN 4 MONTHS PRIOR TO FOLLOW UP   Testing/Procedures: NONE  Follow-Up: 4 MONTHS WITH CAITLIN W NP OR DR [place]   If you need a refill on your cardiac medications before your next appointment, please call your pharmacy.

## 2024-09-04 ENCOUNTER — Other Ambulatory Visit (HOSPITAL_BASED_OUTPATIENT_CLINIC_OR_DEPARTMENT_OTHER): Payer: Self-pay | Admitting: Family Medicine

## 2024-09-04 DIAGNOSIS — Z139 Encounter for screening, unspecified: Secondary | ICD-10-CM

## 2024-09-18 ENCOUNTER — Encounter (HOSPITAL_BASED_OUTPATIENT_CLINIC_OR_DEPARTMENT_OTHER): Payer: Self-pay | Admitting: Cardiovascular Disease

## 2024-10-18 ENCOUNTER — Encounter (HOSPITAL_BASED_OUTPATIENT_CLINIC_OR_DEPARTMENT_OTHER): Payer: Self-pay

## 2024-10-18 ENCOUNTER — Inpatient Hospital Stay (HOSPITAL_BASED_OUTPATIENT_CLINIC_OR_DEPARTMENT_OTHER)
Admission: RE | Admit: 2024-10-18 | Discharge: 2024-10-18 | Disposition: A | Source: Ambulatory Visit | Attending: Family Medicine

## 2024-10-18 DIAGNOSIS — Z139 Encounter for screening, unspecified: Secondary | ICD-10-CM

## 2024-10-24 ENCOUNTER — Encounter: Payer: Self-pay | Admitting: Cardiology

## 2024-10-24 ENCOUNTER — Ambulatory Visit: Attending: Cardiology | Admitting: Cardiology

## 2024-10-24 VITALS — BP 168/88 | HR 90 | Ht 65.0 in | Wt 268.0 lb

## 2024-10-24 DIAGNOSIS — I10 Essential (primary) hypertension: Secondary | ICD-10-CM | POA: Diagnosis not present

## 2024-10-24 DIAGNOSIS — N1832 Chronic kidney disease, stage 3b: Secondary | ICD-10-CM

## 2024-10-24 MED ORDER — AMLODIPINE BESYLATE 10 MG PO TABS: 10.0000 mg | ORAL_TABLET | Freq: Every day | ORAL | 3 refills | Status: AC

## 2024-10-24 NOTE — Progress Notes (Signed)
 Cardiology Office Note:    Date:  10/24/2024   ID:  Jenny Martin, DOB Jun 30, 1946, MRN [id number]  PCP:  Almarie Waddell NOVAK, NP  Cardiologist:  Jennifer JONELLE Crape, MD   Referring MD: Almarie Waddell NOVAK, NP    ASSESSMENT:    1. Uncontrolled hypertension   2. CKD stage 3b, GFR 30-44 ml/min (HCC)   3. Morbid obesity (HCC)    PLAN:    In order of problems listed above:  Primary prevention stressed with the patient.  Importance of compliance with diet medication stressed and patient verbalized standing. She was advised to ambulate to the best of her ability. Uncontrolled hypertension: I would like to switch her medicine.  She is taking furosemide  on a as needed basis and also uses chlorthalidone .  She is agreeable.  I stopped chlorthalidone  and started amlodipine  10 mg daily.  She will keep a track of her pulse blood pressure and get back to us  in 1 to 2 weeks.  At that time she will have a nurse visit, vital sign check and Chem-7 at her office.  Salt intake issues, diet and lifestyle modification emphasized and she promises to do better. Renal insufficiency: Stable and we will continue to monitor. Morbid obesity: Weight reduction stressed diet emphasized use of obesity explained and she promises to do better. Patient will be seen in follow-up appointment in 6 months or earlier if the patient has any concerns.    Medication Adjustments/Labs and Tests Ordered: Current medicines are reviewed at length with the patient today.  Concerns regarding medicines are outlined above.  No orders of the defined types were placed in this encounter.  No orders of the defined types were placed in this encounter.    No chief complaint on file.    History of Present Illness:    Jenny Martin is a 78 y.o. female.  Patient has past medical history of essential hypertension, mixed dyslipidemia and morbid obesity.  She has renal insufficiency.  She denies any problems at this time and takes care of  activities of daily living.  She leads a sedentary lifestyle.  At the time of my evaluation, the patient is alert awake oriented and in no distress.  Past Medical History:  Diagnosis Date   Abnormal TSH 12/16/2015   Allergic rhinitis 12/10/2015   Anemia    Arachnoid cyst    Arcus senilis of both eyes 12/10/2015   Atopic dermatitis    Blood transfusion without reported diagnosis    Cardiac murmur 12/07/2022   Carpal tunnel syndrome    Chest pain 06/13/2023   Chronic heart failure with preserved ejection fraction (HFpEF) (HCC) 06/12/2023   Chronic vulvitis    CKD stage 3b, 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   Cortical age-related cataract of both eyes 04/27/2018   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   Edema 12/10/2015   Gastroesophageal reflux disease without esophagitis 06/08/2022   History of chicken pox    Hypercholesterolemia    Hyperlipidemia 12/10/2015   Hyperopia with astigmatism and presbyopia, bilateral 12/10/2015   Hypertension    Hypo-osmolality and hyponatremia 01/08/2022   Hypomagnesemia 01/03/2022   Hyponatremia 01/02/2022   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 04/27/2018   Medication intolerance 06/13/2023   Mild intermittent asthma 12/16/2015   Morbid obesity (HCC) 12/07/2022   Need for prophylactic vaccination with Streptococcus pneumoniae (Pneumococcus) and Influenza vaccines 12/16/2015   Normocytic anemia 01/03/2022   Nuclear sclerotic cataract of both eyes 04/25/2017   Obese  Palpitations 06/11/2023   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 CT Abdomen/Pelvis from 01/02/22 showed small right renal midpole low density cyst, appearing benign.      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   albuterol  (VENTOLIN  HFA) 108 (90 Base) MCG/ACT inhaler Inhale 1-2 puffs into the lungs every 6 (six) hours as needed for wheezing or shortness of breath.   aspirin  325 MG tablet Take 325 mg by mouth every 6 (six) hours as needed for mild pain or moderate pain.   chlorthalidone  (HYGROTON ) 25 MG tablet Take 1 tablet (25 mg total) by mouth daily.   ferrous sulfate  325 (65 FE) MG tablet Take 1 tablet (325 mg total) by mouth every other day.   fluticasone  (FLONASE ) 50 MCG/ACT nasal spray Place 1 spray into both nostrils daily as needed for allergies or rhinitis.   furosemide  (LASIX ) 40 MG tablet Take 0.5 tablets (20 mg total) by mouth 2 (two) times daily as needed for edema or fluid.   Magnesium  500 MG TABS Take 1 tablet by mouth every other day.   Misc Natural Products (BEET ROOT PO) Take 2 chews by mouth once daily   Multiple Vitamins-Minerals (CENTRUM MINIS ADULTS 50+) TABS Take 1 tablet by mouth daily in the afternoon.   NON FORMULARY hibiscus tea with cloves daily   triamcinolone  cream (KENALOG ) 0.1 % Apply 1 Application topically 2 (two) times daily as needed (for atopic dermatitis- affected areas).   VITAMIN D, ERGOCALCIFEROL, PO Take 1 capsule by mouth every other day.     Allergies:   Clonidine  derivatives, Hydralazine , Atorvastatin, Bisoprolol , Hydrochlorothiazide, Losartan, Micardis  hct [telmisartan -hctz], Pravastatin, Statins, Telmisartan , Valsartan-hydrochlorothiazide, and Ibuprofen   Social History   Socioeconomic History   Marital status: Divorced    Spouse name: Not on file   Number of children: Not on file   Years of education: Not on file   Highest education level: Not on file   Occupational History   Not on file  Tobacco Use   Smoking status: Never    Passive exposure: Past (caused lung issues)   Smokeless tobacco: Never  Vaping Use   Vaping status: Never Used  Substance and Sexual Activity   Alcohol use: No   Drug use: No   Sexual activity: Never  Other Topics Concern   Not on file  Social History Narrative   Not on file   Social Drivers of Health   Financial Resource Strain: Low Risk  (04/18/2024)   Overall Financial Resource Strain (CARDIA)    Difficulty of Paying Living Expenses: Not hard at all  Food Insecurity: No Food Insecurity (04/18/2024)   Hunger Vital Sign    Worried About Running Out of Food in the Last Year: Never true    Ran Out of Food in the Last Year: Never true  Transportation Needs: No Transportation Needs (04/18/2024)   PRAPARE - Transportation    Lack of Transportation (  Medical): No    Lack of Transportation (Non-Medical): No  Physical Activity: Insufficiently Active (04/18/2024)   Exercise Vital Sign    Days of Exercise per Week: 5 days    Minutes of Exercise per Session: 20 min  Stress: No Stress Concern Present (04/18/2024)   [employer] of Occupational Health - Occupational Stress Questionnaire    Feeling of Stress : Not at all  Social Connections: Moderately Integrated (04/18/2024)   Social Connection and Isolation Panel    Frequency of Communication with Friends and Family: More than three times a week    Frequency of Social Gatherings with Friends and Family: More than three times a week    Attends Religious Services: More than 4 times per year    Active Member of [employer] or Organizations: Yes    Attends Engineer, Structural: More than 4 times per year    Marital Status: Divorced     Family History: The patient's family history includes Asthma in her mother; COPD in her mother; Cancer in her maternal grandmother; Hypertension in her father; Stroke in her father.  ROS:   Please see the history of present  illness.    All other systems reviewed and are negative.  EKGs/Labs/Other Studies Reviewed:    The following studies were reviewed today: .SABRA      Recent Labs: 05/19/2024: ALT 18 08/16/2024: BUN 37; Creatinine, Ser 1.38; Hemoglobin 10.1; Platelets 211; Potassium 4.3; Pro Brain Natriuretic Peptide 414.0; Sodium 141  Recent Lipid Panel No results found for: CHOL, TRIG, HDL, CHOLHDL, VLDL, LDLCALC, LDLDIRECT  Physical Exam:    VS:  BP (!) 176/88   Pulse 90   Ht 5' 5 (1.651 m)   Wt 268 lb 0.6 oz (121.6 kg)   SpO2 97%   BMI 44.60 kg/m     Wt Readings from Last 3 Encounters:  10/24/24 268 lb 0.6 oz (121.6 kg)  08/30/24 268 lb 4.8 oz (121.7 kg)  08/16/24 250 lb (113.4 kg)     GEN: Patient is in no acute distress HEENT: Normal NECK: No JVD; No carotid bruits LYMPHATICS: No lymphadenopathy CARDIAC: Hear sounds regular, 2/6 systolic murmur at the apex. RESPIRATORY:  Clear to auscultation without rales, wheezing or rhonchi  ABDOMEN: Soft, non-tender, non-distended MUSCULOSKELETAL:  No edema; No deformity  SKIN: Warm and dry NEUROLOGIC:  Alert and oriented x 3 PSYCHIATRIC:  Normal affect   Signed, Jennifer JONELLE Crape, MD  10/24/2024 10:39 AM    [facility]

## 2024-10-24 NOTE — Patient Instructions (Signed)
 Medication Instructions:  Your physician has recommended you make the following change in your medication:   Stop Chlorthalidone   Start Amlodipine  10 mg daily. Return in 1 week for a nurse visit and bring BP log to review and have labs.  *If you need a refill on your cardiac medications before your next appointment, please call your pharmacy*   Lab Work: None ordered If you have labs (blood work) drawn today and your tests are completely normal, you will receive your results only by: MyChart Message (if you have MyChart) OR A paper copy in the mail If you have any lab test that is abnormal or we need to change your treatment, we will call you to review the results.   Testing/Procedures: None ordered   Follow-Up: At [facility], you and your health needs are our priority.  As part of our continuing mission to provide you with exceptional heart care, we have created designated Provider Care Teams.  These Care Teams include your primary Cardiologist (physician) and Advanced Practice Providers (APPs -  Physician Assistants and Nurse Practitioners) who all work together to provide you with the care you need, when you need it.  We recommend signing up for the patient portal called MyChart.  Sign up information is provided on this After Visit Summary.  MyChart is used to connect with patients for Virtual Visits (Telemedicine).  Patients are able to view lab/test results, encounter notes, upcoming appointments, etc.  Non-urgent messages can be sent to your provider as well.   To learn more about what you can do with MyChart, go to [email or web address].    Your next appointment:   6 month(s)  The format for your next appointment:   In Person  Provider:   Jennifer Crape, MD    Other Instructions none  Important Information About Sugar

## 2024-10-25 ENCOUNTER — Encounter: Payer: Self-pay | Admitting: Cardiology

## 2024-10-30 ENCOUNTER — Ambulatory Visit

## 2025-01-04 ENCOUNTER — Encounter (HOSPITAL_BASED_OUTPATIENT_CLINIC_OR_DEPARTMENT_OTHER): Admitting: Cardiovascular Disease

## 2025-04-24 ENCOUNTER — Ambulatory Visit
